# Patient Record
Sex: Female | Born: 1985
Health system: Southern US, Community
[De-identification: ages and names within clinical notes are randomized; demographics above are authoritative.]

## PROBLEM LIST (undated history)

## (undated) DIAGNOSIS — F329 Major depressive disorder, single episode, unspecified: Secondary | ICD-10-CM

## (undated) DIAGNOSIS — R51 Headache: Secondary | ICD-10-CM

## (undated) DIAGNOSIS — R519 Headache, unspecified: Secondary | ICD-10-CM

## (undated) DIAGNOSIS — O24419 Gestational diabetes mellitus in pregnancy, unspecified control: Secondary | ICD-10-CM

## (undated) DIAGNOSIS — M1990 Unspecified osteoarthritis, unspecified site: Secondary | ICD-10-CM

## (undated) DIAGNOSIS — K219 Gastro-esophageal reflux disease without esophagitis: Secondary | ICD-10-CM

## (undated) DIAGNOSIS — R7303 Prediabetes: Secondary | ICD-10-CM

## (undated) DIAGNOSIS — F319 Bipolar disorder, unspecified: Secondary | ICD-10-CM

## (undated) DIAGNOSIS — F419 Anxiety disorder, unspecified: Secondary | ICD-10-CM

## (undated) DIAGNOSIS — F32A Depression, unspecified: Secondary | ICD-10-CM

## (undated) HISTORY — DX: Bipolar disorder, unspecified: F31.9

## (undated) HISTORY — DX: Gestational diabetes mellitus in pregnancy, unspecified control: O24.419

## (undated) HISTORY — PX: WISDOM TOOTH EXTRACTION: SHX21

---
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
## 2008-01-21 ENCOUNTER — Emergency Department (HOSPITAL_COMMUNITY): Admission: EM | Admit: 2008-01-21 | Discharge: 2008-01-21 | Payer: Self-pay | Admitting: Emergency Medicine

## 2008-08-08 ENCOUNTER — Encounter: Admission: RE | Admit: 2008-08-08 | Discharge: 2008-08-08 | Payer: Self-pay | Admitting: Obstetrics and Gynecology

## 2008-09-19 ENCOUNTER — Inpatient Hospital Stay (HOSPITAL_COMMUNITY): Admission: RE | Admit: 2008-09-19 | Discharge: 2008-09-22 | Payer: Self-pay | Admitting: Obstetrics and Gynecology

## 2010-05-10 LAB — GLUCOSE, CAPILLARY
Glucose-Capillary: 127 mg/dL — ABNORMAL HIGH (ref 70–99)
Glucose-Capillary: 158 mg/dL — ABNORMAL HIGH (ref 70–99)
Glucose-Capillary: 70 mg/dL (ref 70–99)
Glucose-Capillary: 73 mg/dL (ref 70–99)
Glucose-Capillary: 79 mg/dL (ref 70–99)
Glucose-Capillary: 81 mg/dL (ref 70–99)
Glucose-Capillary: 85 mg/dL (ref 70–99)
Glucose-Capillary: 88 mg/dL (ref 70–99)
Glucose-Capillary: 94 mg/dL (ref 70–99)

## 2010-05-10 LAB — CBC
HCT: 27.7 % — ABNORMAL LOW (ref 36.0–46.0)
HCT: 34.3 % — ABNORMAL LOW (ref 36.0–46.0)
HCT: 34.7 % — ABNORMAL LOW (ref 36.0–46.0)
Hemoglobin: 11.5 g/dL — ABNORMAL LOW (ref 12.0–15.0)
Hemoglobin: 11.6 g/dL — ABNORMAL LOW (ref 12.0–15.0)
Hemoglobin: 9.3 g/dL — ABNORMAL LOW (ref 12.0–15.0)
MCHC: 33.3 g/dL (ref 30.0–36.0)
MCHC: 33.5 g/dL (ref 30.0–36.0)
MCHC: 33.6 g/dL (ref 30.0–36.0)
MCV: 80.4 fL (ref 78.0–100.0)
MCV: 80.8 fL (ref 78.0–100.0)
MCV: 81.2 fL (ref 78.0–100.0)
Platelets: 137 10*3/uL — ABNORMAL LOW (ref 150–400)
Platelets: 141 10*3/uL — ABNORMAL LOW (ref 150–400)
Platelets: 159 10*3/uL (ref 150–400)
RBC: 3.41 MIL/uL — ABNORMAL LOW (ref 3.87–5.11)
RBC: 4.26 MIL/uL (ref 3.87–5.11)
RBC: 4.29 MIL/uL (ref 3.87–5.11)
RDW: 14.8 % (ref 11.5–15.5)
RDW: 14.8 % (ref 11.5–15.5)
RDW: 15.2 % (ref 11.5–15.5)
WBC: 10.7 10*3/uL — ABNORMAL HIGH (ref 4.0–10.5)
WBC: 12.5 10*3/uL — ABNORMAL HIGH (ref 4.0–10.5)
WBC: 17.9 10*3/uL — ABNORMAL HIGH (ref 4.0–10.5)

## 2010-05-10 LAB — GLUCOSE, RANDOM: Glucose, Bld: 118 mg/dL — ABNORMAL HIGH (ref 70–99)

## 2010-05-10 LAB — RPR: RPR Ser Ql: NONREACTIVE

## 2010-05-15 ENCOUNTER — Ambulatory Visit: Payer: Self-pay | Admitting: Internal Medicine
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
## 2011-02-03 NOTE — L&D Delivery Note (Signed)
Delivery Note   Delivery Note  Arrived to [facility] at 1904 and pt began pushing, vtx was crowning, FHR 130's, Pt pushed well over 1 ctx, after vtx delivered, shoulders noted to be transverse and easily rotated to AP, Mcroberts maneuver and suprapubic pressure applied,   At 7:11 PM a viable female was delivered via Vaginal, Spontaneous Delivery (Presentation: Right  Occiput Anterior). Cord was immediately doubly clamped and cut and infant taken to warmer, NICU team arrived shortly thereafter, APGAR: 5, 9; weight 8 lb 12.6 oz (3985 g).   Placenta status: Intact, Spontaneous. Cord: 3 vessels with the following complications: mild shoulder dystocia, Cord pH: 7.31    Anesthesia: Epidural  Episiotomy: None  Lacerations: Labial, bilateral, hemostatic - not repaired  Suture Repair: n/a  Est. Blood Loss (mL): 250   Mom to postpartum. Baby to nursery-stable. Skin-skin, rooming in  Pt plans to BF  Pt Plans BTL - will be NPO after MN   Dr Normand Sloop notifed   Malissa Hippo  09/16/2011, 8:06 PM

## 2011-03-16 LAB — OB RESULTS CONSOLE ANTIBODY SCREEN: Antibody Screen: NEGATIVE

## 2011-03-16 LAB — OB RESULTS CONSOLE HIV ANTIBODY (ROUTINE TESTING): HIV: NONREACTIVE

## 2011-03-16 LAB — OB RESULTS CONSOLE RUBELLA ANTIBODY, IGM: Rubella: IMMUNE

## 2011-04-20 ENCOUNTER — Encounter (INDEPENDENT_AMBULATORY_CARE_PROVIDER_SITE_OTHER): Payer: 59 | Admitting: Obstetrics and Gynecology

## 2011-04-20 DIAGNOSIS — Z3201 Encounter for pregnancy test, result positive: Secondary | ICD-10-CM

## 2011-04-20 DIAGNOSIS — Z331 Pregnant state, incidental: Secondary | ICD-10-CM

## 2011-04-22 ENCOUNTER — Other Ambulatory Visit (INDEPENDENT_AMBULATORY_CARE_PROVIDER_SITE_OTHER): Payer: 59

## 2011-04-22 DIAGNOSIS — Z331 Pregnant state, incidental: Secondary | ICD-10-CM

## 2011-05-08 ENCOUNTER — Other Ambulatory Visit (INDEPENDENT_AMBULATORY_CARE_PROVIDER_SITE_OTHER): Payer: 59

## 2011-05-08 ENCOUNTER — Encounter: Payer: 59 | Admitting: Obstetrics and Gynecology

## 2011-05-08 ENCOUNTER — Encounter (INDEPENDENT_AMBULATORY_CARE_PROVIDER_SITE_OTHER): Payer: 59 | Admitting: Obstetrics and Gynecology

## 2011-05-08 DIAGNOSIS — Z1389 Encounter for screening for other disorder: Secondary | ICD-10-CM

## 2011-05-08 DIAGNOSIS — Z331 Pregnant state, incidental: Secondary | ICD-10-CM

## 2011-06-10 ENCOUNTER — Encounter: Payer: Self-pay | Admitting: Obstetrics and Gynecology

## 2011-06-10 ENCOUNTER — Ambulatory Visit (INDEPENDENT_AMBULATORY_CARE_PROVIDER_SITE_OTHER): Payer: 59 | Admitting: Obstetrics and Gynecology

## 2011-06-10 VITALS — BP 100/60 | Ht 63.0 in | Wt 150.0 lb

## 2011-06-10 DIAGNOSIS — Z331 Pregnant state, incidental: Secondary | ICD-10-CM

## 2011-06-10 DIAGNOSIS — O09299 Supervision of pregnancy with other poor reproductive or obstetric history, unspecified trimester: Secondary | ICD-10-CM

## 2011-06-10 MED ORDER — GLUCOSE BLOOD VI STRP: ORAL_STRIP | Status: DC

## 2011-06-10 NOTE — Patient Instructions (Signed)
Gestational Diabetes Mellitus Gestational diabetes mellitus (GDM) is diabetes that occurs only during pregnancy. This happens when the body cannot properly handle the glucose (sugar) that increases in the blood after eating. During pregnancy, insulin resistance (reduced sensitivity to insulin) occurs because of the release of hormones from the placenta. Usually, the pancreas of pregnant women produces enough insulin to overcome the resistance that occurs. However, in gestational diabetes, the insulin is there but it does not work effectively. If the resistance is severe enough that the pancreas does not produce enough insulin, extra glucose builds up in the blood.  WHO IS AT RISK FOR DEVELOPING GESTATIONAL DIABETES?  Women with a history of diabetes in the family.   Women over age 25.   Women who are overweight.   Women in certain ethnic groups (Hispanic, African American, Native American, Asian and Pacific Islander).  WHAT CAN HAPPEN TO THE BABY? If the mother's blood glucose is too high while she is pregnant, the extra sugar will travel through the umbilical cord to the baby. Some of the problems the baby may have are:  Large Baby - If the baby receives too much sugar, the baby will gain more weight. This may cause the baby to be too large to be born normally (vaginally) and a Cesarean section (C-section) may be needed.   Low Blood Glucose (hypoglycemia) - The baby makes extra insulin, in response to the extra sugar its gets from its mother. When the baby is born and no longer needs this extra insulin, the baby's blood glucose level may drop.   Jaundice (yellow coloring of the skin and eyes) - This is fairly common in babies. It is caused from a build-up of the chemical called bilirubin. This is rarely serious, but is seen more often in babies whose mothers had gestational diabetes.  RISKS TO THE MOTHER Women who have had gestational diabetes may be at higher risk for some problems,  including:  Preeclampsia or toxemia, which includes problems with high blood pressure. Blood pressure and protein levels in the urine must be checked frequently.   Infections.   Cesarean section (C-section) for delivery.   Developing Type 2 diabetes later in life. About 30-50% will develop diabetes later, especially if obese.  DIAGNOSIS  The hormones that cause insulin resistance are highest at about 24-28 weeks of pregnancy. If symptoms are experienced, they are much like symptoms you would normally expect during pregnancy.  GDM is often diagnosed using a two part method: 1. After 24-28 weeks of pregnancy, the woman drinks a glucose solution and takes a blood test. If the glucose level is high, a second test will be given.  2. Oral Glucose Tolerance Test (OGTT) which is 3 hours long - After not eating overnight, the blood glucose is checked. The woman drinks a glucose solution, and hourly blood glucose tests are taken.  If the woman has risk factors for GDM, the caregiver may test earlier than 24 weeks of pregnancy. TREATMENT  Treatment of GDM is directed at keeping the mother's blood glucose level normal, and may include:  Meal planning.   Taking insulin or other medicine to control your blood glucose level.   Exercise.   Keeping a daily record of the foods you eat.   Blood glucose monitoring and keeping a record of your blood glucose levels.   May monitor ketone levels in the urine, although this is no longer considered necessary in most pregnancies.  HOME CARE INSTRUCTIONS  While you are pregnant:    Follow your caregiver's advice regarding your prenatal appointments, meal planning, exercise, medicines, vitamins, blood and other tests, and physical activities.   Keep a record of your meals, blood glucose tests, and the amount of insulin you are taking (if any). Show this to your caregiver at every prenatal visit.   If you have GDM, you may have problems with hypoglycemia (low  blood glucose). You may suspect this if you become suddenly dizzy, feel shaky, and/or weak. If you think this is happening and you have a glucose meter, try to test your blood glucose level. Follow your caregiver's advice for when and how to treat your low blood glucose. Generally, the 15:15 rule is followed: Treat by consuming 15 grams of carbohydrates, wait 15 minutes, and recheck blood glucose. Examples of 15 grams of carbohydrates are:   1 cup skim or low-fat milk.    cup juice.   3-4 glucose tablets.   5-6 hard candies.   1 small box raisins.    cup regular soda pop.   Practice good hygiene, to avoid infections.   Do not smoke.  SEEK MEDICAL CARE IF:   You develop abnormal vaginal discharge, with or without itching.   You become weak and tired more than expected.   You seem to sweat a lot.   You have a sudden increase in weight, 5 pounds or more in one week.   You are losing weight, 3 pounds or more in a week.   Your blood glucose level is high, and you need instructions on what to do about it.  SEEK IMMEDIATE MEDICAL CARE IF:   You develop a severe headache.   You faint or pass out.   You develop nausea and vomiting.   You become disoriented or confused.   You have a convulsion.   You develop vision problems.   You develop stomach pain.   You develop vaginal bleeding.   You develop uterine contractions.   You have leaking or a gush of fluid from the vagina.  AFTER YOU HAVE THE BABY:  Go to all of your follow-up appointments, and have blood tests as advised by your caregiver.   Maintain a healthy lifestyle, to prevent diabetes in the future. This includes:   Following a healthy meal plan.   Controlling your weight.   Getting enough exercise and proper rest.   Do not smoke.   Breastfeed your baby if you can. This will lower the chance of you and your baby developing diabetes later in life.  For more information about diabetes, go to the American  Diabetes Association at: www.americandiabetesassociation.org. For more information about gestational diabetes, go to the American Congress of Obstetricians and Gynecologists at: www.acog.org. Document Released: 04/27/2000 Document Revised: 01/08/2011 Document Reviewed: 11/19/2008 [employer] Patient Information 2012 [employer]. 

## 2011-06-10 NOTE — Progress Notes (Signed)
Pt c/o left hand going numb radiating threw whole left side.  This happened two weeks ago. No other problem since  Pt states unable to due 3 hr gtt vomited after first drink declines repeat 3 hr gtt.  FBS 94 UEXT 5/5 strength. 2 plus DTRB Pt declined PT or eval will monitor Pt unable to do 3gtt.  Check FBS and 2 hr pp for one week and return with values.  That will determine dx and plan of care

## 2011-06-16 ENCOUNTER — Telehealth: Payer: Self-pay | Admitting: Obstetrics and Gynecology

## 2011-06-16 NOTE — Telephone Encounter (Signed)
Triage/epic 

## 2011-06-17 ENCOUNTER — Encounter: Payer: 59 | Admitting: Obstetrics and Gynecology

## 2011-06-17 NOTE — Telephone Encounter (Signed)
Spoke with pt rgd msg pt states suppose to have appt today to f/u on CBGs pt states haven't received glucometer yet will get it in mail on Tuesday 06/24/11 advised pt ok to sch appt week of 06/29/11 make sure to bring CBGs

## 2011-06-25 ENCOUNTER — Other Ambulatory Visit: Payer: Self-pay | Admitting: Obstetrics and Gynecology

## 2011-07-06 ENCOUNTER — Ambulatory Visit (INDEPENDENT_AMBULATORY_CARE_PROVIDER_SITE_OTHER): Payer: 59 | Admitting: Obstetrics and Gynecology

## 2011-07-06 ENCOUNTER — Encounter: Payer: Self-pay | Admitting: Obstetrics and Gynecology

## 2011-07-06 VITALS — BP 100/58 | Wt 152.0 lb

## 2011-07-06 DIAGNOSIS — O24419 Gestational diabetes mellitus in pregnancy, unspecified control: Secondary | ICD-10-CM

## 2011-07-06 DIAGNOSIS — O99810 Abnormal glucose complicating pregnancy: Secondary | ICD-10-CM

## 2011-07-06 NOTE — Progress Notes (Signed)
Pt without c/o tp may want PPTL R&B reviewed.  Tubal papers signed today GDM pt with FBS 94-144, 2 hr pp 104-126.  Pt to write down BS and test 4x per day.  If still elevated at nv, start on meds [facility] reviewed

## 2011-07-06 NOTE — Patient Instructions (Signed)
Patient Education Materials to be provided at check out (*indicates is located in accordion folder):  Postpartum Sterilization Fetal Movement Counts Patient Name: __________________________________________________ Patient Due Date: ____________________ Melody Haver counts is highly recommended in high risk pregnancies, but it is a good idea for every pregnant woman to do. Start counting fetal movements at 28 weeks of the pregnancy. Fetal movements increase after eating a full meal or eating or drinking something sweet (the blood sugar is higher). It is also important to drink plenty of fluids (well hydrated) before doing the count. Lie on your left side because it helps with the circulation or you can sit in a comfortable chair with your arms over your belly (abdomen) with no distractions around you. DOING THE COUNT  Try to do the count the same time of day each time you do it.   Mark the day and time, then see how long it takes for you to feel 10 movements (kicks, flutters, swishes, rolls). You should have at least 10 movements within 2 hours. You will most likely feel 10 movements in much less than 2 hours. If you do not, wait an hour and count again. After a couple of days you will see a pattern.   What you are looking for is a change in the pattern or not enough counts in 2 hours. Is it taking longer in time to reach 10 movements?  SEEK MEDICAL CARE IF:  You feel less than 10 counts in 2 hours. Tried twice.   No movement in one hour.   The pattern is changing or taking longer each day to reach 10 counts in 2 hours.   You feel the baby is not moving as it usually does.  Date: ____________ Movements: ____________ Start time: ____________ Doreatha Martin time: ____________  Date: ____________ Movements: ____________ Start time: ____________ Doreatha Martin time: ____________ Date: ____________ Movements: ____________ Start time: ____________ Doreatha Martin time: ____________ Date: ____________ Movements: ____________ Start  time: ____________ Doreatha Martin time: ____________ Date: ____________ Movements: ____________ Start time: ____________ Doreatha Martin time: ____________ Date: ____________ Movements: ____________ Start time: ____________ Doreatha Martin time: ____________ Date: ____________ Movements: ____________ Start time: ____________ Doreatha Martin time: ____________ Date: ____________ Movements: ____________ Start time: ____________ Doreatha Martin time: ____________  Date: ____________ Movements: ____________ Start time: ____________ Doreatha Martin time: ____________ Date: ____________ Movements: ____________ Start time: ____________ Doreatha Martin time: ____________ Date: ____________ Movements: ____________ Start time: ____________ Doreatha Martin time: ____________ Date: ____________ Movements: ____________ Start time: ____________ Doreatha Martin time: ____________ Date: ____________ Movements: ____________ Start time: ____________ Doreatha Martin time: ____________ Date: ____________ Movements: ____________ Start time: ____________ Doreatha Martin time: ____________ Date: ____________ Movements: ____________ Start time: ____________ Doreatha Martin time: ____________  Date: ____________ Movements: ____________ Start time: ____________ Doreatha Martin time: ____________ Date: ____________ Movements: ____________ Start time: ____________ Doreatha Martin time: ____________ Date: ____________ Movements: ____________ Start time: ____________ Doreatha Martin time: ____________ Date: ____________ Movements: ____________ Start time: ____________ Doreatha Martin time: ____________ Date: ____________ Movements: ____________ Start time: ____________ Doreatha Martin time: ____________ Date: ____________ Movements: ____________ Start time: ____________ Doreatha Martin time: ____________ Date: ____________ Movements: ____________ Start time: ____________ Doreatha Martin time: ____________  Date: ____________ Movements: ____________ Start time: ____________ Doreatha Martin time: ____________ Date: ____________ Movements: ____________ Start time: ____________ Doreatha Martin time:  ____________ Date: ____________ Movements: ____________ Start time: ____________ Doreatha Martin time: ____________ Date: ____________ Movements: ____________ Start time: ____________ Doreatha Martin time: ____________ Date: ____________ Movements: ____________ Start time: ____________ Doreatha Martin time: ____________ Date: ____________ Movements: ____________ Start time: ____________ Doreatha Martin time: ____________ Date: ____________ Movements: ____________ Start time: ____________ Doreatha Martin time: ____________  Date: ____________ Movements: ____________ Start time: ____________ Doreatha Martin  time: ____________ Date: ____________ Movements: ____________ Start time: ____________ Doreatha Martin time: ____________ Date: ____________ Movements: ____________ Start time: ____________ Doreatha Martin time: ____________ Date: ____________ Movements: ____________ Start time: ____________ Doreatha Martin time: ____________ Date: ____________ Movements: ____________ Start time: ____________ Doreatha Martin time: ____________ Date: ____________ Movements: ____________ Start time: ____________ Doreatha Martin time: ____________ Date: ____________ Movements: ____________ Start time: ____________ Doreatha Martin time: ____________  Date: ____________ Movements: ____________ Start time: ____________ Doreatha Martin time: ____________ Date: ____________ Movements: ____________ Start time: ____________ Doreatha Martin time: ____________ Date: ____________ Movements: ____________ Start time: ____________ Doreatha Martin time: ____________ Date: ____________ Movements: ____________ Start time: ____________ Doreatha Martin time: ____________ Date: ____________ Movements: ____________ Start time: ____________ Doreatha Martin time: ____________ Date: ____________ Movements: ____________ Start time: ____________ Doreatha Martin time: ____________ Date: ____________ Movements: ____________ Start time: ____________ Doreatha Martin time: ____________  Date: ____________ Movements: ____________ Start time: ____________ Doreatha Martin time: ____________ Date: ____________ Movements:  ____________ Start time: ____________ Doreatha Martin time: ____________ Date: ____________ Movements: ____________ Start time: ____________ Doreatha Martin time: ____________ Date: ____________ Movements: ____________ Start time: ____________ Doreatha Martin time: ____________ Date: ____________ Movements: ____________ Start time: ____________ Doreatha Martin time: ____________ Date: ____________ Movements: ____________ Start time: ____________ Doreatha Martin time: ____________ Date: ____________ Movements: ____________ Start time: ____________ Doreatha Martin time: ____________  Date: ____________ Movements: ____________ Start time: ____________ Doreatha Martin time: ____________ Date: ____________ Movements: ____________ Start time: ____________ Doreatha Martin time: ____________ Date: ____________ Movements: ____________ Start time: ____________ Doreatha Martin time: ____________ Date: ____________ Movements: ____________ Start time: ____________ Doreatha Martin time: ____________ Date: ____________ Movements: ____________ Start time: ____________ Doreatha Martin time: ____________ Date: ____________ Movements: ____________ Start time: ____________ Doreatha Martin time: ____________ Document Released: 02/18/2006 Document Revised: 01/08/2011 Document Reviewed: 08/21/2008 [employer] Patient Information 2012 [employer]

## 2011-07-06 NOTE — Progress Notes (Signed)
Pt does not have results for blood sugars but states it usually ranges from 94-136. Pt also needs clarity about when she should check blood sugar.

## 2011-07-16 ENCOUNTER — Ambulatory Visit (INDEPENDENT_AMBULATORY_CARE_PROVIDER_SITE_OTHER): Payer: 59 | Admitting: Obstetrics and Gynecology

## 2011-07-16 VITALS — BP 108/50 | Wt 151.0 lb

## 2011-07-16 DIAGNOSIS — O24419 Gestational diabetes mellitus in pregnancy, unspecified control: Secondary | ICD-10-CM | POA: Insufficient documentation

## 2011-07-16 DIAGNOSIS — Z331 Pregnant state, incidental: Secondary | ICD-10-CM

## 2011-07-16 DIAGNOSIS — O99810 Abnormal glucose complicating pregnancy: Secondary | ICD-10-CM

## 2011-07-16 LAB — GLUCOSE, POCT (MANUAL RESULT ENTRY): POC Glucose: 225 mg/dl — AB (ref 70–99)

## 2011-07-16 MED ORDER — GLYBURIDE 2.5 MG PO TABS: 2.5000 mg | ORAL_TABLET | Freq: Every day | ORAL | Status: DC

## 2011-07-16 NOTE — Progress Notes (Signed)
Poor dietary choices, but is checking CBGs most days FBS and 2 hour PP. FBS 90s-114, 2 hour pp 85-130s. Reviewed necessity of appropriate dietary choices, keeping good records of CBGs. Consulted with SR after patient left office--will start Glyburide 2.5 mg po at dinner. TC to patient to review and Rx.

## 2011-07-16 NOTE — Progress Notes (Signed)
Pt without complaints today Blood glucose- 225 Pt had Honey Nut Cheerios, Cinnamon Toast Crunch, piece of Big Red gum & a Gummy Turtle @ 8:20am Pt forgot blood glucose log @ home

## 2011-07-28 ENCOUNTER — Encounter: Payer: Self-pay | Admitting: Obstetrics and Gynecology

## 2011-07-28 ENCOUNTER — Other Ambulatory Visit: Payer: Self-pay | Admitting: Obstetrics and Gynecology

## 2011-07-28 ENCOUNTER — Ambulatory Visit (INDEPENDENT_AMBULATORY_CARE_PROVIDER_SITE_OTHER): Payer: 59

## 2011-07-28 ENCOUNTER — Other Ambulatory Visit: Payer: 59

## 2011-07-28 ENCOUNTER — Ambulatory Visit (INDEPENDENT_AMBULATORY_CARE_PROVIDER_SITE_OTHER): Payer: 59 | Admitting: Obstetrics and Gynecology

## 2011-07-28 VITALS — BP 102/60 | Wt 150.0 lb

## 2011-07-28 DIAGNOSIS — O24419 Gestational diabetes mellitus in pregnancy, unspecified control: Secondary | ICD-10-CM

## 2011-07-28 DIAGNOSIS — O99810 Abnormal glucose complicating pregnancy: Secondary | ICD-10-CM

## 2011-07-28 NOTE — Progress Notes (Signed)
Blood sugars are still high.  Patient admits that she is not following her diet.  Increase glyburide to 5 mg each day.  Patient told that we will need to begin insulin in 1 week at her sugars are not better control. Ultrasound single gestation, vertex, normal fluid, cervix 3.94 cm, 33 weeks (65th percentile). Return office in one week. Biophysical profile next week. Dr. Stefano Gaul

## 2011-07-28 NOTE — Progress Notes (Signed)
C/O body aches.

## 2011-08-03 ENCOUNTER — Telehealth: Payer: Self-pay | Admitting: Obstetrics and Gynecology

## 2011-08-03 ENCOUNTER — Ambulatory Visit (INDEPENDENT_AMBULATORY_CARE_PROVIDER_SITE_OTHER): Payer: 59

## 2011-08-03 ENCOUNTER — Encounter: Payer: Self-pay | Admitting: Obstetrics and Gynecology

## 2011-08-03 ENCOUNTER — Ambulatory Visit (INDEPENDENT_AMBULATORY_CARE_PROVIDER_SITE_OTHER): Payer: 59 | Admitting: Obstetrics and Gynecology

## 2011-08-03 VITALS — BP 96/62 | Wt 152.0 lb

## 2011-08-03 DIAGNOSIS — O99810 Abnormal glucose complicating pregnancy: Secondary | ICD-10-CM

## 2011-08-03 DIAGNOSIS — O24419 Gestational diabetes mellitus in pregnancy, unspecified control: Secondary | ICD-10-CM

## 2011-08-03 NOTE — Progress Notes (Signed)
Forgot to bring blood sugars.

## 2011-08-03 NOTE — Patient Instructions (Signed)
Diabetes Meal Planning Guide The diabetes meal planning guide is a tool to help you plan your meals and snacks. It is important for people with diabetes to manage their blood glucose (sugar) levels. Choosing the right foods and the right amounts throughout your day will help control your blood glucose. Eating right can even help you improve your blood pressure and reach or maintain a healthy weight. CARBOHYDRATE COUNTING MADE EASY When you eat carbohydrates, they turn to sugar. This raises your blood glucose level. Counting carbohydrates can help you control this level so you feel better. When you plan your meals by counting carbohydrates, you can have more flexibility in what you eat and balance your medicine with your food intake. Carbohydrate counting simply means adding up the total amount of carbohydrate grams in your meals and snacks. Try to eat about the same amount at each meal. Foods with carbohydrates are listed below. Each portion below is 1 carbohydrate serving or 15 grams of carbohydrates. Ask your dietician how many grams of carbohydrates you should eat at each meal or snack. Grains and Starches  1 slice bread.    English muffin or hotdog/hamburger bun.    cup cold cereal (unsweetened).   ? cup cooked pasta or rice.    cup starchy vegetables (corn, potatoes, peas, beans, winter squash).   1 tortilla (6 inches).    bagel.   1 waffle or pancake (size of a CD).    cup cooked cereal.   4 to 6 small crackers.  *Whole grain is recommended. Fruit  1 cup fresh unsweetened berries, melon, papaya, pineapple.   1 small fresh fruit.    banana or mango.    cup fruit juice (4 oz unsweetened).    cup canned fruit in natural juice or water.   2 tbs dried fruit.   12 to 15 grapes or cherries.  Milk and Yogurt  1 cup fat-free or 1% milk.   1 cup soy milk.   6 oz light yogurt with sugar-free sweetener.   6 oz low-fat soy yogurt.   6 oz plain yogurt.   Vegetables  1 cup raw or  cup cooked is counted as 0 carbohydrates or a "free" food.   If you eat 3 or more servings at 1 meal, count them as 1 carbohydrate serving.  Other Carbohydrates   oz chips or pretzels.    cup ice cream or frozen yogurt.    cup sherbet or sorbet.   2 inch square cake, no frosting.   1 tbs honey, sugar, jam, jelly, or syrup.   2 small cookies.   3 squares of graham crackers.   3 cups popcorn.   6 crackers.   1 cup broth-based soup.   Count 1 cup casserole or other mixed foods as 2 carbohydrate servings.   Foods with less than 20 calories in a serving may be counted as 0 carbohydrates or a "free" food.  You may want to purchase a book or computer software that lists the carbohydrate gram counts of different foods. In addition, the nutrition facts panel on the labels of the foods you eat are a good source of this information. The label will tell you how big the serving size is and the total number of carbohydrate grams you will be eating per serving. Divide this number by 15 to obtain the number of carbohydrate servings in a portion. Remember, 1 carbohydrate serving equals 15 grams of carbohydrate. SERVING SIZES Measuring foods and serving sizes helps you   make sure you are getting the right amount of food. The list below tells how big or small some common serving sizes are.  1 oz.........4 stacked dice.   3 oz........Marland KitchenDeck of cards.   1 tsp.......Marland KitchenTip of little finger.   1 tbs......Marland KitchenMarland KitchenThumb.   2 tbs.......Marland KitchenGolf ball.    cup......Marland KitchenHalf of a fist.   1 cup.......Marland KitchenA fist.  SAMPLE DIABETES MEAL PLAN Below is a sample meal plan that includes foods from the grain and starches, dairy, vegetable, fruit, and meat groups. A dietician can individualize a meal plan to fit your calorie needs and tell you the number of servings needed from each food group. However, controlling the total amount of carbohydrates in your meal or snack is more important than  making sure you include all of the food groups at every meal. You may interchange carbohydrate containing foods (dairy, starches, and fruits). The meal plan below is an example of a 2000 calorie diet using carbohydrate counting. This meal plan has 17 carbohydrate servings. Breakfast  1 cup oatmeal (2 carb servings).    cup light yogurt (1 carb serving).   1 cup blueberries (1 carb serving).    cup almonds.  Snack  1 large apple (2 carb servings).   1 low-fat string cheese stick.  Lunch  Chicken breast salad.   1 cup spinach.    cup chopped tomatoes.   2 oz chicken breast, sliced.   2 tbs low-fat [place] dressing.   12 whole-wheat crackers (2 carb servings).   12 to 15 grapes (1 carb serving).   1 cup low-fat milk (1 carb serving).  Snack  1 cup carrots.    cup hummus (1 carb serving).  Dinner  3 oz broiled salmon.   1 cup brown rice (3 carb servings).  Snack  1  cups steamed broccoli (1 carb serving) drizzled with 1 tsp olive oil and lemon juice.   1 cup light pudding (2 carb servings).  DIABETES MEAL PLANNING WORKSHEET Your dietician can use this worksheet to help you decide how many servings of foods and what types of foods are right for you.  BREAKFAST Food Group and Servings / Carb Servings Grain/Starches __________________________________ Dairy __________________________________________ Vegetable ______________________________________ Fruit ___________________________________________ Meat __________________________________________ Fat ____________________________________________ LUNCH Food Group and Servings / Carb Servings Grain/Starches ___________________________________ Dairy ___________________________________________ Fruit ____________________________________________ Meat ___________________________________________ Fat _____________________________________________ Laural Golden Food Group and Servings / Carb Servings Grain/Starches  ___________________________________ Dairy ___________________________________________ Fruit ____________________________________________ Meat ___________________________________________ Fat _____________________________________________ SNACKS Food Group and Servings / Carb Servings Grain/Starches ___________________________________ Dairy ___________________________________________ Vegetable _______________________________________ Fruit ____________________________________________ Meat ___________________________________________ Fat _____________________________________________ DAILY TOTALS Starches _________________________ Vegetable ________________________ Fruit ____________________________ Dairy ____________________________ Meat ____________________________ Fat ______________________________ Document Released: 10/16/2004 Document Revised: 01/08/2011 Document Reviewed: 08/27/2008 [employer] Patient Information 2012 [place], [place].Gestational Diabetes Mellitus Gestational diabetes mellitus (GDM) is diabetes that occurs only during pregnancy. This happens when the body cannot properly handle the glucose (sugar) that increases in the blood after eating. During pregnancy, insulin resistance (reduced sensitivity to insulin) occurs because of the release of hormones from the placenta. Usually, the pancreas of pregnant women produces enough insulin to overcome the resistance that occurs. However, in gestational diabetes, the insulin is there but it does not work effectively. If the resistance is severe enough that the pancreas does not produce enough insulin, extra glucose builds up in the blood.  WHO IS AT RISK FOR DEVELOPING GESTATIONAL DIABETES?  Women with a history of diabetes in the family.   Women over age 76.   Women who are overweight.  Women in certain ethnic groups (Hispanic, African American, Native American, [place] and [place] Islander).  WHAT CAN HAPPEN TO THE BABY? If the  mother's blood glucose is too high while she is pregnant, the extra sugar will travel through the umbilical cord to the baby. Some of the problems the baby may have are:  Large Baby - If the baby receives too much sugar, the baby will gain more weight. This may cause the baby to be too large to be born normally (vaginally) and a Cesarean section (C-section) may be needed.   Low Blood Glucose (hypoglycemia) - The baby makes extra insulin, in response to the extra sugar its gets from its mother. When the baby is born and no longer needs this extra insulin, the baby's blood glucose level may drop.   Jaundice (yellow coloring of the skin and eyes) - This is fairly common in babies. It is caused from a build-up of the chemical called bilirubin. This is rarely serious, but is seen more often in babies whose mothers had gestational diabetes.  RISKS TO THE MOTHER Women who have had gestational diabetes may be at higher risk for some problems, including:  Preeclampsia or toxemia, which includes problems with high blood pressure. Blood pressure and protein levels in the urine must be checked frequently.   Infections.   Cesarean section (C-section) for delivery.   Developing Type 2 diabetes later in life. About 30-50% will develop diabetes later, especially if obese.  DIAGNOSIS  The hormones that cause insulin resistance are highest at about 24-28 weeks of pregnancy. If symptoms are experienced, they are much like symptoms you would normally expect during pregnancy.  GDM is often diagnosed using a two part method: 1. After 24-28 weeks of pregnancy, the woman drinks a glucose solution and takes a blood test. If the glucose level is high, a second test will be given.  2. Oral Glucose Tolerance Test (OGTT) which is 3 hours long - After not eating overnight, the blood glucose is checked. The woman drinks a glucose solution, and hourly blood glucose tests are taken.  If the woman has risk factors for GDM, the  caregiver may test earlier than 24 weeks of pregnancy. TREATMENT  Treatment of GDM is directed at keeping the mother's blood glucose level normal, and may include:  Meal planning.   Taking insulin or other medicine to control your blood glucose level.   Exercise.   Keeping a daily record of the foods you eat.   Blood glucose monitoring and keeping a record of your blood glucose levels.   May monitor ketone levels in the urine, although this is no longer considered necessary in most pregnancies.  HOME CARE INSTRUCTIONS  While you are pregnant:  Follow your caregiver's advice regarding your prenatal appointments, meal planning, exercise, medicines, vitamins, blood and other tests, and physical activities.   Keep a record of your meals, blood glucose tests, and the amount of insulin you are taking (if any). Show this to your caregiver at every prenatal visit.   If you have GDM, you may have problems with hypoglycemia (low blood glucose). You may suspect this if you become suddenly dizzy, feel shaky, and/or weak. If you think this is happening and you have a glucose meter, try to test your blood glucose level. Follow your caregiver's advice for when and how to treat your low blood glucose. Generally, the 15:15 rule is followed: Treat by consuming 15 grams of carbohydrates, wait 15 minutes, and recheck blood glucose. Examples  of 15 grams of carbohydrates are:   1 cup skim or low-fat milk.    cup juice.   3-4 glucose tablets.   5-6 hard candies.   1 small box raisins.    cup regular soda pop.   Practice good hygiene, to avoid infections.   Do not smoke.  SEEK MEDICAL CARE IF:   You develop abnormal vaginal discharge, with or without itching.   You become weak and tired more than expected.   You seem to sweat a lot.   You have a sudden increase in weight, 5 pounds or more in one week.   You are losing weight, 3 pounds or more in a week.   Your blood glucose level is  high, and you need instructions on what to do about it.  SEEK IMMEDIATE MEDICAL CARE IF:   You develop a severe headache.   You faint or pass out.   You develop nausea and vomiting.   You become disoriented or confused.   You have a convulsion.   You develop vision problems.   You develop stomach pain.   You develop vaginal bleeding.   You develop uterine contractions.   You have leaking or a gush of fluid from the vagina.  AFTER YOU HAVE THE BABY:  Go to all of your follow-up appointments, and have blood tests as advised by your caregiver.   Maintain a healthy lifestyle, to prevent diabetes in the future. This includes:   Following a healthy meal plan.   Controlling your weight.   Getting enough exercise and proper rest.   Do not smoke.   Breastfeed your baby if you can. This will lower the chance of you and your baby developing diabetes later in life.  For more information about diabetes, go to the American Diabetes Association at: PMFashions.com.cy. For more information about gestational diabetes, go to the [employer] at: RentRule.com.au. Document Released: 04/27/2000 Document Revised: 01/08/2011 Document Reviewed: 11/19/2008 [facility] Patient Information 2012 [place], [place].Preventing Preterm Labor Preterm labor is when a pregnant woman has contractions that cause the cervix to open, shorten, and thin before 37 weeks of pregnancy. You will have regular contractions (tightening) 2 to 3 minutes apart. This usually causes discomfort or pain. HOME CARE  Eat a healthy diet.   Take your vitamins as told by your doctor.   Drink enough fluids to keep your pee (urine) clear or pale yellow every day.   Get rest and sleep.   Do not have sex if you are at high risk for preterm labor.   Follow your doctor's advice about activity, medicines, and tests.   Avoid stress.   Avoid hard labor or exercise that lasts for  a long time.   Do not smoke.  GET HELP RIGHT AWAY IF:   You are having contractions.   You have belly (abdominal) pain.   You have bleeding from your vagina.   You have pain when you pee (urinate).   You have abnormal discharge from your vagina.   You have a temperature by mouth above 102 F (38.9 C).  MAKE SURE YOU:  Understand these instructions.   Will watch your condition.   Will get help if you are not doing well or get worse.  Document Released: 04/17/2008 Document Revised: 01/08/2011 Document Reviewed: 04/17/2008 [facility] Patient Information 2012 [place], [place].Fetal Movement Counts Patient Name: __________________________________________________ Patient Due Date: ____________________ Melody Haver counts is highly recommended in high risk pregnancies, but it is a good  idea for every pregnant woman to do. Start counting fetal movements at 28 weeks of the pregnancy. Fetal movements increase after eating a full meal or eating or drinking something sweet (the blood sugar is higher). It is also important to drink plenty of fluids (well hydrated) before doing the count. Lie on your left side because it helps with the circulation or you can sit in a comfortable chair with your arms over your belly (abdomen) with no distractions around you. DOING THE COUNT  Try to do the count the same time of day each time you do it.   Mark the day and time, then see how long it takes for you to feel 10 movements (kicks, flutters, swishes, rolls). You should have at least 10 movements within 2 hours. You will most likely feel 10 movements in much less than 2 hours. If you do not, wait an hour and count again. After a couple of days you will see a pattern.   What you are looking for is a change in the pattern or not enough counts in 2 hours. Is it taking longer in time to reach 10 movements?  SEEK MEDICAL CARE IF:  You feel less than 10 counts in 2 hours. Tried twice.   No movement in one hour.   The  pattern is changing or taking longer each day to reach 10 counts in 2 hours.   You feel the baby is not moving as it usually does.  Date: ____________ Movements: ____________ Start time: ____________ Doreatha Martin time: ____________  Date: ____________ Movements: ____________ Start time: ____________ Doreatha Martin time: ____________ Date: ____________ Movements: ____________ Start time: ____________ Doreatha Martin time: ____________ Date: ____________ Movements: ____________ Start time: ____________ Doreatha Martin time: ____________ Date: ____________ Movements: ____________ Start time: ____________ Doreatha Martin time: ____________ Date: ____________ Movements: ____________ Start time: ____________ Doreatha Martin time: ____________ Date: ____________ Movements: ____________ Start time: ____________ Doreatha Martin time: ____________ Date: ____________ Movements: ____________ Start time: ____________ Doreatha Martin time: ____________  Date: ____________ Movements: ____________ Start time: ____________ Doreatha Martin time: ____________ Date: ____________ Movements: ____________ Start time: ____________ Doreatha Martin time: ____________ Date: ____________ Movements: ____________ Start time: ____________ Doreatha Martin time: ____________ Date: ____________ Movements: ____________ Start time: ____________ Doreatha Martin time: ____________ Date: ____________ Movements: ____________ Start time: ____________ Doreatha Martin time: ____________ Date: ____________ Movements: ____________ Start time: ____________ Doreatha Martin time: ____________ Date: ____________ Movements: ____________ Start time: ____________ Doreatha Martin time: ____________  Date: ____________ Movements: ____________ Start time: ____________ Doreatha Martin time: ____________ Date: ____________ Movements: ____________ Start time: ____________ Doreatha Martin time: ____________ Date: ____________ Movements: ____________ Start time: ____________ Doreatha Martin time: ____________ Date: ____________ Movements: ____________ Start time: ____________ Doreatha Martin time: ____________ Date:  ____________ Movements: ____________ Start time: ____________ Doreatha Martin time: ____________ Date: ____________ Movements: ____________ Start time: ____________ Doreatha Martin time: ____________ Date: ____________ Movements: ____________ Start time: ____________ Doreatha Martin time: ____________  Date: ____________ Movements: ____________ Start time: ____________ Doreatha Martin time: ____________ Date: ____________ Movements: ____________ Start time: ____________ Doreatha Martin time: ____________ Date: ____________ Movements: ____________ Start time: ____________ Doreatha Martin time: ____________ Date: ____________ Movements: ____________ Start time: ____________ Doreatha Martin time: ____________ Date: ____________ Movements: ____________ Start time: ____________ Doreatha Martin time: ____________ Date: ____________ Movements: ____________ Start time: ____________ Doreatha Martin time: ____________ Date: ____________ Movements: ____________ Start time: ____________ Doreatha Martin time: ____________  Date: ____________ Movements: ____________ Start time: ____________ Doreatha Martin time: ____________ Date: ____________ Movements: ____________ Start time: ____________ Doreatha Martin time: ____________ Date: ____________ Movements: ____________ Start time: ____________ Doreatha Martin time: ____________ Date: ____________ Movements: ____________ Start time: ____________ Doreatha Martin time: ____________ Date: ____________ Movements: ____________ Start time: ____________ Doreatha Martin time: ____________  Date: ____________ Movements: ____________ Start time: ____________ Doreatha Martin time: ____________ Date: ____________ Movements: ____________ Start time: ____________ Doreatha Martin time: ____________  Date: ____________ Movements: ____________ Start time: ____________ Doreatha Martin time: ____________ Date: ____________ Movements: ____________ Start time: ____________ Doreatha Martin time: ____________ Date: ____________ Movements: ____________ Start time: ____________ Doreatha Martin time: ____________ Date: ____________ Movements: ____________ Start  time: ____________ Doreatha Martin time: ____________ Date: ____________ Movements: ____________ Start time: ____________ Doreatha Martin time: ____________ Date: ____________ Movements: ____________ Start time: ____________ Doreatha Martin time: ____________ Date: ____________ Movements: ____________ Start time: ____________ Doreatha Martin time: ____________  Date: ____________ Movements: ____________ Start time: ____________ Doreatha Martin time: ____________ Date: ____________ Movements: ____________ Start time: ____________ Doreatha Martin time: ____________ Date: ____________ Movements: ____________ Start time: ____________ Doreatha Martin time: ____________ Date: ____________ Movements: ____________ Start time: ____________ Doreatha Martin time: ____________ Date: ____________ Movements: ____________ Start time: ____________ Doreatha Martin time: ____________ Date: ____________ Movements: ____________ Start time: ____________ Doreatha Martin time: ____________ Date: ____________ Movements: ____________ Start time: ____________ Doreatha Martin time: ____________  Date: ____________ Movements: ____________ Start time: ____________ Doreatha Martin time: ____________ Date: ____________ Movements: ____________ Start time: ____________ Doreatha Martin time: ____________ Date: ____________ Movements: ____________ Start time: ____________ Doreatha Martin time: ____________ Date: ____________ Movements: ____________ Start time: ____________ Doreatha Martin time: ____________ Date: ____________ Movements: ____________ Start time: ____________ Doreatha Martin time: ____________ Date: ____________ Movements: ____________ Start time: ____________ Doreatha Martin time: ____________ Document Released: 02/18/2006 Document Revised: 01/08/2011 Document Reviewed: 08/21/2008 [employer] Patient Information 2012 [place].

## 2011-08-03 NOTE — Telephone Encounter (Signed)
Called pt to instruct to take glyburide just before supper and to schedule 2x/week NST. States she has [place] sched for 7-9 w [facility].

## 2011-08-03 NOTE — Progress Notes (Signed)
[redacted]w[redacted]d BPP 8/8 GDM Reports that FBS are 112-118, 2hrPP are <120, but did not bring paper today,   Currently taking glyburide 5mg , states she's taking it about 10pm.  Admitted to not eating a GDM diet, rv'd recommendations, pt could benefit from nutrition class rv'd w Dr Estanislado Pandy Will instruct pt to take glyburide before dinner 5-6pm And bring logbook Will plan 2x/week NST (if not getting BPP) Growth [place] w BPP [place] Will have triage call pt w information regarding nutrition class  rv'd PTL sx's and [facility]

## 2011-08-04 ENCOUNTER — Telehealth: Payer: Self-pay | Admitting: Obstetrics and Gynecology

## 2011-08-04 ENCOUNTER — Other Ambulatory Visit: Payer: Self-pay | Admitting: Obstetrics and Gynecology

## 2011-08-04 DIAGNOSIS — O24419 Gestational diabetes mellitus in pregnancy, unspecified control: Secondary | ICD-10-CM

## 2011-08-04 LAB — US OB FOLLOW UP

## 2011-08-04 NOTE — Telephone Encounter (Signed)
TC to pt re: referral to [facility].  Sched for class 08/05/11 and insulin teaching 79/13.  Unable to attend either appt due to work schedule.  Phone # given to pt to reschedule appts.

## 2011-08-05 ENCOUNTER — Telehealth: Payer: Self-pay | Admitting: Obstetrics and Gynecology

## 2011-08-05 NOTE — Telephone Encounter (Signed)
/  TC from [facility].  Pt was scheduled for class today, but did not keep appt. When called states she was aware of appt and was going to call to R/S but forgot.  Also states cannot keep appt for insulin teaching 08/11/11.  Wants to discuss further at [facility] 08/11/11. Will not be available for appt with [facility] until 08/19/11.

## 2011-08-10 ENCOUNTER — Telehealth: Payer: Self-pay | Admitting: Obstetrics and Gynecology

## 2011-08-10 ENCOUNTER — Ambulatory Visit (INDEPENDENT_AMBULATORY_CARE_PROVIDER_SITE_OTHER): Payer: 59 | Admitting: Obstetrics and Gynecology

## 2011-08-10 ENCOUNTER — Encounter: Payer: Self-pay | Admitting: Obstetrics and Gynecology

## 2011-08-10 ENCOUNTER — Telehealth: Payer: Self-pay

## 2011-08-10 VITALS — BP 98/60 | Wt 154.0 lb

## 2011-08-10 DIAGNOSIS — R109 Unspecified abdominal pain: Secondary | ICD-10-CM

## 2011-08-10 LAB — CBC
HCT: 34.3 % — ABNORMAL LOW (ref 36.0–46.0)
Hemoglobin: 11.4 g/dL — ABNORMAL LOW (ref 12.0–15.0)
MCH: 24.9 pg — ABNORMAL LOW (ref 26.0–34.0)
MCHC: 33.2 g/dL (ref 30.0–36.0)
MCV: 74.9 fL — ABNORMAL LOW (ref 78.0–100.0)
RBC: 4.58 MIL/uL (ref 3.87–5.11)
RDW: 15.3 % (ref 11.5–15.5)

## 2011-08-10 LAB — COMPREHENSIVE METABOLIC PANEL
ALT: 16 U/L (ref 0–35)
Albumin: 3.4 g/dL — ABNORMAL LOW (ref 3.5–5.2)
Alkaline Phosphatase: 119 U/L — ABNORMAL HIGH (ref 39–117)
BUN: 5 mg/dL — ABNORMAL LOW (ref 6–23)
Calcium: 8.3 mg/dL — ABNORMAL LOW (ref 8.4–10.5)
Chloride: 106 mEq/L (ref 96–112)
Creat: 0.61 mg/dL (ref 0.50–1.10)
Glucose, Bld: 86 mg/dL (ref 70–99)
Potassium: 4.1 mEq/L (ref 3.5–5.3)
Sodium: 137 mEq/L (ref 135–145)
Total Bilirubin: 0.3 mg/dL (ref 0.3–1.2)
Total Protein: 6.4 g/dL (ref 6.0–8.3)

## 2011-08-10 LAB — LIPASE: Lipase: 35 U/L (ref 0–75)

## 2011-08-10 NOTE — Telephone Encounter (Signed)
Jasmine Huerta/OB °

## 2011-08-10 NOTE — Progress Notes (Signed)
R side abdominal stabbing pain causing light headedness & dizziness

## 2011-08-10 NOTE — Telephone Encounter (Signed)
TC from pt. States since 08/07/11 has had  Sharp pain under rt side of ribs that radiates across abd.  Sometimes is jsut across mid-abd.  Is constant and only goes away when sleeping.  Has been drinking 60+ oz water.  States  Decreased FM but when questioned further states no FM since awoke at 8am.  Has had banana and almonds for breakfast.  Per VL and DD pt to office for eval.

## 2011-08-10 NOTE — Progress Notes (Signed)
States that she has been having Right Upper Quadrant  Pain starting last Friday night. It was slightly relieved by lying down. But was 2 -3 hours after eating food. The Right UQ pain returned on Saturday evening again late evening.  Sunday night Rt UQ pain returned late evening 2 - 3 hrs after eating.  Examination: Fetal Lie: Longitudinal, LOT and feet under ribs rt side Rt Upper Quadrant examination. No rebound tenderness - just states that felt pressure.  Urine: neg FM++ during examination of abdomen All other quadrants neg  Impression: ? Cholestasis or Gallstones due to association with ingestion of food                      ? Fetal kicking  Plan: CBC, CMP, Amylase and Lypase           08/11/11 - had Growth USS and add on rt Upper Quadrant USS to r/o Gallstones/ Cholestasis

## 2011-08-10 NOTE — Telephone Encounter (Signed)
Spoke with pt informing her u/s for her R upper quad pain has to be done at the hospital. Per Olegario Messier [facility] can't do gastro u/s on pregnant patients by law. Pt states she is not able to go to the hospital for ultrasound due to her work schedule. Will inform Dee & pt states she will discuss this matter with Geraldine Contras tomorrow at her appointment.

## 2011-08-11 ENCOUNTER — Ambulatory Visit (INDEPENDENT_AMBULATORY_CARE_PROVIDER_SITE_OTHER): Payer: 59 | Admitting: Obstetrics and Gynecology

## 2011-08-11 ENCOUNTER — Ambulatory Visit: Payer: Self-pay | Admitting: *Deleted

## 2011-08-11 ENCOUNTER — Ambulatory Visit (INDEPENDENT_AMBULATORY_CARE_PROVIDER_SITE_OTHER): Payer: 59

## 2011-08-11 ENCOUNTER — Encounter: Payer: Self-pay | Admitting: Obstetrics and Gynecology

## 2011-08-11 VITALS — BP 100/60 | Wt 152.0 lb

## 2011-08-11 DIAGNOSIS — O3660X0 Maternal care for excessive fetal growth, unspecified trimester, not applicable or unspecified: Secondary | ICD-10-CM

## 2011-08-11 DIAGNOSIS — O99810 Abnormal glucose complicating pregnancy: Secondary | ICD-10-CM

## 2011-08-11 DIAGNOSIS — Z331 Pregnant state, incidental: Secondary | ICD-10-CM

## 2011-08-11 DIAGNOSIS — Z3490 Encounter for supervision of normal pregnancy, unspecified, unspecified trimester: Secondary | ICD-10-CM

## 2011-08-11 DIAGNOSIS — Z1389 Encounter for screening for other disorder: Secondary | ICD-10-CM

## 2011-08-11 DIAGNOSIS — O24419 Gestational diabetes mellitus in pregnancy, unspecified control: Secondary | ICD-10-CM

## 2011-08-11 NOTE — Progress Notes (Signed)
No complaints. Pt forgot to bring copy of blood sugars. Pt states she will fax them over.

## 2011-08-25 ENCOUNTER — Other Ambulatory Visit: Payer: Self-pay | Admitting: Obstetrics and Gynecology

## 2011-08-25 ENCOUNTER — Ambulatory Visit (INDEPENDENT_AMBULATORY_CARE_PROVIDER_SITE_OTHER): Payer: 59

## 2011-08-25 ENCOUNTER — Ambulatory Visit (INDEPENDENT_AMBULATORY_CARE_PROVIDER_SITE_OTHER): Payer: 59 | Admitting: Obstetrics and Gynecology

## 2011-08-25 ENCOUNTER — Encounter: Payer: Self-pay | Admitting: Obstetrics and Gynecology

## 2011-08-25 VITALS — BP 104/58 | Wt 155.0 lb

## 2011-08-25 DIAGNOSIS — Z91148 Patient's other noncompliance with medication regimen for other reason: Secondary | ICD-10-CM | POA: Insufficient documentation

## 2011-08-25 DIAGNOSIS — O24419 Gestational diabetes mellitus in pregnancy, unspecified control: Secondary | ICD-10-CM

## 2011-08-25 DIAGNOSIS — Z9114 Patient's other noncompliance with medication regimen: Secondary | ICD-10-CM | POA: Insufficient documentation

## 2011-08-25 DIAGNOSIS — O09299 Supervision of pregnancy with other poor reproductive or obstetric history, unspecified trimester: Secondary | ICD-10-CM

## 2011-08-25 DIAGNOSIS — Z9111 Patient's noncompliance with dietary regimen: Secondary | ICD-10-CM | POA: Insufficient documentation

## 2011-08-25 DIAGNOSIS — Z91199 Patient's noncompliance with other medical treatment and regimen due to unspecified reason: Secondary | ICD-10-CM

## 2011-08-25 DIAGNOSIS — O99810 Abnormal glucose complicating pregnancy: Secondary | ICD-10-CM

## 2011-08-25 DIAGNOSIS — Z9119 Patient's noncompliance with other medical treatment and regimen: Secondary | ICD-10-CM

## 2011-08-25 DIAGNOSIS — Z91119 Patient's noncompliance with dietary regimen due to unspecified reason: Secondary | ICD-10-CM | POA: Insufficient documentation

## 2011-08-25 MED ORDER — GLYBURIDE 2.5 MG PO TABS: 5.0000 mg | ORAL_TABLET | Freq: Every day | ORAL | Status: DC

## 2011-08-25 NOTE — Progress Notes (Signed)
Pt. Stated no issues today.  CBGs:  FBS: 70-120   2hr:78-200 Pt admits noncompliance with diet, medication and CBG testing.  Says she's too busy or has cravings that are not on diet.  Ultrasound: EFW:  7LB 76%ILE FLUID:   nl @ 70th %ile Vertex:    GC, CHL, GBS done.   Poorly controlled GDM.  Schedule induction at 39 wks. BPP weekly, NST this week Pt was to have started Glyburide 5 mg 2 weeks ago.  Order entered.

## 2011-08-26 ENCOUNTER — Telehealth: Payer: Self-pay | Admitting: Obstetrics and Gynecology

## 2011-08-26 ENCOUNTER — Telehealth: Payer: Self-pay

## 2011-08-26 DIAGNOSIS — O24419 Gestational diabetes mellitus in pregnancy, unspecified control: Secondary | ICD-10-CM

## 2011-08-26 NOTE — Telephone Encounter (Signed)
Induction scheduled for 09/16/11 @ 7:00am with MK/ND.  Jasmine Huerta

## 2011-08-26 NOTE — Telephone Encounter (Signed)
Tc to pt per vph recs. Pt needs an NST this week and BPP next week due to dx of GDM. Appt sched 08/28/11@1 :00p for NST only. BPP sched 09/04/11@12 :00p and fu with ND @ 1:15p. Pt voices understanding and agrees.

## 2011-08-26 NOTE — Telephone Encounter (Signed)
Induction scheduled for 09/16/11 @ 7:00 with MK/ND.  Jasmine Huerta

## 2011-08-27 LAB — US OB FOLLOW UP

## 2011-08-27 LAB — STREP B DNA PROBE: GBSP: NEGATIVE

## 2011-08-28 ENCOUNTER — Ambulatory Visit (INDEPENDENT_AMBULATORY_CARE_PROVIDER_SITE_OTHER): Payer: 59 | Admitting: Obstetrics and Gynecology

## 2011-08-28 DIAGNOSIS — O24419 Gestational diabetes mellitus in pregnancy, unspecified control: Secondary | ICD-10-CM

## 2011-08-28 DIAGNOSIS — O99810 Abnormal glucose complicating pregnancy: Secondary | ICD-10-CM

## 2011-08-30 NOTE — Progress Notes (Signed)
NST only    NST reactive

## 2011-08-31 ENCOUNTER — Telehealth (HOSPITAL_COMMUNITY): Payer: Self-pay | Admitting: *Deleted

## 2011-08-31 NOTE — Telephone Encounter (Signed)
Preadmission screen  

## 2011-09-04 ENCOUNTER — Ambulatory Visit (INDEPENDENT_AMBULATORY_CARE_PROVIDER_SITE_OTHER): Payer: 59 | Admitting: Obstetrics and Gynecology

## 2011-09-04 ENCOUNTER — Ambulatory Visit (INDEPENDENT_AMBULATORY_CARE_PROVIDER_SITE_OTHER): Payer: 59

## 2011-09-04 ENCOUNTER — Encounter: Payer: Self-pay | Admitting: Obstetrics and Gynecology

## 2011-09-04 VITALS — BP 112/64 | Wt 156.0 lb

## 2011-09-04 DIAGNOSIS — O24419 Gestational diabetes mellitus in pregnancy, unspecified control: Secondary | ICD-10-CM

## 2011-09-04 DIAGNOSIS — O99810 Abnormal glucose complicating pregnancy: Secondary | ICD-10-CM

## 2011-09-04 NOTE — Progress Notes (Signed)
Pt c/o cramping, states that she did not bring blood sugar documentation. Pt states FBS is usually 60-89.

## 2011-09-04 NOTE — Progress Notes (Signed)
A/P GBS negative Fetal kick counts reviewed Labor reviewed with pt All patients  questions answered BPP 8/8 with normal fluid and vertex RT one week for BPP and EFW

## 2011-09-04 NOTE — Patient Instructions (Signed)
Fetal Movement Counts Patient Name: __________________________________________________ Patient Due Date: ____________________ Kick counts is highly recommended in high risk pregnancies, but it is a good idea for every pregnant woman to do. Start counting fetal movements at 28 weeks of the pregnancy. Fetal movements increase after eating a full meal or eating or drinking something sweet (the blood sugar is higher). It is also important to drink plenty of fluids (well hydrated) before doing the count. Lie on your left side because it helps with the circulation or you can sit in a comfortable chair with your arms over your belly (abdomen) with no distractions around you. DOING THE COUNT  Try to do the count the same time of day each time you do it.   Mark the day and time, then see how long it takes for you to feel 10 movements (kicks, flutters, swishes, rolls). You should have at least 10 movements within 2 hours. You will most likely feel 10 movements in much less than 2 hours. If you do not, wait an hour and count again. After a couple of days you will see a pattern.   What you are looking for is a change in the pattern or not enough counts in 2 hours. Is it taking longer in time to reach 10 movements?  SEEK MEDICAL CARE IF:  You feel less than 10 counts in 2 hours. Tried twice.   No movement in one hour.   The pattern is changing or taking longer each day to reach 10 counts in 2 hours.   You feel the baby is not moving as it usually does.  Date: ____________ Movements: ____________ Start time: ____________ Finish time: ____________  Date: ____________ Movements: ____________ Start time: ____________ Finish time: ____________ Date: ____________ Movements: ____________ Start time: ____________ Finish time: ____________ Date: ____________ Movements: ____________ Start time: ____________ Finish time: ____________ Date: ____________ Movements: ____________ Start time: ____________ Finish time:  ____________ Date: ____________ Movements: ____________ Start time: ____________ Finish time: ____________ Date: ____________ Movements: ____________ Start time: ____________ Finish time: ____________ Date: ____________ Movements: ____________ Start time: ____________ Finish time: ____________  Date: ____________ Movements: ____________ Start time: ____________ Finish time: ____________ Date: ____________ Movements: ____________ Start time: ____________ Finish time: ____________ Date: ____________ Movements: ____________ Start time: ____________ Finish time: ____________ Date: ____________ Movements: ____________ Start time: ____________ Finish time: ____________ Date: ____________ Movements: ____________ Start time: ____________ Finish time: ____________ Date: ____________ Movements: ____________ Start time: ____________ Finish time: ____________ Date: ____________ Movements: ____________ Start time: ____________ Finish time: ____________  Date: ____________ Movements: ____________ Start time: ____________ Finish time: ____________ Date: ____________ Movements: ____________ Start time: ____________ Finish time: ____________ Date: ____________ Movements: ____________ Start time: ____________ Finish time: ____________ Date: ____________ Movements: ____________ Start time: ____________ Finish time: ____________ Date: ____________ Movements: ____________ Start time: ____________ Finish time: ____________ Date: ____________ Movements: ____________ Start time: ____________ Finish time: ____________ Date: ____________ Movements: ____________ Start time: ____________ Finish time: ____________  Date: ____________ Movements: ____________ Start time: ____________ Finish time: ____________ Date: ____________ Movements: ____________ Start time: ____________ Finish time: ____________ Date: ____________ Movements: ____________ Start time: ____________ Finish time: ____________ Date: ____________ Movements:  ____________ Start time: ____________ Finish time: ____________ Date: ____________ Movements: ____________ Start time: ____________ Finish time: ____________ Date: ____________ Movements: ____________ Start time: ____________ Finish time: ____________ Date: ____________ Movements: ____________ Start time: ____________ Finish time: ____________  Date: ____________ Movements: ____________ Start time: ____________ Finish time: ____________ Date: ____________ Movements: ____________ Start time: ____________ Finish time: ____________ Date: ____________ Movements: ____________ Start time:   ____________ Finish time: ____________ Date: ____________ Movements: ____________ Start time: ____________ Finish time: ____________ Date: ____________ Movements: ____________ Start time: ____________ Finish time: ____________ Date: ____________ Movements: ____________ Start time: ____________ Finish time: ____________ Date: ____________ Movements: ____________ Start time: ____________ Finish time: ____________  Date: ____________ Movements: ____________ Start time: ____________ Finish time: ____________ Date: ____________ Movements: ____________ Start time: ____________ Finish time: ____________ Date: ____________ Movements: ____________ Start time: ____________ Finish time: ____________ Date: ____________ Movements: ____________ Start time: ____________ Finish time: ____________ Date: ____________ Movements: ____________ Start time: ____________ Finish time: ____________ Date: ____________ Movements: ____________ Start time: ____________ Finish time: ____________ Date: ____________ Movements: ____________ Start time: ____________ Finish time: ____________  Date: ____________ Movements: ____________ Start time: ____________ Finish time: ____________ Date: ____________ Movements: ____________ Start time: ____________ Finish time: ____________ Date: ____________ Movements: ____________ Start time: ____________ Finish  time: ____________ Date: ____________ Movements: ____________ Start time: ____________ Finish time: ____________ Date: ____________ Movements: ____________ Start time: ____________ Finish time: ____________ Date: ____________ Movements: ____________ Start time: ____________ Finish time: ____________ Date: ____________ Movements: ____________ Start time: ____________ Finish time: ____________  Date: ____________ Movements: ____________ Start time: ____________ Finish time: ____________ Date: ____________ Movements: ____________ Start time: ____________ Finish time: ____________ Date: ____________ Movements: ____________ Start time: ____________ Finish time: ____________ Date: ____________ Movements: ____________ Start time: ____________ Finish time: ____________ Date: ____________ Movements: ____________ Start time: ____________ Finish time: ____________ Date: ____________ Movements: ____________ Start time: ____________ Finish time: ____________ Document Released: 02/18/2006 Document Revised: 01/08/2011 Document Reviewed: 08/21/2008 [employer] Patient Information 2012 [employer]. 

## 2011-09-08 ENCOUNTER — Telehealth: Payer: Self-pay | Admitting: Obstetrics and Gynecology

## 2011-09-08 ENCOUNTER — Other Ambulatory Visit: Payer: Self-pay | Admitting: Obstetrics and Gynecology

## 2011-09-08 DIAGNOSIS — O24419 Gestational diabetes mellitus in pregnancy, unspecified control: Secondary | ICD-10-CM

## 2011-09-08 NOTE — Telephone Encounter (Signed)
Tc to pt regarding msg, lm on vm to call back. 

## 2011-09-08 NOTE — Telephone Encounter (Signed)
TRIAGE/GENERAL QUEST. °

## 2011-09-09 ENCOUNTER — Encounter: Payer: Self-pay | Admitting: Obstetrics and Gynecology

## 2011-09-09 ENCOUNTER — Ambulatory Visit (INDEPENDENT_AMBULATORY_CARE_PROVIDER_SITE_OTHER): Payer: 59 | Admitting: Obstetrics and Gynecology

## 2011-09-09 VITALS — BP 104/70 | Wt 158.0 lb

## 2011-09-09 DIAGNOSIS — Z331 Pregnant state, incidental: Secondary | ICD-10-CM

## 2011-09-09 DIAGNOSIS — Z3490 Encounter for supervision of normal pregnancy, unspecified, unspecified trimester: Secondary | ICD-10-CM

## 2011-09-09 NOTE — Progress Notes (Signed)
No complaints. cx check. Pt forgot blood sugars.

## 2011-09-09 NOTE — Progress Notes (Signed)
Patient ID: Jasmine Huerta, female   DOB: [birth year], 26 y.o.   MRN: [id number] Will fax blood sugars Reviewed s/s uc, srom, vag bleeding, daily fetal kick counts to report, comfort measures. Encouragged 8 water daily and frequent voids. Lavera Guise, CNM

## 2011-09-16 ENCOUNTER — Encounter (HOSPITAL_COMMUNITY): Payer: Self-pay

## 2011-09-16 ENCOUNTER — Encounter (HOSPITAL_COMMUNITY): Payer: Self-pay | Admitting: Anesthesiology

## 2011-09-16 ENCOUNTER — Inpatient Hospital Stay (HOSPITAL_COMMUNITY)
Admission: RE | Admit: 2011-09-16 | Discharge: 2011-09-18 | DRG: 767 | Disposition: A | Discharge status: Home / Self Care | Payer: 59 | Source: Ambulatory Visit | Attending: Obstetrics and Gynecology | Admitting: Obstetrics and Gynecology

## 2011-09-16 ENCOUNTER — Inpatient Hospital Stay (HOSPITAL_COMMUNITY): Payer: 59 | Admitting: Anesthesiology

## 2011-09-16 VITALS — BP 116/74 | HR 53 | Temp 98.0°F | Resp 18 | Ht 63.0 in | Wt 156.0 lb

## 2011-09-16 DIAGNOSIS — O2432 Unspecified pre-existing diabetes mellitus in childbirth: Secondary | ICD-10-CM

## 2011-09-16 DIAGNOSIS — O24419 Gestational diabetes mellitus in pregnancy, unspecified control: Secondary | ICD-10-CM

## 2011-09-16 DIAGNOSIS — Z302 Encounter for sterilization: Secondary | ICD-10-CM

## 2011-09-16 DIAGNOSIS — O99814 Abnormal glucose complicating childbirth: Principal | ICD-10-CM | POA: Diagnosis present

## 2011-09-16 LAB — ABO/RH: ABO/RH(D): O POS

## 2011-09-16 LAB — TYPE AND SCREEN

## 2011-09-16 LAB — CBC
HCT: 34.6 % — ABNORMAL LOW (ref 36.0–46.0)
Hemoglobin: 11.1 g/dL — ABNORMAL LOW (ref 12.0–15.0)
MCH: 25.2 pg — ABNORMAL LOW (ref 26.0–34.0)
MCV: 78.5 fL (ref 78.0–100.0)
RBC: 4.41 MIL/uL (ref 3.87–5.11)

## 2011-09-16 LAB — GLUCOSE, CAPILLARY: Glucose-Capillary: 116 mg/dL — ABNORMAL HIGH (ref 70–99)

## 2011-09-16 LAB — OB RESULTS CONSOLE GBS: GBS: NEGATIVE

## 2011-09-16 MED ORDER — PHENYLEPHRINE 40 MCG/ML (10ML) SYRINGE FOR IV PUSH (FOR BLOOD PRESSURE SUPPORT): 80.0000 ug | PREFILLED_SYRINGE | INTRAVENOUS | Status: DC | PRN

## 2011-09-16 MED ORDER — IBUPROFEN 600 MG PO TABS
600.0000 mg | ORAL_TABLET | Freq: Four times a day (QID) | ORAL | Status: DC
  Administered 2011-09-17 – 2011-09-18 (×6): 600 mg via ORAL
  Filled 2011-09-16 (×6): qty 1

## 2011-09-16 MED ORDER — EPHEDRINE 5 MG/ML INJ: 10.0000 mg | INTRAVENOUS | Status: DC | PRN

## 2011-09-16 MED ORDER — LACTATED RINGERS IV SOLN: 500.0000 mL | Freq: Once | INTRAVENOUS | Status: DC

## 2011-09-16 MED ORDER — TETANUS-DIPHTH-ACELL PERTUSSIS 5-2.5-18.5 LF-MCG/0.5 IM SUSP
0.5000 mL | Freq: Once | INTRAMUSCULAR | Status: AC
  Administered 2011-09-18: 0.5 mL via INTRAMUSCULAR
  Filled 2011-09-16: qty 0.5

## 2011-09-16 MED ORDER — EPHEDRINE 5 MG/ML INJ
10.0000 mg | INTRAVENOUS | Status: DC | PRN
  Filled 2011-09-16: qty 4

## 2011-09-16 MED ORDER — ONDANSETRON HCL 4 MG/2ML IJ SOLN: 4.0000 mg | INTRAMUSCULAR | Status: DC | PRN

## 2011-09-16 MED ORDER — IBUPROFEN 600 MG PO TABS: 600.0000 mg | ORAL_TABLET | Freq: Four times a day (QID) | ORAL | Status: DC | PRN

## 2011-09-16 MED ORDER — LACTATED RINGERS IV SOLN
INTRAVENOUS | Status: DC
  Administered 2011-09-16 (×2): via INTRAVENOUS

## 2011-09-16 MED ORDER — DIPHENHYDRAMINE HCL 50 MG/ML IJ SOLN: 12.5000 mg | INTRAMUSCULAR | Status: DC | PRN

## 2011-09-16 MED ORDER — OXYTOCIN 40 UNITS IN LACTATED RINGERS INFUSION - SIMPLE MED: 62.5000 mL/h | Freq: Once | INTRAVENOUS | Status: DC

## 2011-09-16 MED ORDER — LANOLIN HYDROUS EX OINT: TOPICAL_OINTMENT | CUTANEOUS | Status: DC | PRN

## 2011-09-16 MED ORDER — MEASLES, MUMPS & RUBELLA VAC ~~LOC~~ INJ: 0.5000 mL | INJECTION | Freq: Once | SUBCUTANEOUS | Status: DC

## 2011-09-16 MED ORDER — SIMETHICONE 80 MG PO CHEW: 80.0000 mg | CHEWABLE_TABLET | ORAL | Status: DC | PRN

## 2011-09-16 MED ORDER — CITRIC ACID-SODIUM CITRATE 334-500 MG/5ML PO SOLN: 30.0000 mL | ORAL | Status: DC | PRN

## 2011-09-16 MED ORDER — OXYTOCIN BOLUS FROM INFUSION
250.0000 mL | Freq: Once | INTRAVENOUS | Status: DC
  Filled 2011-09-16: qty 500

## 2011-09-16 MED ORDER — OXYCODONE-ACETAMINOPHEN 5-325 MG PO TABS: 1.0000 | ORAL_TABLET | ORAL | Status: DC | PRN

## 2011-09-16 MED ORDER — FENTANYL 2.5 MCG/ML BUPIVACAINE 1/10 % EPIDURAL INFUSION (WH - ANES): 14.0000 mL/h | INTRAMUSCULAR | Status: DC

## 2011-09-16 MED ORDER — LACTATED RINGERS IV SOLN
500.0000 mL | INTRAVENOUS | Status: DC | PRN
  Administered 2011-09-16: 500 mL via INTRAVENOUS

## 2011-09-16 MED ORDER — LIDOCAINE HCL (PF) 1 % IJ SOLN
INTRAMUSCULAR | Status: DC | PRN
  Administered 2011-09-16 (×2): 4 mL

## 2011-09-16 MED ORDER — ONDANSETRON HCL 4 MG PO TABS: 4.0000 mg | ORAL_TABLET | ORAL | Status: DC | PRN

## 2011-09-16 MED ORDER — OXYCODONE-ACETAMINOPHEN 5-325 MG PO TABS
1.0000 | ORAL_TABLET | ORAL | Status: DC | PRN
  Administered 2011-09-17 – 2011-09-18 (×3): 2 via ORAL
  Filled 2011-09-16 (×3): qty 2

## 2011-09-16 MED ORDER — SENNOSIDES-DOCUSATE SODIUM 8.6-50 MG PO TABS
2.0000 | ORAL_TABLET | Freq: Every day | ORAL | Status: DC
  Administered 2011-09-16 – 2011-09-17 (×2): 2 via ORAL

## 2011-09-16 MED ORDER — ONDANSETRON HCL 4 MG/2ML IJ SOLN
4.0000 mg | Freq: Four times a day (QID) | INTRAMUSCULAR | Status: DC | PRN
  Administered 2011-09-16: 4 mg via INTRAVENOUS
  Filled 2011-09-16: qty 2

## 2011-09-16 MED ORDER — TERBUTALINE SULFATE 1 MG/ML IJ SOLN: 0.2500 mg | Freq: Once | INTRAMUSCULAR | Status: DC | PRN

## 2011-09-16 MED ORDER — OXYTOCIN 40 UNITS IN LACTATED RINGERS INFUSION - SIMPLE MED
1.0000 m[IU]/min | INTRAVENOUS | Status: DC
  Administered 2011-09-16: 1 m[IU]/min via INTRAVENOUS
  Filled 2011-09-16: qty 1000

## 2011-09-16 MED ORDER — PHENYLEPHRINE 40 MCG/ML (10ML) SYRINGE FOR IV PUSH (FOR BLOOD PRESSURE SUPPORT)
80.0000 ug | PREFILLED_SYRINGE | INTRAVENOUS | Status: DC | PRN
  Filled 2011-09-16: qty 5

## 2011-09-16 MED ORDER — WITCH HAZEL-GLYCERIN EX PADS
1.0000 | MEDICATED_PAD | CUTANEOUS | Status: DC | PRN
Start: 2011-09-16 — End: 2011-09-18

## 2011-09-16 MED ORDER — PRENATAL MULTIVITAMIN CH
1.0000 | ORAL_TABLET | Freq: Every day | ORAL | Status: DC
  Administered 2011-09-17 – 2011-09-18 (×2): 1 via ORAL
  Filled 2011-09-16 (×2): qty 1

## 2011-09-16 MED ORDER — ACETAMINOPHEN 325 MG PO TABS: 650.0000 mg | ORAL_TABLET | ORAL | Status: DC | PRN

## 2011-09-16 MED ORDER — BENZOCAINE-MENTHOL 20-0.5 % EX AERO
1.0000 | INHALATION_SPRAY | CUTANEOUS | Status: DC | PRN
Start: 2011-09-16 — End: 2011-09-18
  Administered 2011-09-17: 1 via TOPICAL
  Filled 2011-09-16: qty 56

## 2011-09-16 MED ORDER — DIBUCAINE 1 % RE OINT: 1.0000 "application " | TOPICAL_OINTMENT | RECTAL | Status: DC | PRN

## 2011-09-16 MED ORDER — ZOLPIDEM TARTRATE 5 MG PO TABS: 5.0000 mg | ORAL_TABLET | Freq: Every evening | ORAL | Status: DC | PRN

## 2011-09-16 MED ORDER — FLEET ENEMA 7-19 GM/118ML RE ENEM: 1.0000 | ENEMA | RECTAL | Status: DC | PRN

## 2011-09-16 MED ORDER — DIPHENHYDRAMINE HCL 25 MG PO CAPS: 25.0000 mg | ORAL_CAPSULE | Freq: Four times a day (QID) | ORAL | Status: DC | PRN

## 2011-09-16 MED ORDER — LIDOCAINE HCL (PF) 1 % IJ SOLN: 30.0000 mL | INTRAMUSCULAR | Status: DC | PRN

## 2011-09-16 MED ORDER — LACTATED RINGERS IV SOLN
500.0000 mL | Freq: Once | INTRAVENOUS | Status: AC
  Administered 2011-09-16: 500 mL via INTRAVENOUS

## 2011-09-16 MED ORDER — BUTORPHANOL TARTRATE 1 MG/ML IJ SOLN
2.0000 mg | INTRAMUSCULAR | Status: DC | PRN
  Administered 2011-09-16: 2 mg via INTRAVENOUS
  Filled 2011-09-16: qty 2

## 2011-09-16 MED ORDER — FENTANYL 2.5 MCG/ML BUPIVACAINE 1/10 % EPIDURAL INFUSION (WH - ANES)
INTRAMUSCULAR | Status: DC | PRN
  Administered 2011-09-16: 14 mL/h via EPIDURAL

## 2011-09-16 MED ORDER — FENTANYL 2.5 MCG/ML BUPIVACAINE 1/10 % EPIDURAL INFUSION (WH - ANES)
14.0000 mL/h | INTRAMUSCULAR | Status: DC
  Administered 2011-09-16 (×2): 14 mL/h via EPIDURAL
  Filled 2011-09-16 (×3): qty 60

## 2011-09-16 NOTE — Anesthesia Preprocedure Evaluation (Signed)
Anesthesia Evaluation  Patient identified by MRN, date of birth, ID band Patient awake    Reviewed: Allergy & Precautions, H&P , Patient's Chart, lab work & pertinent test results  Airway Mallampati: III TM Distance: >3 FB Neck ROM: full    Dental No notable dental hx. (+) Teeth Intact   Pulmonary neg pulmonary ROS,  breath sounds clear to auscultation  Pulmonary exam normal       Cardiovascular negative cardio ROS  Rhythm:regular Rate:Normal     Neuro/Psych PSYCHIATRIC DISORDERS Bipolar Disorder negative neurological ROS     GI/Hepatic negative GI ROS, Neg liver ROS,   Endo/Other  negative endocrine ROSWell Controlled, Gestational, Oral Hypoglycemic Agents  Renal/GU negative Renal ROS  negative genitourinary   Musculoskeletal negative musculoskeletal ROS (+)   Abdominal Normal abdominal exam  (+)   Peds  Hematology negative hematology ROS (+)   Anesthesia Other Findings   Reproductive/Obstetrics (+) Pregnancy                           Anesthesia Physical Anesthesia Plan  ASA: II  Anesthesia Plan: Epidural   Post-op Pain Management:    Induction:   Airway Management Planned:   Additional Equipment:   Intra-op Plan:   Post-operative Plan:   Informed Consent: I have reviewed the patients History and Physical, chart, labs and discussed the procedure including the risks, benefits and alternatives for the proposed anesthesia with the patient or authorized representative who has indicated his/her understanding and acceptance.     Plan Discussed with: Anesthesiologist  Anesthesia Plan Comments:         Anesthesia Quick Evaluation
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
## 2011-09-16 NOTE — Progress Notes (Signed)
Just got epidural, drowsy after IV meds O VSS      fhts category 1      abd soft between uc q 2-4      Contractions  Mild to mod      Vag 4 100 -1 VTX R A not in active labor P encouraged to sleep, continue care Lavera Guise, CNM

## 2011-09-16 NOTE — Progress Notes (Signed)
Comfortable, some pressure O VSS      fhts category 1 110s      abd soft between uc q 2-3      Contractions mod      Vag 7 100 0 VTX R bloody show WNL A active labor P continue care Lavera Guise, CNM

## 2011-09-16 NOTE — Consult Note (Addendum)
Neonatology Note:  Attendance at Code Apgar:   Our team responded to a Code Apgar call to room # 171 following NSVD, due to infant with shoulder dystocia. The mother is a G5P1A3 O pos, GBS neg with bipolar disorder and GDM, on glyburide. ROM occurred 9 hours prior to hours PTD and the fluid was clear.  At delivery, there was a shoulder dystocia and the baby was slow to breathe. The OB nursing staff in attendance gave vigorous stimulation and a Code Apgar was called. Our team arrived at 1 minute of life, at which time the baby was crying, but still dusky. His O2 saturation was about 82% in room air, normal for that age, and he got pinker with saturations in the 90s by 3 minutes of age.We observed him and bulb suctioned for some mucous secretions. Ap 5/9. No focal deficits seen, no palpable clavicular fracture. I spoke with the parents in the DR, then transferred the baby to the Pediatrician's care.   Doretha Sou, MD

## 2011-09-16 NOTE — Anesthesia Procedure Notes (Signed)
Epidural Patient location during procedure: OB Start time: 09/16/2011 12:42 PM  Staffing Anesthesiologist: Cayci Mcnabb A. Performed by: anesthesiologist   Preanesthetic Checklist Completed: patient identified, site marked, surgical consent, pre-op evaluation, timeout performed, IV checked, risks and benefits discussed and monitors and equipment checked  Epidural Patient position: sitting Prep: site prepped and draped and DuraPrep Patient monitoring: continuous pulse ox and blood pressure Approach: midline Injection technique: LOR air  Needle:  Needle type: Tuohy  Needle gauge: 17 G Needle length: 9 cm Needle insertion depth: 4 cm Catheter type: closed end flexible Catheter size: 19 Gauge Catheter at skin depth: 9 cm Test dose: negative and Other  Assessment Events: blood not aspirated, injection not painful, no injection resistance, negative IV test and no paresthesia  Additional Notes Patient identified. Risks and benefits discussed including failed block, incomplete  Pain control, post dural puncture headache, nerve damage, paralysis, blood pressure Changes, nausea, vomiting, reactions to medications-both toxic and allergic and post Partum back pain. All questions were answered. Patient expressed understanding and wished to proceed. Sterile technique was used throughout procedure. Epidural site was Dressed with sterile barrier dressing. No paresthesias, signs of intravascular injection Or signs of intrathecal spread were encountered.  Patient was more comfortable after the epidural was dosed. Please see RN's note for documentation of vital signs and FHR which are stable.

## 2011-09-17 ENCOUNTER — Inpatient Hospital Stay (HOSPITAL_COMMUNITY): Payer: 59 | Admitting: Anesthesiology

## 2011-09-17 ENCOUNTER — Encounter (HOSPITAL_COMMUNITY)
Admission: RE | Disposition: A | Discharge status: Home / Self Care | Payer: Self-pay | Source: Ambulatory Visit | Attending: Obstetrics and Gynecology

## 2011-09-17 ENCOUNTER — Encounter (HOSPITAL_COMMUNITY): Payer: Self-pay | Admitting: Anesthesiology

## 2011-09-17 DIAGNOSIS — Z302 Encounter for sterilization: Secondary | ICD-10-CM

## 2011-09-17 HISTORY — PX: TUBAL LIGATION: SHX77

## 2011-09-17 LAB — GLUCOSE, CAPILLARY: Glucose-Capillary: 104 mg/dL — ABNORMAL HIGH (ref 70–99)

## 2011-09-17 LAB — CBC
MCV: 78.7 fL (ref 78.0–100.0)
Platelets: 105 10*3/uL — ABNORMAL LOW (ref 150–400)
RBC: 3.52 MIL/uL — ABNORMAL LOW (ref 3.87–5.11)
WBC: 17.9 10*3/uL — ABNORMAL HIGH (ref 4.0–10.5)

## 2011-09-17 SURGERY — LIGATION, FALLOPIAN TUBE, POSTPARTUM
Anesthesia: Epidural | Site: Abdomen | Laterality: Bilateral | Wound class: Clean Contaminated

## 2011-09-17 MED ORDER — METOCLOPRAMIDE HCL 10 MG PO TABS
10.0000 mg | ORAL_TABLET | Freq: Once | ORAL | Status: AC
  Administered 2011-09-17: 10 mg via ORAL
  Filled 2011-09-17: qty 1

## 2011-09-17 MED ORDER — BUPIVACAINE HCL (PF) 0.25 % IJ SOLN
INTRAMUSCULAR | Status: DC | PRN
  Administered 2011-09-17: 10 mL

## 2011-09-17 MED ORDER — FAMOTIDINE 20 MG PO TABS: 40.0000 mg | ORAL_TABLET | Freq: Once | ORAL | Status: DC

## 2011-09-17 MED ORDER — FENTANYL CITRATE 0.05 MG/ML IJ SOLN
INTRAMUSCULAR | Status: AC
  Filled 2011-09-17: qty 2

## 2011-09-17 MED ORDER — FAMOTIDINE 20 MG PO TABS
40.0000 mg | ORAL_TABLET | Freq: Once | ORAL | Status: AC
  Administered 2011-09-17: 40 mg via ORAL
  Filled 2011-09-17 (×2): qty 1

## 2011-09-17 MED ORDER — METOCLOPRAMIDE HCL 10 MG PO TABS: 10.0000 mg | ORAL_TABLET | Freq: Once | ORAL | Status: DC

## 2011-09-17 MED ORDER — MIDAZOLAM HCL 5 MG/5ML IJ SOLN
INTRAMUSCULAR | Status: DC | PRN
  Administered 2011-09-17: 2 mg via INTRAVENOUS

## 2011-09-17 MED ORDER — FENTANYL CITRATE 0.05 MG/ML IJ SOLN
25.0000 ug | INTRAMUSCULAR | Status: DC | PRN
  Administered 2011-09-17: 25 ug via INTRAVENOUS

## 2011-09-17 MED ORDER — MIDAZOLAM HCL 2 MG/2ML IJ SOLN
INTRAMUSCULAR | Status: AC
  Filled 2011-09-17: qty 2

## 2011-09-17 MED ORDER — LIDOCAINE-EPINEPHRINE (PF) 2 %-1:200000 IJ SOLN
INTRAMUSCULAR | Status: AC
  Filled 2011-09-17: qty 20

## 2011-09-17 MED ORDER — PHENYLEPHRINE 40 MCG/ML (10ML) SYRINGE FOR IV PUSH (FOR BLOOD PRESSURE SUPPORT)
PREFILLED_SYRINGE | INTRAVENOUS | Status: AC
  Filled 2011-09-17: qty 5

## 2011-09-17 MED ORDER — BUPIVACAINE HCL (PF) 0.25 % IJ SOLN
INTRAMUSCULAR | Status: AC
  Filled 2011-09-17: qty 30

## 2011-09-17 MED ORDER — LACTATED RINGERS IV SOLN
INTRAVENOUS | Status: DC
  Administered 2011-09-17: 09:00:00 via INTRAVENOUS

## 2011-09-17 MED ORDER — SODIUM BICARBONATE 8.4 % IV SOLN
INTRAVENOUS | Status: AC
  Filled 2011-09-17: qty 50

## 2011-09-17 MED ORDER — PHENYLEPHRINE HCL 10 MG/ML IJ SOLN
INTRAMUSCULAR | Status: DC | PRN
  Administered 2011-09-17: 40 ug via INTRAVENOUS

## 2011-09-17 MED ORDER — SODIUM BICARBONATE 8.4 % IV SOLN
INTRAVENOUS | Status: DC | PRN
  Administered 2011-09-17: 5 mL via EPIDURAL

## 2011-09-17 MED ORDER — MEPERIDINE HCL 25 MG/ML IJ SOLN
INTRAMUSCULAR | Status: AC
  Filled 2011-09-17: qty 1

## 2011-09-17 MED ORDER — MEPERIDINE HCL 25 MG/ML IJ SOLN
INTRAMUSCULAR | Status: DC | PRN
  Administered 2011-09-17: 12.5 mg via INTRAVENOUS

## 2011-09-17 SURGICAL SUPPLY — 38 items
ADH SKN CLS APL DERMABOND .7 (GAUZE/BANDAGES/DRESSINGS) ×2
APL SKNCLS STERI-STRIP NONHPOA (GAUZE/BANDAGES/DRESSINGS)
BENZOIN TINCTURE PRP APPL 2/3 (GAUZE/BANDAGES/DRESSINGS) IMPLANT
BLADE SURG 15 STRL LF C SS BP (BLADE) IMPLANT
BLADE SURG 15 STRL SS (BLADE) ×2
CHLORAPREP W/TINT 26ML (MISCELLANEOUS) ×2 IMPLANT
CLOTH BEACON ORANGE TIMEOUT ST (SAFETY) ×2 IMPLANT
CONTAINER PREFILL 10% NBF 15ML (MISCELLANEOUS) ×4 IMPLANT
DECANTER SPIKE VIAL GLASS SM (MISCELLANEOUS) ×1 IMPLANT
DERMABOND ADVANCED (GAUZE/BANDAGES/DRESSINGS) ×2
DERMABOND ADVANCED .7 DNX12 (GAUZE/BANDAGES/DRESSINGS) IMPLANT
DRAPE LAPAROTOMY TRNSV 102X78 (DRAPE) ×1 IMPLANT
ELECT REM PT RETURN 9FT ADLT (ELECTROSURGICAL) ×2
ELECTRODE REM PT RTRN 9FT ADLT (ELECTROSURGICAL) ×1 IMPLANT
GAUZE SPONGE 4X4 16PLY XRAY LF (GAUZE/BANDAGES/DRESSINGS) ×1 IMPLANT
GLOVE BIO SURGEON STRL SZ7.5 (GLOVE) ×4 IMPLANT
GLOVE BIOGEL PI IND STRL 7.5 (GLOVE) ×1 IMPLANT
GLOVE BIOGEL PI INDICATOR 7.5 (GLOVE) ×1
GLOVE INDICATOR 7.0 STRL GRN (GLOVE) ×1 IMPLANT
GOWN PREVENTION PLUS LG XLONG (DISPOSABLE) ×2 IMPLANT
GOWN SURGICAL LARGE (GOWNS) ×1 IMPLANT
NDL HYPO 25X1 1.5 SAFETY (NEEDLE) IMPLANT
NEEDLE HYPO 25X1 1.5 SAFETY (NEEDLE) ×2 IMPLANT
NS IRRIG 1000ML POUR BTL (IV SOLUTION) ×2 IMPLANT
PACK ABDOMINAL MINOR (CUSTOM PROCEDURE TRAY) ×2 IMPLANT
PAD OB MATERNITY 4.3X12.25 (PERSONAL CARE ITEMS) ×1 IMPLANT
PENCIL BUTTON HOLSTER BLD 10FT (ELECTRODE) ×2 IMPLANT
PROTECTOR NERVE ULNAR (MISCELLANEOUS) ×1 IMPLANT
SLEEVE SCD COMPRESS KNEE MED (MISCELLANEOUS) ×1 IMPLANT
SPONGE LAP 4X18 X RAY DECT (DISPOSABLE) ×1 IMPLANT
STRIP CLOSURE SKIN 1/4X3 (GAUZE/BANDAGES/DRESSINGS) IMPLANT
SUT MNCRL AB 3-0 PS2 27 (SUTURE) ×2 IMPLANT
SUT PLAIN 0 NONE (SUTURE) ×2 IMPLANT
SUT VIC AB 0 CT1 36 (SUTURE) ×2 IMPLANT
SYR CONTROL 10ML LL (SYRINGE) ×1 IMPLANT
TOWEL OR 17X24 6PK STRL BLUE (TOWEL DISPOSABLE) ×4 IMPLANT
TRAY FOLEY CATH 14FR (SET/KITS/TRAYS/PACK) ×2 IMPLANT
WATER STERILE IRR 1000ML POUR (IV SOLUTION) ×2 IMPLANT

## 2011-09-17 NOTE — Progress Notes (Signed)
Patient received to bay 2 of Short Stay.  IV patent, epidural catheter taped to back.  Has not signed consent because has questions for surgeon.

## 2011-09-17 NOTE — Transfer of Care (Signed)
Immediate Anesthesia Transfer of Care Note  Patient: Jasmine Huerta  Procedure(s) Performed: Procedure(s) (LRB): POST PARTUM TUBAL LIGATION (Bilateral)  Patient Location: PACU  Anesthesia Type: Epidural  Level of Consciousness: awake, alert  and oriented  Airway & Oxygen Therapy: Patient Spontanous Breathing  Post-op Assessment: Report given to PACU RN  Post vital signs: Reviewed and stable  Complications: No apparent anesthesia complications

## 2011-09-17 NOTE — Progress Notes (Signed)
Preop Diagnosis: Desires Sterilization   Postop Diagnosis: Desires Sterilization   Procedure: POST PARTUM TUBAL LIGATION   Anesthesia: Epidural   Anesthesiologist: No responsible anesthesiologist has been recorded for the case.   Attending: Michael Litter, MD   Assistant:  Findings: normal appearing bilateral ovaries and fallopian tubes.  Pathology: portions of bilateral fallopian tubes  Fluids:  See flowsheet  UOP: See flowsheet  EBL: Minimal  Complications: None  Procedure: The patient was taken to the operating room after the risks, benefits, alternative, complications, treatment options, and expected outcomes were discussed with the patient. The patient verbalized understanding, the patient concurred with the proposed plan and consent signed and witnessed. The patient was taken to the Operating Room, identified as Jasmine Huerta and the procedure verified as bilateral tubal ligation. A Time Out was held and the above information confirmed.  The patient was given a surgical level via the epidural and prepped, draped, and catheterized in the normal, sterile fashion in the supine position.  A 10mm incision was made at the umbilicus and carried down to the underlying layer of fascia and peritoneum entered without difficulty.  The right fallopian tube was indentified and carried out to its fimbriated end and ligated with 2-0 plain suture twice.  The tube was then excised and sent to pathology.  The same was done on the contralateral side.    The fascia was then repaired with 0 vicryl via a running stitch and the incision was closed with 3-0 vicryl via subcuticular sutures.  Instrument, sponge, and needle counts were correct, patient tolerated the procedure well and was returned to the recovery room in good condition.
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
## 2011-09-17 NOTE — Progress Notes (Signed)
S: comfortable, little bleeding, slept little     breastfeeding O VSS     abd soft, nt, ff      sm  flowperineum clean intact     -Homans sign bilaterally,        No edema A normal involution     Lactating     PP day 1 P for BTL today, continue care Lavera Guise, CNM

## 2011-09-17 NOTE — Anesthesia Postprocedure Evaluation (Signed)
  Anesthesia Post-op Note  Patient: Jasmine Huerta  Procedure(s) Performed: * No procedures listed *  Patient Location:mother/baby   Anesthesia Type: Epidural  Level of Consciousness: awake, alert  and oriented  Airway and Oxygen Therapy: Patient Spontanous Breathing  Post-op Pain: none  Post-op Assessment: Post-op Vital signs reviewed  Post-op Vital Signs: Reviewed and stable  Complications: No apparent anesthesia complications

## 2011-09-17 NOTE — Anesthesia Postprocedure Evaluation (Signed)
  Anesthesia Post-op Note  Patient: Jasmine Huerta  Procedure(s) Performed: Procedure(s) (LRB): POST PARTUM TUBAL LIGATION (Bilateral)  Patient is awake, responsive, moving her legs, and has signs of resolution of her numbness. Pain and nausea are reasonably well controlled. Vital signs are stable and clinically acceptable. Oxygen saturation is clinically acceptable. There are no apparent anesthetic complications at this time. Patient is ready for discharge.

## 2011-09-17 NOTE — Anesthesia Preprocedure Evaluation (Signed)
Anesthesia Evaluation  Patient identified by MRN, date of birth, ID band Patient awake    Reviewed: Allergy & Precautions, H&P , Patient's Chart, lab work & pertinent test results  Airway Mallampati: III TM Distance: >3 FB Neck ROM: full    Dental No notable dental hx. (+) Teeth Intact   Pulmonary neg pulmonary ROS,  breath sounds clear to auscultation  Pulmonary exam normal       Cardiovascular negative cardio ROS  Rhythm:regular Rate:Normal     Neuro/Psych PSYCHIATRIC DISORDERS Bipolar Disorder negative neurological ROS     GI/Hepatic negative GI ROS, Neg liver ROS,   Endo/Other  Well Controlled, Gestational, Oral Hypoglycemic Agents  Renal/GU negative Renal ROS  negative genitourinary   Musculoskeletal negative musculoskeletal ROS (+)   Abdominal Normal abdominal exam  (+)   Peds  Hematology negative hematology ROS (+)   Anesthesia Other Findings   Reproductive/Obstetrics negative OB ROS                           Anesthesia Physical  Anesthesia Plan  ASA: II  Anesthesia Plan: Epidural   Post-op Pain Management:    Induction:   Airway Management Planned:   Additional Equipment:   Intra-op Plan:   Post-operative Plan:   Informed Consent: I have reviewed the patients History and Physical, chart, labs and discussed the procedure including the risks, benefits and alternatives for the proposed anesthesia with the patient or authorized representative who has indicated his/her understanding and acceptance.     Plan Discussed with: Anesthesiologist  Anesthesia Plan Comments:         Anesthesia Quick Evaluation

## 2011-09-18 ENCOUNTER — Encounter (HOSPITAL_COMMUNITY): Payer: Self-pay | Admitting: Obstetrics and Gynecology

## 2011-09-18 ENCOUNTER — Encounter (HOSPITAL_COMMUNITY)
Admission: RE | Admit: 2011-09-18 | Discharge: 2011-09-18 | Disposition: A | Discharge status: Home / Self Care | Payer: 59 | Source: Ambulatory Visit | Attending: Obstetrics and Gynecology | Admitting: Obstetrics and Gynecology

## 2011-09-18 DIAGNOSIS — O923 Agalactia: Secondary | ICD-10-CM | POA: Insufficient documentation

## 2011-09-18 LAB — CBC WITH DIFFERENTIAL/PLATELET
Basophils Absolute: 0 10*3/uL (ref 0.0–0.1)
Eosinophils Absolute: 0.1 10*3/uL (ref 0.0–0.7)
Lymphocytes Relative: 28 % (ref 12–46)
Lymphs Abs: 3.2 10*3/uL (ref 0.7–4.0)
MCH: 25.4 pg — ABNORMAL LOW (ref 26.0–34.0)
Neutrophils Relative %: 66 % (ref 43–77)
Platelets: 102 10*3/uL — ABNORMAL LOW (ref 150–400)
RBC: 3.43 MIL/uL — ABNORMAL LOW (ref 3.87–5.11)
RDW: 16.7 % — ABNORMAL HIGH (ref 11.5–15.5)
WBC: 11.4 10*3/uL — ABNORMAL HIGH (ref 4.0–10.5)

## 2011-09-18 LAB — GLUCOSE, CAPILLARY: Glucose-Capillary: 123 mg/dL — ABNORMAL HIGH (ref 70–99)

## 2011-09-18 MED ORDER — IBUPROFEN 600 MG PO TABS: 600.0000 mg | ORAL_TABLET | Freq: Four times a day (QID) | ORAL | Status: AC | PRN

## 2011-09-18 MED ORDER — OXYCODONE-ACETAMINOPHEN 5-325 MG PO TABS: 1.0000 | ORAL_TABLET | ORAL | Status: AC | PRN

## 2011-09-18 NOTE — Addendum Note (Signed)
Addendum  created 09/18/11 1115 by Wilder Glade, CRNA   Modules edited:Notes Section

## 2011-09-18 NOTE — Discharge Summary (Signed)
Physician Discharge Summary  Patient ID: Jasmine Huerta MRN: [id number] DOB/AGE: 1985-06-17 26 y.o.  Admit date: 09/16/2011 Discharge date: 09/18/2011  Admission Diagnoses: [redacted]w[redacted]d GDM on glyburide  induction   Discharge Diagnoses:  Active Problems:  Gestational diabetes  NSVD (normal spontaneous vaginal delivery) anemia BTL  Discharged Condition: stable  Hospital Course: [redacted]w[redacted]d GDM on glyburide  Induction, ARM, Pitocin, epidural, SVD, labia lac, normal involution, BTLFBS WNL  Consults: None  Significant Diagnostic Studies: labs:  Hemoglobin & Hematocrit     Component Value Date/Time   HGB 8.7* 09/18/2011 0500   HCT 27.1* 09/18/2011 0500     Treatments: IV hydration  Discharge Exam: Blood pressure 116/74, pulse 53, temperature 98 F (36.7 C), temperature source Oral, resp. rate 18, height 5\' 3"  (1.6 m), weight 156 lb (70.761 kg), SpO2 98.00%, unknown if currently breastfeeding. General appearance: alert, cooperative and no distress Subjective: Postpartum Day 2 Patient reports tolerating PO, + flatus and no problems voiding.    Objective:   Vital signs in last 24 hours: Temp:  [97.6 F (36.4 C)-98.7 F (37.1 C)] 98 F (36.7 C) (08/16 0500) Pulse Rate:  [51-99] 53  (08/16 0500) Resp:  [16-20] 18  (08/16 0500) BP: (106-123)/(63-80) 116/74 mmHg (08/16 0500) SpO2:  [95 %-99 %] 98 % (08/15 1600)  Physical Exam:  General: alert, cooperative and no distress Lochia: appropriate Uterine Fundus: firm Incision: a U without redness, edema, or drainage DVT Evaluation: No evidence of DVT seen on physical exam. No significant calf/ankle edema. Lungs clear bilaterally AP RRR Bowel sounds active abd nontympanic  Disposition: home with newborn  Discharge Orders    Future Orders Please Complete By Expires   OB RESULT CONSOLE Group B Strep      Comments:   This external order was created through the Results Console.     Medication List  As of 09/18/2011  7:36 AM   STOP taking  these medications         glucose blood test strip      glyBURIDE 2.5 MG tablet         TAKE these medications         ibuprofen 600 MG tablet   Commonly known as: ADVIL,MOTRIN   Take 1 tablet (600 mg total) by mouth every 6 (six) hours as needed for pain.      oxyCODONE-acetaminophen 5-325 MG per tablet   Commonly known as: PERCOCET/ROXICET   Take 1-2 tablets by mouth every 4 (four) hours as needed (moderate - severe pain).      prenatal multivitamin Tabs   Take 1 tablet by mouth daily.           Follow-up Information    Follow up with [facility] in 6 weeks.       plan OTC iron BID, stool softner OTC BID Signed: Tameika Heckmann 09/18/2011, 7:36 AM

## 2011-09-18 NOTE — Anesthesia Postprocedure Evaluation (Signed)
  Anesthesia Post-op Note  Patient: Jasmine Huerta  Procedure(s) Performed: Procedure(s) (LRB): POST PARTUM TUBAL LIGATION (Bilateral)  Patient Location: PACU and Mother/Baby  Anesthesia Type: Epidural  Level of Consciousness: awake, alert  and oriented  Airway and Oxygen Therapy: Patient Spontanous Breathing  Post-op Pain: mild  Post-op Assessment: Post-op Vital signs reviewed, Patient's Cardiovascular Status Stable, Respiratory Function Stable, Patent Airway, No signs of Nausea or vomiting, No headache, No backache, No residual numbness and No residual motor weakness  Post-op Vital Signs: Reviewed and stable  Complications: No apparent anesthesia complications

## 2011-09-18 NOTE — Progress Notes (Signed)
SW Lulu Riding verbalized her clearance of patient for discharge today.

## 2011-09-18 NOTE — Addendum Note (Signed)
Addendum  created 09/18/11 1356 by Sarely Stracener S Galit Urich, CRNA   Modules edited:Charges VN    

## 2011-09-18 NOTE — Addendum Note (Signed)
Addendum  created 09/18/11 1356 by Elbert Ewings, CRNA   Modules edited:Charges VN
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
## 2011-09-24 NOTE — Op Note (Signed)
Preop Diagnosis: Desires Sterilization   Postop Diagnosis: Desires Sterilization   Procedure: POST PARTUM TUBAL LIGATION   Anesthesia: Epidural   Anesthesiologist: No responsible anesthesiologist has been recorded for the case.   Attending: Naima A Dillard, MD   Assistant:  Findings: normal appearing bilateral ovaries and fallopian tubes.  Pathology: portions of bilateral fallopian tubes  Fluids:  See flowsheet  UOP: See flowsheet  EBL: Minimal  Complications: None  Procedure: The patient was taken to the operating room after the risks, benefits, alternative, complications, treatment options, and expected outcomes were discussed with the patient. The patient verbalized understanding, the patient concurred with the proposed plan and consent signed and witnessed. The patient was taken to the Operating Room, identified as Jasmine Huerta and the procedure verified as bilateral tubal ligation. A Time Out was held and the above information confirmed.  The patient was given a surgical level via the epidural and prepped, draped, and catheterized in the normal, sterile fashion in the supine position.  A 10mm incision was made at the umbilicus and carried down to the underlying layer of fascia and peritoneum entered without difficulty.  The right fallopian tube was indentified and carried out to its fimbriated end and ligated with 2-0 plain suture twice.  The tube was then excised and sent to pathology.  The same was done on the contralateral side.    The fascia was then repaired with 0 vicryl via a running stitch and the incision was closed with 3-0 vicryl via subcuticular sutures.  Instrument, sponge, and needle counts were correct, patient tolerated the procedure well and was returned to the recovery room in good condition.   

## 2011-10-27 ENCOUNTER — Encounter: Payer: Self-pay | Admitting: Obstetrics and Gynecology

## 2011-10-27 ENCOUNTER — Ambulatory Visit (INDEPENDENT_AMBULATORY_CARE_PROVIDER_SITE_OTHER): Payer: 59 | Admitting: Obstetrics and Gynecology

## 2011-10-27 NOTE — Progress Notes (Signed)
Date of delivery: 09/16/11 Female Name: Jasmine Huerta  Vaginal delivery:yes Cesarean section:no Tubal ligation:yes GDM:no Breast Feeding:no Bottle Feeding:yes Post-Partum Blues:no Abnormal pap:no 05/2010 WNL Normal GU function: yes Normal GI function:yes Returning to work:yes unsure when per pt  EPDS: 4

## 2011-11-04 NOTE — Progress Notes (Signed)
Subjective:     Jasmine Huerta is a 26 y.o. female who presents for a postpartum visit.  I have fully reviewed the prenatal and intrapartum course.    Patient is sexually active.   The following portions of the patient's history were reviewed and updated as appropriate: allergies, current medications, past family history, past medical history, past social history, past surgical history and problem list.  Review of Systems Pertinent items are noted in HPI.   Objective:    BP 100/68  Resp 18  Wt 132 lb (59.875 kg)  LMP 10/23/2011  Breastfeeding? No  General:  alert, cooperative and no distress     Lungs: clear to auscultation bilaterally  Heart:  regular rate and rhythm, S1, S2 normal, no murmur  Abdomen: soft, non-tender; bowel sounds normal; no masses,  no organomegaly   Vulva:  normal  Vagina: normal vagina  Cervix:  normal  Uterus : normal size, contour, position, consistency, mobility, non-tender  Adnexa:  normal adnexa             Assessment:     Normal postpartum exam.  Pap smear not done at today's visit.   Plan:  Pap due in April 2014  rv'd healthy diet and exercise Pt had BTL   Ellean Firman M CNM  11/04/2011 8:12 AM

## 2013-12-04 ENCOUNTER — Encounter: Payer: Self-pay | Admitting: Obstetrics and Gynecology

## 2014-10-15 ENCOUNTER — Ambulatory Visit: Payer: Self-pay | Admitting: Neurology

## 2014-11-23 ENCOUNTER — Ambulatory Visit: Payer: Self-pay | Admitting: Neurology

## 2015-12-13 ENCOUNTER — Ambulatory Visit: Payer: 59 | Admitting: Neurology

## 2016-01-06 ENCOUNTER — Ambulatory Visit: Payer: 59 | Admitting: Neurology

## 2016-04-01 ENCOUNTER — Encounter (HOSPITAL_COMMUNITY): Payer: Self-pay

## 2016-04-01 ENCOUNTER — Emergency Department (HOSPITAL_COMMUNITY)
Admission: EM | Admit: 2016-04-01 | Discharge: 2016-04-01 | Disposition: A | Discharge status: Home / Self Care | Payer: 59 | Attending: Emergency Medicine | Admitting: Emergency Medicine

## 2016-04-01 DIAGNOSIS — Z79899 Other long term (current) drug therapy: Secondary | ICD-10-CM | POA: Insufficient documentation

## 2016-04-01 DIAGNOSIS — M545 Low back pain: Secondary | ICD-10-CM | POA: Diagnosis not present

## 2016-04-01 DIAGNOSIS — Y999 Unspecified external cause status: Secondary | ICD-10-CM | POA: Insufficient documentation

## 2016-04-01 DIAGNOSIS — Y92410 Unspecified street and highway as the place of occurrence of the external cause: Secondary | ICD-10-CM | POA: Diagnosis not present

## 2016-04-01 DIAGNOSIS — R51 Headache: Secondary | ICD-10-CM | POA: Diagnosis not present

## 2016-04-01 DIAGNOSIS — M542 Cervicalgia: Secondary | ICD-10-CM | POA: Diagnosis not present

## 2016-04-01 DIAGNOSIS — Y939 Activity, unspecified: Secondary | ICD-10-CM | POA: Diagnosis not present

## 2016-04-01 MED ORDER — ONDANSETRON 8 MG PO TBDP
8.0000 mg | ORAL_TABLET | Freq: Once | ORAL | Status: AC
  Administered 2016-04-01: 8 mg via ORAL
  Filled 2016-04-01: qty 1

## 2016-04-01 MED ORDER — METHOCARBAMOL 500 MG PO TABS: 500.0000 mg | ORAL_TABLET | Freq: Every evening | ORAL | 0 refills | Status: DC | PRN

## 2016-04-01 MED ORDER — IBUPROFEN 600 MG PO TABS: 600.0000 mg | ORAL_TABLET | Freq: Four times a day (QID) | ORAL | 0 refills | Status: DC | PRN

## 2016-04-01 MED ORDER — METHOCARBAMOL 500 MG PO TABS
1000.0000 mg | ORAL_TABLET | Freq: Once | ORAL | Status: AC
  Administered 2016-04-01: 1000 mg via ORAL
  Filled 2016-04-01: qty 2

## 2016-04-01 MED ORDER — IBUPROFEN 200 MG PO TABS
600.0000 mg | ORAL_TABLET | Freq: Once | ORAL | Status: AC
  Administered 2016-04-01: 600 mg via ORAL
  Filled 2016-04-01: qty 3
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
## 2016-04-01 NOTE — Discharge Instructions (Signed)
Take Ibuprofen three times daily for the next week. Take this medicine with food. °Take muscle relaxer at bedtime to help you sleep. This medicine makes you drowsy so do not take before driving or work °Use a heating pad for sore muscles - use for 20 minutes several times a day °Return for worsening symptoms ° °

## 2016-04-03 DIAGNOSIS — S43401D Unspecified sprain of right shoulder joint, subsequent encounter: Secondary | ICD-10-CM | POA: Diagnosis not present

## 2016-04-03 DIAGNOSIS — S139XXD Sprain of joints and ligaments of unspecified parts of neck, subsequent encounter: Secondary | ICD-10-CM | POA: Diagnosis not present

## 2016-04-06 DIAGNOSIS — M62838 Other muscle spasm: Secondary | ICD-10-CM | POA: Diagnosis not present

## 2016-04-28 DIAGNOSIS — Z0000 Encounter for general adult medical examination without abnormal findings: Secondary | ICD-10-CM | POA: Diagnosis not present

## 2016-04-28 DIAGNOSIS — E782 Mixed hyperlipidemia: Secondary | ICD-10-CM | POA: Diagnosis not present

## 2016-04-28 DIAGNOSIS — R7303 Prediabetes: Secondary | ICD-10-CM | POA: Diagnosis not present

## 2016-05-14 DIAGNOSIS — M25511 Pain in right shoulder: Secondary | ICD-10-CM | POA: Diagnosis not present

## 2016-05-14 DIAGNOSIS — G43909 Migraine, unspecified, not intractable, without status migrainosus: Secondary | ICD-10-CM | POA: Diagnosis not present

## 2016-05-14 DIAGNOSIS — M542 Cervicalgia: Secondary | ICD-10-CM | POA: Diagnosis not present

## 2016-05-21 DIAGNOSIS — G43909 Migraine, unspecified, not intractable, without status migrainosus: Secondary | ICD-10-CM | POA: Diagnosis not present

## 2016-05-21 DIAGNOSIS — M25511 Pain in right shoulder: Secondary | ICD-10-CM | POA: Diagnosis not present

## 2016-05-21 DIAGNOSIS — M542 Cervicalgia: Secondary | ICD-10-CM | POA: Diagnosis not present

## 2016-05-26 DIAGNOSIS — M25511 Pain in right shoulder: Secondary | ICD-10-CM | POA: Diagnosis not present

## 2016-05-26 DIAGNOSIS — G43909 Migraine, unspecified, not intractable, without status migrainosus: Secondary | ICD-10-CM | POA: Diagnosis not present

## 2016-05-26 DIAGNOSIS — M542 Cervicalgia: Secondary | ICD-10-CM | POA: Diagnosis not present

## 2016-06-02 DIAGNOSIS — G43909 Migraine, unspecified, not intractable, without status migrainosus: Secondary | ICD-10-CM | POA: Diagnosis not present

## 2016-06-02 DIAGNOSIS — M542 Cervicalgia: Secondary | ICD-10-CM | POA: Diagnosis not present

## 2016-06-02 DIAGNOSIS — M25511 Pain in right shoulder: Secondary | ICD-10-CM | POA: Diagnosis not present

## 2016-06-09 DIAGNOSIS — G43909 Migraine, unspecified, not intractable, without status migrainosus: Secondary | ICD-10-CM | POA: Diagnosis not present

## 2016-06-09 DIAGNOSIS — M542 Cervicalgia: Secondary | ICD-10-CM | POA: Diagnosis not present

## 2016-06-09 DIAGNOSIS — M25511 Pain in right shoulder: Secondary | ICD-10-CM | POA: Diagnosis not present

## 2016-06-15 DIAGNOSIS — G43909 Migraine, unspecified, not intractable, without status migrainosus: Secondary | ICD-10-CM | POA: Diagnosis not present

## 2016-06-15 DIAGNOSIS — M542 Cervicalgia: Secondary | ICD-10-CM | POA: Diagnosis not present

## 2016-06-15 DIAGNOSIS — M25511 Pain in right shoulder: Secondary | ICD-10-CM | POA: Diagnosis not present

## 2016-07-02 DIAGNOSIS — Z01419 Encounter for gynecological examination (general) (routine) without abnormal findings: Secondary | ICD-10-CM | POA: Diagnosis not present

## 2016-07-02 DIAGNOSIS — Z124 Encounter for screening for malignant neoplasm of cervix: Secondary | ICD-10-CM | POA: Diagnosis not present

## 2016-07-08 DIAGNOSIS — M542 Cervicalgia: Secondary | ICD-10-CM | POA: Diagnosis not present

## 2016-07-08 DIAGNOSIS — M25511 Pain in right shoulder: Secondary | ICD-10-CM | POA: Diagnosis not present

## 2016-07-08 DIAGNOSIS — G43909 Migraine, unspecified, not intractable, without status migrainosus: Secondary | ICD-10-CM | POA: Diagnosis not present

## 2016-07-21 DIAGNOSIS — M542 Cervicalgia: Secondary | ICD-10-CM | POA: Diagnosis not present

## 2016-07-21 DIAGNOSIS — G43909 Migraine, unspecified, not intractable, without status migrainosus: Secondary | ICD-10-CM | POA: Diagnosis not present

## 2016-07-21 DIAGNOSIS — M25511 Pain in right shoulder: Secondary | ICD-10-CM | POA: Diagnosis not present

## 2016-08-03 DIAGNOSIS — G43909 Migraine, unspecified, not intractable, without status migrainosus: Secondary | ICD-10-CM | POA: Diagnosis not present

## 2016-08-03 DIAGNOSIS — M25511 Pain in right shoulder: Secondary | ICD-10-CM | POA: Diagnosis not present

## 2016-08-03 DIAGNOSIS — M542 Cervicalgia: Secondary | ICD-10-CM | POA: Diagnosis not present

## 2016-08-21 DIAGNOSIS — M75101 Unspecified rotator cuff tear or rupture of right shoulder, not specified as traumatic: Secondary | ICD-10-CM | POA: Diagnosis not present

## 2016-08-21 DIAGNOSIS — M25511 Pain in right shoulder: Secondary | ICD-10-CM | POA: Diagnosis not present

## 2016-08-21 DIAGNOSIS — M25531 Pain in right wrist: Secondary | ICD-10-CM | POA: Diagnosis not present

## 2016-08-21 DIAGNOSIS — G5601 Carpal tunnel syndrome, right upper limb: Secondary | ICD-10-CM | POA: Diagnosis not present

## 2016-08-21 DIAGNOSIS — M5412 Radiculopathy, cervical region: Secondary | ICD-10-CM | POA: Diagnosis not present

## 2016-08-21 DIAGNOSIS — M545 Low back pain: Secondary | ICD-10-CM | POA: Diagnosis not present

## 2017-04-21 ENCOUNTER — Other Ambulatory Visit: Payer: Self-pay | Admitting: Family Medicine

## 2017-04-21 DIAGNOSIS — R1011 Right upper quadrant pain: Secondary | ICD-10-CM

## 2017-05-05 ENCOUNTER — Other Ambulatory Visit: Payer: 59

## 2017-05-21 ENCOUNTER — Encounter (HOSPITAL_COMMUNITY): Payer: Self-pay

## 2017-05-21 ENCOUNTER — Other Ambulatory Visit: Payer: Self-pay

## 2017-05-21 ENCOUNTER — Emergency Department (HOSPITAL_COMMUNITY)
Admission: EM | Admit: 2017-05-21 | Discharge: 2017-05-22 | Disposition: A | Discharge status: Home / Self Care | Payer: 59 | Attending: Emergency Medicine | Admitting: Emergency Medicine

## 2017-05-21 DIAGNOSIS — K8020 Calculus of gallbladder without cholecystitis without obstruction: Secondary | ICD-10-CM | POA: Diagnosis not present

## 2017-05-21 DIAGNOSIS — N83202 Unspecified ovarian cyst, left side: Secondary | ICD-10-CM | POA: Insufficient documentation

## 2017-05-21 DIAGNOSIS — R102 Pelvic and perineal pain: Secondary | ICD-10-CM

## 2017-05-21 DIAGNOSIS — N390 Urinary tract infection, site not specified: Secondary | ICD-10-CM | POA: Insufficient documentation

## 2017-05-21 DIAGNOSIS — R1013 Epigastric pain: Secondary | ICD-10-CM | POA: Diagnosis present

## 2017-05-21 DIAGNOSIS — R8281 Pyuria: Secondary | ICD-10-CM

## 2017-05-21 DIAGNOSIS — Z79899 Other long term (current) drug therapy: Secondary | ICD-10-CM | POA: Diagnosis not present

## 2017-05-21 NOTE — ED Triage Notes (Signed)
Pt reports sharp LUQ pain. She states that it has been intermittent for over a year, but this last week it has become more intense with the pain intensity usually peaking around 4am and waking her up. She endorses a history of IBS and takes bentyl and laxatives at home. Denies urinary symptoms. Last bowel movement about 2 days ago and she states that she is not regular.  A&Ox4.

## 2017-05-21 NOTE — ED Notes (Signed)
Bed: WA04 Expected date:  Expected time:  Means of arrival:  Comments: 

## 2017-05-22 ENCOUNTER — Emergency Department (HOSPITAL_COMMUNITY): Payer: 59

## 2017-05-22 LAB — URINALYSIS, ROUTINE W REFLEX MICROSCOPIC
BILIRUBIN URINE: NEGATIVE
Glucose, UA: NEGATIVE mg/dL
KETONES UR: NEGATIVE mg/dL
Nitrite: NEGATIVE
PROTEIN: NEGATIVE mg/dL
Specific Gravity, Urine: 1.017 (ref 1.005–1.030)
pH: 7 (ref 5.0–8.0)

## 2017-05-22 LAB — COMPREHENSIVE METABOLIC PANEL
ALBUMIN: 4.4 g/dL (ref 3.5–5.0)
ALK PHOS: 80 U/L (ref 38–126)
ALT: 31 U/L (ref 14–54)
ANION GAP: 10 (ref 5–15)
AST: 20 U/L (ref 15–41)
BUN: 14 mg/dL (ref 6–20)
CALCIUM: 9.1 mg/dL (ref 8.9–10.3)
CHLORIDE: 105 mmol/L (ref 101–111)
CO2: 23 mmol/L (ref 22–32)
CREATININE: 0.97 mg/dL (ref 0.44–1.00)
GFR calc non Af Amer: >60 mL/min (ref >60)
GLUCOSE: 198 mg/dL — AB (ref 65–99)
Potassium: 3.8 mmol/L (ref 3.5–5.1)
Sodium: 138 mmol/L (ref 135–145)
Total Bilirubin: 0.4 mg/dL (ref 0.3–1.2)
Total Protein: 8.3 g/dL — ABNORMAL HIGH (ref 6.5–8.1)

## 2017-05-22 LAB — CBC
HCT: 40.7 % (ref 36.0–46.0)
HEMOGLOBIN: 13.3 g/dL (ref 12.0–15.0)
MCH: 26.7 pg (ref 26.0–34.0)
MCHC: 32.7 g/dL (ref 30.0–36.0)
MCV: 81.7 fL (ref 78.0–100.0)
Platelets: 230 10*3/uL (ref 150–400)
RBC: 4.98 MIL/uL (ref 3.87–5.11)
RDW: 14 % (ref 11.5–15.5)
WBC: 11.1 10*3/uL — ABNORMAL HIGH (ref 4.0–10.5)

## 2017-05-22 LAB — LIPASE, BLOOD: LIPASE: 46 U/L (ref 11–51)

## 2017-05-22 LAB — I-STAT BETA HCG BLOOD, ED (MC, WL, AP ONLY)

## 2017-05-22 MED ORDER — HYDROCODONE-ACETAMINOPHEN 5-325 MG PO TABS
1.0000 | ORAL_TABLET | Freq: Once | ORAL | Status: AC
  Administered 2017-05-22: 1 via ORAL
  Filled 2017-05-22: qty 1

## 2017-05-22 MED ORDER — SUCRALFATE 1 G PO TABS
1.0000 g | ORAL_TABLET | Freq: Once | ORAL | Status: AC
  Administered 2017-05-22: 1 g via ORAL
  Filled 2017-05-22: qty 1

## 2017-05-22 MED ORDER — NITROFURANTOIN MONOHYD MACRO 100 MG PO CAPS: 100.0000 mg | ORAL_CAPSULE | Freq: Two times a day (BID) | ORAL | 0 refills | Status: DC

## 2017-05-22 MED ORDER — MORPHINE SULFATE (PF) 4 MG/ML IV SOLN
4.0000 mg | Freq: Once | INTRAVENOUS | Status: AC
  Administered 2017-05-22: 4 mg via INTRAVENOUS
  Filled 2017-05-22: qty 1

## 2017-05-22 MED ORDER — ACETAMINOPHEN ER 650 MG PO TBCR: 650.0000 mg | EXTENDED_RELEASE_TABLET | Freq: Three times a day (TID) | ORAL | 0 refills | Status: DC | PRN

## 2017-05-22 NOTE — ED Provider Notes (Signed)
[facility] Provider Note   CSN: [id number] Arrival date & time: 05/21/17  2317     History   Chief Complaint Chief Complaint  Patient presents with  . Abdominal Pain    HPI Jasmine Huerta is a 32 y.o. female.  HPI 32 year old female with history of borderline diabetes comes in with chief complaint of abdominal pain.  Patient has been having abdominal pain for the last 1 month.  Pain is located in the epigastric and periumbilical region.  Frequently patient will have pain over the left side as well.  Over the last 2 days patient has had vaginal bleeding without any dysuria, vaginal discharge associated with it.  Patient's chronic abdominal pain typically starts early in the morning, however today she started having pain late in the evening and has been constant and severe.  Pain is described as sharp and throbbing and is located in the left upper quadrant primarily, but she also admits to generalized epigastric pain.  Patient has no pain with urination, blood in the urine. Pt also denies any vaginal discharge, but she has had bleeding the last 2 days.  Patient is not pregnant.  She denies any history of pelvic disorders.  Patient does not have risk for STDs.   Past Medical History:  Diagnosis Date  . Bipolar disorder (HCC) age 19  . GDM (gestational diabetes mellitus)    history of GDM    Patient Active Problem List   Diagnosis Date Noted  . NSVD (normal spontaneous vaginal delivery) 09/16/2011  . Noncompliance with medications 08/25/2011  . Noncompliance of patient with dietary regimen 08/25/2011  . GDM (gestational diabetes mellitus) 08/25/2011  . Gestational diabetes 07/16/2011  . H/O gestational diabetes in prior pregnancy, currently pregnant 06/10/2011    Past Surgical History:  Procedure Laterality Date  . TUBAL LIGATION  09/17/2011   Procedure: POST PARTUM TUBAL LIGATION;  Surgeon: Purcell Nails, MD;  Location: [facility];  Service:  Gynecology;  Laterality: Bilateral;  . WISDOM TOOTH EXTRACTION       OB History    Gravida  5   Para  2   Term  2   Preterm  0   AB  3   Living  2     SAB      TAB  0   Ectopic  0   Multiple  0   Live Births  2            Home Medications    Prior to Admission medications   Medication Sig Start Date End Date Taking? Authorizing Provider  acetaminophen (TYLENOL 8 HOUR) 650 MG CR tablet Take 1 tablet (650 mg total) by mouth every 8 (eight) hours as needed. 05/22/17   Derwood Kaplan, MD  ibuprofen (ADVIL,MOTRIN) 600 MG tablet Take 1 tablet (600 mg total) by mouth every 6 (six) hours as needed. 04/01/16   Bethel Born, PA-C  methocarbamol (ROBAXIN) 500 MG tablet Take 1 tablet (500 mg total) by mouth at bedtime and may repeat dose one time if needed. 04/01/16   Bethel Born, PA-C  nitrofurantoin, macrocrystal-monohydrate, (MACROBID) 100 MG capsule Take 1 capsule (100 mg total) by mouth 2 (two) times daily. 05/22/17   Derwood Kaplan, MD  Prenatal Vit-Fe Fumarate-FA (PRENATAL MULTIVITAMIN) TABS Take 1 tablet by mouth daily.    [provider]    Family History Family History  Problem Relation Age of Onset  . Heart disease Sister  whole in heart at birth  . Anemia Mother   . Hypertension Mother   . Diabetes Mother   . Thyroid disease Mother   . Hypertension Maternal Grandfather   . Anemia Daughter   . Anemia Sister   . Asthma Sister   . Diabetes Maternal Uncle   . Diabetes Maternal Grandmother   . Thyroid disease Cousin   . Seizures Sister     Social History Social History   Tobacco Use  . Smoking status: Never Smoker  . Smokeless tobacco: Never Used  Substance Use Topics  . Alcohol use: No  . Drug use: No     Allergies   Patient has no known allergies.   Review of Systems Review of Systems  All other systems reviewed and are negative.    Physical Exam Updated Vital Signs BP 123/78 (BP Location: Left Arm)    Pulse 97   Temp 97.8 F (36.6 C) (Oral)   Resp 16   SpO2 99%   Physical Exam  Constitutional: She is oriented to person, place, and time. She appears well-developed.  HENT:  Head: Normocephalic and atraumatic.  Eyes: EOM are normal.  Neck: Normal range of motion. Neck supple.  Cardiovascular: Normal rate.  Pulmonary/Chest: Effort normal.  Abdominal: Bowel sounds are normal. There is generalized tenderness and tenderness in the epigastric area, periumbilical area and left upper quadrant. There is no rebound and no guarding.  Neurological: She is alert and oriented to person, place, and time.  Skin: Skin is warm and dry.  Nursing note and vitals reviewed.    ED Treatments / Results  Labs (all labs ordered are listed, but only abnormal results are displayed) Labs Reviewed  COMPREHENSIVE METABOLIC PANEL - Abnormal; Notable for the following components:      Result Value   Glucose, Bld 198 (*)    Total Protein 8.3 (*)    All other components within normal limits  CBC - Abnormal; Notable for the following components:   WBC 11.1 (*)    All other components within normal limits  URINALYSIS, ROUTINE W REFLEX MICROSCOPIC - Abnormal; Notable for the following components:   APPearance CLOUDY (*)    Hgb urine dipstick LARGE (*)    Leukocytes, UA LARGE (*)    Bacteria, UA RARE (*)    Squamous Epithelial / LPF 6-30 (*)    All other components within normal limits  LIPASE, BLOOD  I-STAT BETA HCG BLOOD, ED ([facility], [facility], [facility] ONLY)    EKG None  Radiology US Abdomen Complete  Result Date: 05/22/2017 CLINICAL DATA:  Initial evaluation for acute left upper quadrant abdominal pain. EXAM: ABDOMEN ULTRASOUND COMPLETE COMPARISON:  None. FINDINGS: Gallbladder: Echogenic stones present within the gallbladder lumen, largest of which measures 1.6 cm at the gallbladder neck. Gallbladder wall measure within normal limits at 2.7 mm. No free pericholecystic fluid. No sonographic Murphy sign elicited on  exam. Common bile duct: Diameter: 4.5 mm Liver: No focal lesion identified. Within normal limits in parenchymal echogenicity. Portal vein is patent on color Doppler imaging with normal direction of blood flow towards the liver. IVC: No abnormality visualized. Pancreas: Visualized portion unremarkable. Spleen: Size and appearance within normal limits. Right Kidney: Length: 9.6 cm. Echogenicity within normal limits. No mass or hydronephrosis visualized. Left Kidney: Length: 11.2 cm. Echogenicity within normal limits. No mass or hydronephrosis visualized. Abdominal aorta: No aneurysm visualized. Other findings: None. IMPRESSION: 1. Cholelithiasis. No sonographic features to suggest acute cholecystitis. No biliary dilatation. 2. Otherwise unremarkable abdominal  ultrasound. Electronically Signed   By: Rise Mu M.D.   On: 05/22/2017 01:46   US Pelvic Complete W Transvaginal And Torsion R/o  Result Date: 05/22/2017 CLINICAL DATA:  Initial evaluation for acute pelvic pain. EXAM: TRANSABDOMINAL AND TRANSVAGINAL ULTRASOUND OF PELVIS DOPPLER ULTRASOUND OF OVARIES TECHNIQUE: Both transabdominal and transvaginal ultrasound examinations of the pelvis were performed. Transabdominal technique was performed for global imaging of the pelvis including uterus, ovaries, adnexal regions, and pelvic cul-de-sac. It was necessary to proceed with endovaginal exam following the transabdominal exam to visualize the endometrium, uterus, and ovaries. Color and duplex Doppler ultrasound was utilized to evaluate blood flow to the ovaries. COMPARISON:  None. FINDINGS: Uterus Measurements: 8.3 x 4.1 x 4.4 cm. No fibroids or other mass visualized. Endometrium Thickness: 8.7 mm.  No focal abnormality visualized. Right ovary Measurements: 3.0 x 1.5 x 1.7 cm. Normal appearance/no adnexal mass. Left ovary Measurements: 3.7 x 2.3 x 3.0 cm. 1.0 x 1.1 x 1.0 cm corpus luteal cyst noted. An adjacent 1 cm dominant follicle/physiologic cyst  noted as well. Pulsed Doppler evaluation of both ovaries demonstrates normal low-resistance arterial and venous waveforms. Other findings Trace free physiologic fluid present within the pelvis. IMPRESSION: 1. Approximate 1 cm left ovarian corpus luteal cyst with small volume free physiologic fluid within the pelvis. 2. No other acute abnormality identified. No evidence for ovarian torsion. Electronically Signed   By: Rise Mu M.D.   On: 05/22/2017 02:00    Procedures Procedures (including critical care time)  Medications Ordered in ED Medications  HYDROcodone-acetaminophen (NORCO/VICODIN) 5-325 MG per tablet 1 tablet (has no administration in time range)  morphine 4 MG/ML injection 4 mg (4 mg Intravenous Given 05/22/17 0024)  sucralfate (CARAFATE) tablet 1 g (1 g Oral Given 05/22/17 0024)     Initial Impression / Assessment and Plan / ED Course  I have reviewed the triage vital signs and the nursing notes.  Pertinent labs & imaging results that were available during my care of the patient were reviewed by me and considered in my medical decision making (see chart for details).  Clinical Course as of May 22 229  Sat May 22, 2017  0229 US PELVIC COMPLETE W TRANSVAGINAL AND TORSION R/O [AN]  0229 Results from the ER workup discussed with the patient face to face and all questions answered to the best of my ability.  I do not think patient is having symptomatic cholelithiasis.  Ruptured ovarian cyst could have been the cause for her left lower quadrant pain.  Patient advised to see her gynecologist.  She has been informed to notify her primary care doctor and the GI doctor about the gallstones, and I have deferred the surgical consultation based on the evaluation by those teams -as the presenting symptoms to the ER did not seem consistent with cholelithiasis.  US Abdomen Complete [AN]  0230 Leukocytes, UA(!): LARGE [AN]  0230 Sample is potentially contaminated.  Patient is  mentioning frequent urination, however the symptoms are not new. Macrobid given, patient will start the prescription only if she develops UTI-like symptoms.  WBC, UA: TOO NUMEROUS TO COUNT [AN]    Clinical Course User Index [AN] Derwood Kaplan, MD    32 year old female comes in with chief complaint of abdominal pain.  Patient has had chronic abdominal pain now for the last several days.  Typically the pain starts early in the morning and gradually improves.  Patient also is having nonspecific symptoms such as early satiety, gas and burping.  Patient's  PCP has already treated patient with Bentyl, PPI -and there has been no improvement.  Based on my exam differential diagnosis includes cholelithiasis, diverticulitis, IBS, IBD, ovarian cyst/torsion, renal stone, pyelonephritis.  Patient has outpatient ultrasound ordered -but because her symptoms have gotten worse and she has a GI appointment coming up, we will get those images emergently, since the results can have bearing on the GI appointment. Abd exam is not peritoneal and pt is relatively healthy.  Final Clinical Impressions(s) / ED Diagnoses   Final diagnoses:  Pelvic pain  Pyuria  Calculus of gallbladder without cholecystitis without obstruction  Cyst of left ovary    ED Discharge Orders        Ordered    nitrofurantoin, macrocrystal-monohydrate, (MACROBID) 100 MG capsule  2 times daily     05/22/17 0229    acetaminophen (TYLENOL 8 HOUR) 650 MG CR tablet  Every 8 hours PRN     05/22/17 0229       Derwood KaplanNanavati, Maanasa Aderhold, MD 05/22/17 0235

## 2017-05-22 NOTE — Discharge Instructions (Signed)
We saw you in the emergency room for abdominal pain. The ultrasound in the ER shows that you had left-sided ovarian cyst and that you also have gallstones.  The urine also showed some inflammatory cells.  For now we recommend that you take the antibiotics only if you start having pain with urination or frequent urination. We would like you to see your GI doctor and your gynecologist. See the general surgeons only if the GI doctors recommend doing so.  The symptoms that you are having in the ER were not concerning enough for us to feel that you need to see a general surgeon right away.

## 2017-05-27 ENCOUNTER — Other Ambulatory Visit: Payer: Self-pay

## 2017-07-01 ENCOUNTER — Ambulatory Visit: Payer: Self-pay | Admitting: Surgery

## 2017-07-01 NOTE — H&P (View-Only) (Signed)
Jasmine Huerta Documented: 07/01/2017 9:24 AM Location: [facility] Patient #: [id number] DOB: 1985/08/29 Married / Language: [name] / Race: Undefined Female  History of Present Illness (Jasmine Huerta A. Jasmine Bonine MD; 07/01/2017 9:50 AM) Patient words: 32 year old woman referred by Dr. Marca Huerta for abdominal pain in the setting of gallstones. For the last year she complained of indigestion and has been taking Prilosec with no relief. Pain was sporadic and occasionally does occur in her upper quadrant at which time it is sharp and very brief and quality. Also notes heartburn and epigastric pain with constant belching.. Associated with nausea, vomiting. She does avoid fatty foods but any food makes her nauseated. Her main concern is actually left lower sided abdominal pain which she describes as feeling like her in size or twisting, this wakes her up 1 or 2 times a night and has become more irregular than it had in the past. She does have a history of irritable bowel syndrome with constipation and can go one to 2 weeks without a bowel movement.  She presented to the ER on April 19 with several days of pain. Workup included CMP CBC normal, ultrasound showed gallstones, common bile duct 4.5 mm. Also had a pelvic ultrasound that showed a 1 cm left ovarian cyst with small volume free physiologic fluid in the pelvis no other acute abnormalities. No torsion. She was to undergo an upper endoscopy however has not yet scheduled that.  Medical history includes arthritis, migraines, hyperlipidemia, prediabetes, irritable bowel syndrome with constipation, ovarian cyst  Surgical history tubal ligation Cetirizine, fentanyl, iron, Macrobid, tramadol, OCP, Flexeril.  The patient is a 32 year old female.   Diagnostic Studies History Jasmine Huerta; 07/01/2017 9:25 AM) Colonoscopy never Mammogram never Pap Smear 1-5 years ago  Medication History Jasmine Huerta; 07/01/2017 9:28 AM) TraMADol HCl (   Tablet, Oral) Active. Cetirizine HCl (  Tablet, Oral) Active. Cyclobenzaprine HCl (  Tablet, Oral) Active. Meloxicam (  Tablet, Oral) Active. Nitrofurantoin Monohyd Macro (  Capsule, Oral) Active. Promethazine HCl (12.5MG  Tablet, Oral) Active. Dicyclomine HCl (  Tablet, Oral) Active. Fluticasone Propionate (50MCG/ACT Suspension, Nasal) Active. Polymyxin B-Trimethoprim (10000-0.1UNIT/ML-% Solution, Ophthalmic) Active. Medications Reconciled  Social History Jasmine Huerta; 07/01/2017 9:25 AM) Alcohol use Occasional alcohol use. Caffeine use Carbonated beverages. No drug use Tobacco use Never smoker.  Family History Jasmine Huerta; 07/01/2017 9:25 AM) Alcohol Abuse Father. Arthritis Mother. Diabetes Mellitus Mother, Sister.  Pregnancy / Birth History Jasmine Huerta; 07/01/2017 9:25 AM) Age at menarche 11 years. Gravida 5 Length (months) of breastfeeding 3-6 Maternal age 84-20 Para 2 Regular periods  Other Problems Jasmine Huerta; 07/01/2017 9:25 AM) Anxiety Disorder Back Pain Cholelithiasis Depression Other disease, cancer, significant illness  Note:Seasonal Allergies     Review of Systems (Jasmine Huerta; 07/01/2017 9:25 AM) General Present- Appetite Loss, Chills, Fatigue, Night Sweats and Weight Gain. Not Present- Fever and Weight Loss. HEENT Present- Seasonal Allergies. Not Present- Earache, Hearing Loss, Hoarseness, Nose Bleed, Oral Ulcers, Ringing in the Ears, Sinus Pain, Sore Throat, Visual Disturbances, Wears glasses/contact lenses and Yellow Eyes. Breast Not Present- Breast Mass, Breast Pain, Nipple Discharge and Skin Changes. Cardiovascular Not Present- Chest Pain, Difficulty Breathing Lying Down, Leg Cramps, Palpitations, Rapid Heart Rate, Shortness of Breath and Swelling of Extremities. Gastrointestinal Present- Abdominal Pain, Bloating, Change in Bowel Habits, Constipation, Indigestion, Nausea and Vomiting. Not  Present- Bloody Stool, Chronic diarrhea, Difficulty Swallowing, Excessive gas, Gets full quickly at meals, Hemorrhoids and Rectal Pain. Female Genitourinary Present- Frequency and Urgency. Not Present- Nocturia,  Painful Urination and Pelvic Pain. Musculoskeletal Present- Back Pain, Joint Pain, Joint Stiffness and Muscle Pain. Not Present- Muscle Weakness and Swelling of Extremities. Neurological Present- Headaches. Not Present- Decreased Memory, Fainting, Numbness, Seizures, Tingling, Tremor, Trouble walking and Weakness. Psychiatric Present- Anxiety, Change in Sleep Pattern and Depression. Not Present- Bipolar, Fearful and Frequent crying. Endocrine Not Present- Cold Intolerance, Excessive Hunger, Hair Changes, Heat Intolerance, Hot flashes and New Diabetes. Hematology Not Present- Blood Thinners, Easy Bruising, Excessive bleeding, Gland problems, HIV and Persistent Infections.  Vitals (Jasmine Huerta; 07/01/2017 9:25 AM) 07/01/2017 9:25 AM Weight: 145.5 lb Height: 61in Body Surface Area: 1.65 m Body Mass Index: 27.49 kg/m  Temp.: 97.81F  Pulse: 105 (Regular)  P.OX: 98% (Room air) BP: 104/78 (Sitting, Left Arm, Standard)      Physical Exam (Jasmine Huerta A. Jasmine Bonine MD; 07/01/2017 9:51 AM)  The physical exam findings are as follows: Note:Gen: alert and well appearing. She is hiccuping and belching and has a CPAP on her left abdominal wall. Eye: extraocular motion intact, no scleral icterus ENT: moist mucus membranes, dentition intact Neck: no mass or thyromegaly Chest: unlabored respirations, symmetrical air entry, clear bilaterally CV: regular rate and rhythm, no pedal edema Abdomen: soft, nondistended. No mass or organomegaly. Mildly diffusely tender. MSK: strength symmetrical throughout, no deformity Neuro: grossly intact, normal gait Psych: normal mood and affect, appropriate insight Skin: warm and dry, no rash or lesion on limited exam    Assessment & Plan (Jasmine Huerta A.  Jasmine Bonine MD; 07/01/2017 9:53 AM)  BILIARY COLIC (K80.50) Story: We had a long discussion today. She does have a variety of symptoms, and I explained that while the nausea and epigastric pain may be attributable to biliary colic, gallbladder surgery will not improve her left lower quadrant pain or constipation. I also explained that she may continue to have nausea and indigestion after gallbladder surgery if the gallstones are not the etiology of the symptoms. We discussed the options of proceeding with the gastrointestinal workup including upper endoscopy and improving her bowel regimen and bowel habits first versus proceeding with laparoscopic cholecystectomy. I discussed with her the risks of surgery including bleeding, infection, pain, scarring, injury to intra-abdominal structures specifically the common bile duct and sequelae, conversion to open surgery, blood clot, heart attack, pneumonia, stroke, and again failure to resolve symptoms. She expressed understanding. Questions were answered to her satisfaction. After a long discussion she would like to proceed with laparoscopic cholecystectomy.

## 2017-07-01 NOTE — H&P (Signed)
Jasmine Huerta Documented: 07/01/2017 9:24 AM Location: [facility] Patient #: [id number] DOB: 1985/08/29 Married / Language: Jasmine Huerta / Race: Undefined Female  History of Present Illness (Jasmine Huerta A. Jasmine Bonine MD; 07/01/2017 9:50 AM) Patient words: 32 year old woman referred by Dr. Marca Ancona for abdominal pain in the setting of gallstones. For the last year she complained of indigestion and has been taking Prilosec with no relief. Pain was sporadic and occasionally does occur in her upper quadrant at which time it is sharp and very brief and quality. Also notes heartburn and epigastric pain with constant belching.. Associated with nausea, vomiting. She does avoid fatty foods but any food makes her nauseated. Her main concern is actually left lower sided abdominal pain which she describes as feeling like her in size or twisting, this wakes her up 1 or 2 times a night and has become more irregular than it had in the past. She does have a history of irritable bowel syndrome with constipation and can go one to 2 weeks without a bowel movement.  She presented to the ER on April 19 with several days of pain. Workup included CMP CBC normal, ultrasound showed gallstones, common bile duct 4.5 mm. Also had a pelvic ultrasound that showed a 1 cm left ovarian cyst with small volume free physiologic fluid in the pelvis no other acute abnormalities. No torsion. She was to undergo an upper endoscopy however has not yet scheduled that.  Medical history includes arthritis, migraines, hyperlipidemia, prediabetes, irritable bowel syndrome with constipation, ovarian cyst  Surgical history tubal ligation Cetirizine, fentanyl, iron, Macrobid, tramadol, OCP, Flexeril.  The patient is a 32 year old female.   Diagnostic Studies History Jasmine Huerta, CMA; 07/01/2017 9:25 AM) Colonoscopy never Mammogram never Pap Smear 1-5 years ago  Medication History Jasmine Huerta, CMA; 07/01/2017 9:28 AM) TraMADol HCl (   Tablet, Oral) Active. Cetirizine HCl (  Tablet, Oral) Active. Cyclobenzaprine HCl (  Tablet, Oral) Active. Meloxicam (  Tablet, Oral) Active. Nitrofurantoin Monohyd Macro (  Capsule, Oral) Active. Promethazine HCl (12.5MG  Tablet, Oral) Active. Dicyclomine HCl (  Tablet, Oral) Active. Fluticasone Propionate (50MCG/ACT Suspension, Nasal) Active. Polymyxin B-Trimethoprim (10000-0.1UNIT/ML-% Solution, Ophthalmic) Active. Medications Reconciled  Social History Jasmine Huerta, CMA; 07/01/2017 9:25 AM) Alcohol use Occasional alcohol use. Caffeine use Carbonated beverages. No drug use Tobacco use Never smoker.  Family History Jasmine Huerta, CMA; 07/01/2017 9:25 AM) Alcohol Abuse Father. Arthritis Mother. Diabetes Mellitus Mother, Sister.  Pregnancy / Birth History Jasmine Huerta, CMA; 07/01/2017 9:25 AM) Age at menarche 11 years. Gravida 5 Length (months) of breastfeeding 3-6 Maternal age 84-20 Para 2 Regular periods  Other Problems Jasmine Huerta, CMA; 07/01/2017 9:25 AM) Anxiety Disorder Back Pain Cholelithiasis Depression Other disease, cancer, significant illness  Note:Seasonal Allergies     Review of Systems (Jasmine Huerta CMA; 07/01/2017 9:25 AM) General Present- Appetite Loss, Chills, Fatigue, Night Sweats and Weight Gain. Not Present- Fever and Weight Loss. HEENT Present- Seasonal Allergies. Not Present- Earache, Hearing Loss, Hoarseness, Nose Bleed, Oral Ulcers, Ringing in the Ears, Sinus Pain, Sore Throat, Visual Disturbances, Wears glasses/contact lenses and Yellow Eyes. Breast Not Present- Breast Mass, Breast Pain, Nipple Discharge and Skin Changes. Cardiovascular Not Present- Chest Pain, Difficulty Breathing Lying Down, Leg Cramps, Palpitations, Rapid Heart Rate, Shortness of Breath and Swelling of Extremities. Gastrointestinal Present- Abdominal Pain, Bloating, Change in Bowel Habits, Constipation, Indigestion, Nausea and Vomiting. Not  Present- Bloody Stool, Chronic diarrhea, Difficulty Swallowing, Excessive gas, Gets full quickly at meals, Hemorrhoids and Rectal Pain. Female Genitourinary Present- Frequency and Urgency. Not Present- Nocturia,  Painful Urination and Pelvic Pain. Musculoskeletal Present- Back Pain, Joint Pain, Joint Stiffness and Muscle Pain. Not Present- Muscle Weakness and Swelling of Extremities. Neurological Present- Headaches. Not Present- Decreased Memory, Fainting, Numbness, Seizures, Tingling, Tremor, Trouble walking and Weakness. Psychiatric Present- Anxiety, Change in Sleep Pattern and Depression. Not Present- Bipolar, Fearful and Frequent crying. Endocrine Not Present- Cold Intolerance, Excessive Hunger, Hair Changes, Heat Intolerance, Hot flashes and New Diabetes. Hematology Not Present- Blood Thinners, Easy Bruising, Excessive bleeding, Gland problems, HIV and Persistent Infections.  Vitals (Jasmine Huerta CMA; 07/01/2017 9:25 AM) 07/01/2017 9:25 AM Weight: 145.5 lb Height: 61in Body Surface Area: 1.65 m Body Mass Index: 27.49 kg/m  Temp.: 97.9F  Pulse: 105 (Regular)  P.OX: 98% (Room air) BP: 104/78 (Sitting, Left Arm, Standard)      Physical Exam (Jasmine Huerta A. Jasmine Shrestha MD; 07/01/2017 9:51 AM)  The physical exam findings are as follows: Note:Gen: alert and well appearing. She is hiccuping and belching and has a CPAP on her left abdominal wall. Eye: extraocular motion intact, no scleral icterus ENT: moist mucus membranes, dentition intact Neck: no mass or thyromegaly Chest: unlabored respirations, symmetrical air entry, clear bilaterally CV: regular rate and rhythm, no pedal edema Abdomen: soft, nondistended. No mass or organomegaly. Mildly diffusely tender. MSK: strength symmetrical throughout, no deformity Neuro: grossly intact, normal gait Psych: normal mood and affect, appropriate insight Skin: warm and dry, no rash or lesion on limited exam    Assessment & Plan (Jasmine Huerta A.  Jasmine Blankenbeckler MD; 07/01/2017 9:53 AM)  BILIARY COLIC (K80.50) Story: We had a long discussion today. She does have a variety of symptoms, and I explained that while the nausea and epigastric pain may be attributable to biliary colic, gallbladder surgery will not improve her left lower quadrant pain or constipation. I also explained that she may continue to have nausea and indigestion after gallbladder surgery if the gallstones are not the etiology of the symptoms. We discussed the options of proceeding with the gastrointestinal workup including upper endoscopy and improving her bowel regimen and bowel habits first versus proceeding with laparoscopic cholecystectomy. I discussed with her the risks of surgery including bleeding, infection, pain, scarring, injury to intra-abdominal structures specifically the common bile duct and sequelae, conversion to open surgery, blood clot, heart attack, pneumonia, stroke, and again failure to resolve symptoms. She expressed understanding. Questions were answered to her satisfaction. After a long discussion she would like to proceed with laparoscopic cholecystectomy.  

## 2017-07-02 ENCOUNTER — Other Ambulatory Visit: Payer: Self-pay

## 2017-07-02 ENCOUNTER — Encounter (HOSPITAL_COMMUNITY): Payer: Self-pay | Admitting: *Deleted

## 2017-07-04 NOTE — Anesthesia Preprocedure Evaluation (Addendum)
Anesthesia Evaluation  Patient identified by MRN, date of birth, ID band Patient awake    Reviewed: Allergy & Precautions, NPO status , Patient's Chart, lab work & pertinent test results  Airway Mallampati: II  TM Distance: >3 FB Neck ROM: Full    Dental no notable dental hx. (+) Teeth Intact, Dental Advidsory Given   Pulmonary neg pulmonary ROS,    Pulmonary exam normal breath sounds clear to auscultation       Cardiovascular negative cardio ROS Normal cardiovascular exam Rhythm:Regular Rate:Normal     Neuro/Psych  Headaches, PSYCHIATRIC DISORDERS Anxiety Depression Bipolar Disorder    GI/Hepatic Neg liver ROS, GERD  Medicated and Controlled,Cholelithiasis- symptomatic   Endo/Other  diabetes, Poorly Controlled, Type 2  Renal/GU negative Renal ROS  negative genitourinary   Musculoskeletal  (+) Arthritis ,   Abdominal   Peds  Hematology negative hematology ROS (+)   Anesthesia Other Findings   Reproductive/Obstetrics                           Anesthesia Physical Anesthesia Plan  ASA: II  Anesthesia Plan: General   Post-op Pain Management:    Induction: Intravenous and Cricoid pressure planned  PONV Risk Score and Plan: 4 or greater and Scopolamine patch - Pre-op, Midazolam, Ondansetron and Treatment may vary due to age or medical condition  Airway Management Planned: Oral ETT  Additional Equipment:   Intra-op Plan:   Post-operative Plan: Extubation in OR  Informed Consent: I have reviewed the patients History and Physical, chart, labs and discussed the procedure including the risks, benefits and alternatives for the proposed anesthesia with the patient or authorized representative who has indicated his/her understanding and acceptance.   Dental advisory given and Dental Advisory Given  Plan Discussed with: Surgeon and CRNA  Anesthesia Plan Comments:       Anesthesia  Quick Evaluation

## 2017-07-05 ENCOUNTER — Ambulatory Visit (HOSPITAL_COMMUNITY)
Admission: RE | Admit: 2017-07-05 | Discharge: 2017-07-05 | Disposition: A | Discharge status: Home / Self Care | Payer: 59 | Source: Ambulatory Visit | Attending: Surgery | Admitting: Surgery

## 2017-07-05 ENCOUNTER — Encounter (HOSPITAL_COMMUNITY): Payer: Self-pay | Admitting: Anesthesiology

## 2017-07-05 ENCOUNTER — Ambulatory Visit (HOSPITAL_COMMUNITY): Payer: 59 | Admitting: Anesthesiology

## 2017-07-05 ENCOUNTER — Encounter (HOSPITAL_COMMUNITY)
Admission: RE | Disposition: A | Discharge status: Home / Self Care | Payer: Self-pay | Source: Ambulatory Visit | Attending: Surgery

## 2017-07-05 DIAGNOSIS — M549 Dorsalgia, unspecified: Secondary | ICD-10-CM | POA: Diagnosis not present

## 2017-07-05 DIAGNOSIS — Z79899 Other long term (current) drug therapy: Secondary | ICD-10-CM | POA: Insufficient documentation

## 2017-07-05 DIAGNOSIS — K5900 Constipation, unspecified: Secondary | ICD-10-CM | POA: Insufficient documentation

## 2017-07-05 DIAGNOSIS — R7303 Prediabetes: Secondary | ICD-10-CM | POA: Diagnosis not present

## 2017-07-05 DIAGNOSIS — Z833 Family history of diabetes mellitus: Secondary | ICD-10-CM | POA: Insufficient documentation

## 2017-07-05 DIAGNOSIS — Z8261 Family history of arthritis: Secondary | ICD-10-CM | POA: Insufficient documentation

## 2017-07-05 DIAGNOSIS — K8064 Calculus of gallbladder and bile duct with chronic cholecystitis without obstruction: Secondary | ICD-10-CM | POA: Diagnosis present

## 2017-07-05 DIAGNOSIS — Z811 Family history of alcohol abuse and dependence: Secondary | ICD-10-CM | POA: Insufficient documentation

## 2017-07-05 DIAGNOSIS — M1990 Unspecified osteoarthritis, unspecified site: Secondary | ICD-10-CM | POA: Diagnosis not present

## 2017-07-05 DIAGNOSIS — F419 Anxiety disorder, unspecified: Secondary | ICD-10-CM | POA: Insufficient documentation

## 2017-07-05 DIAGNOSIS — E785 Hyperlipidemia, unspecified: Secondary | ICD-10-CM | POA: Insufficient documentation

## 2017-07-05 DIAGNOSIS — K8010 Calculus of gallbladder with chronic cholecystitis without obstruction: Secondary | ICD-10-CM | POA: Diagnosis not present

## 2017-07-05 DIAGNOSIS — K589 Irritable bowel syndrome without diarrhea: Secondary | ICD-10-CM | POA: Diagnosis not present

## 2017-07-05 DIAGNOSIS — F319 Bipolar disorder, unspecified: Secondary | ICD-10-CM | POA: Insufficient documentation

## 2017-07-05 DIAGNOSIS — Z9851 Tubal ligation status: Secondary | ICD-10-CM | POA: Insufficient documentation

## 2017-07-05 DIAGNOSIS — K828 Other specified diseases of gallbladder: Secondary | ICD-10-CM | POA: Diagnosis not present

## 2017-07-05 DIAGNOSIS — G43909 Migraine, unspecified, not intractable, without status migrainosus: Secondary | ICD-10-CM | POA: Insufficient documentation

## 2017-07-05 HISTORY — DX: Depression, unspecified: F32.A

## 2017-07-05 HISTORY — DX: Headache: R51

## 2017-07-05 HISTORY — DX: Headache, unspecified: R51.9

## 2017-07-05 HISTORY — DX: Gastro-esophageal reflux disease without esophagitis: K21.9

## 2017-07-05 HISTORY — DX: Anxiety disorder, unspecified: F41.9

## 2017-07-05 HISTORY — PX: CHOLECYSTECTOMY: SHX55

## 2017-07-05 HISTORY — DX: Unspecified osteoarthritis, unspecified site: M19.90

## 2017-07-05 HISTORY — DX: Prediabetes: R73.03

## 2017-07-05 HISTORY — DX: Major depressive disorder, single episode, unspecified: F32.9

## 2017-07-05 LAB — BASIC METABOLIC PANEL
Anion gap: 6 (ref 5–15)
BUN: 11 mg/dL (ref 6–20)
CO2: 24 mmol/L (ref 22–32)
CREATININE: 0.76 mg/dL (ref 0.44–1.00)
Calcium: 8.5 mg/dL — ABNORMAL LOW (ref 8.9–10.3)
Chloride: 107 mmol/L (ref 101–111)
GFR calc non Af Amer: >60 mL/min (ref >60)
Glucose, Bld: 129 mg/dL — ABNORMAL HIGH (ref 65–99)
POTASSIUM: 3.6 mmol/L (ref 3.5–5.1)
Sodium: 137 mmol/L (ref 135–145)

## 2017-07-05 LAB — CBC
HCT: 38 % (ref 36.0–46.0)
Hemoglobin: 12.2 g/dL (ref 12.0–15.0)
MCH: 26.2 pg (ref 26.0–34.0)
MCHC: 32.1 g/dL (ref 30.0–36.0)
MCV: 81.7 fL (ref 78.0–100.0)
Platelets: 224 10*3/uL (ref 150–400)
RBC: 4.65 MIL/uL (ref 3.87–5.11)
RDW: 13.6 % (ref 11.5–15.5)
WBC: 8.1 10*3/uL (ref 4.0–10.5)

## 2017-07-05 LAB — GLUCOSE, CAPILLARY
GLUCOSE-CAPILLARY: 131 mg/dL — AB (ref 65–99)
GLUCOSE-CAPILLARY: 202 mg/dL — AB (ref 65–99)

## 2017-07-05 LAB — HCG, SERUM, QUALITATIVE: Preg, Serum: NEGATIVE

## 2017-07-05 SURGERY — LAPAROSCOPIC CHOLECYSTECTOMY
Anesthesia: General | Site: Abdomen

## 2017-07-05 MED ORDER — FENTANYL CITRATE (PF) 250 MCG/5ML IJ SOLN
INTRAMUSCULAR | Status: AC
  Filled 2017-07-05: qty 5

## 2017-07-05 MED ORDER — HYDROCODONE-ACETAMINOPHEN 5-325 MG PO TABS: 1.0000 | ORAL_TABLET | Freq: Four times a day (QID) | ORAL | 0 refills | Status: DC | PRN

## 2017-07-05 MED ORDER — DOCUSATE SODIUM 100 MG PO CAPS: 100.0000 mg | ORAL_CAPSULE | Freq: Two times a day (BID) | ORAL | 0 refills | Status: AC

## 2017-07-05 MED ORDER — SODIUM CHLORIDE 0.9 % IR SOLN
Status: DC | PRN
  Administered 2017-07-05: 1000 mL

## 2017-07-05 MED ORDER — STERILE WATER FOR IRRIGATION IR SOLN
Status: DC | PRN
  Administered 2017-07-05: 500 mL

## 2017-07-05 MED ORDER — ROCURONIUM BROMIDE 100 MG/10ML IV SOLN
INTRAVENOUS | Status: DC | PRN
  Administered 2017-07-05: 50 mg via INTRAVENOUS

## 2017-07-05 MED ORDER — ACETAMINOPHEN 10 MG/ML IV SOLN
INTRAVENOUS | Status: DC | PRN
  Administered 2017-07-05: 1000 mg via INTRAVENOUS

## 2017-07-05 MED ORDER — ACETAMINOPHEN 10 MG/ML IV SOLN
INTRAVENOUS | Status: AC
  Filled 2017-07-05: qty 100

## 2017-07-05 MED ORDER — CELECOXIB 200 MG PO CAPS
ORAL_CAPSULE | ORAL | Status: AC
  Filled 2017-07-05: qty 1

## 2017-07-05 MED ORDER — CHLORHEXIDINE GLUCONATE 4 % EX LIQD: 60.0000 mL | Freq: Once | CUTANEOUS | Status: DC

## 2017-07-05 MED ORDER — LIDOCAINE HCL (CARDIAC) PF 100 MG/5ML IV SOSY
PREFILLED_SYRINGE | INTRAVENOUS | Status: DC | PRN
  Administered 2017-07-05: 75 mg via INTRAVENOUS

## 2017-07-05 MED ORDER — HYDROMORPHONE HCL 2 MG/ML IJ SOLN
0.2500 mg | INTRAMUSCULAR | Status: DC | PRN
  Administered 2017-07-05 (×4): 0.5 mg via INTRAVENOUS

## 2017-07-05 MED ORDER — SODIUM CHLORIDE 0.9% FLUSH: 3.0000 mL | INTRAVENOUS | Status: DC | PRN

## 2017-07-05 MED ORDER — BUPIVACAINE-EPINEPHRINE 0.25% -1:200000 IJ SOLN
INTRAMUSCULAR | Status: DC | PRN
  Administered 2017-07-05: 30 mL

## 2017-07-05 MED ORDER — CEFAZOLIN SODIUM-DEXTROSE 2-4 GM/100ML-% IV SOLN
2.0000 g | INTRAVENOUS | Status: AC
  Administered 2017-07-05: 2 g via INTRAVENOUS

## 2017-07-05 MED ORDER — METOCLOPRAMIDE HCL 5 MG/ML IJ SOLN
INTRAMUSCULAR | Status: AC
  Filled 2017-07-05: qty 2

## 2017-07-05 MED ORDER — HYDROMORPHONE HCL 2 MG/ML IJ SOLN
INTRAMUSCULAR | Status: AC
  Administered 2017-07-05: 0.5 mg via INTRAVENOUS
  Filled 2017-07-05: qty 1

## 2017-07-05 MED ORDER — MEPERIDINE HCL 50 MG/ML IJ SOLN: 6.2500 mg | INTRAMUSCULAR | Status: DC | PRN

## 2017-07-05 MED ORDER — PROPOFOL 10 MG/ML IV BOLUS
INTRAVENOUS | Status: AC
  Filled 2017-07-05: qty 20

## 2017-07-05 MED ORDER — SCOPOLAMINE 1 MG/3DAYS TD PT72
MEDICATED_PATCH | TRANSDERMAL | Status: AC
  Filled 2017-07-05: qty 1

## 2017-07-05 MED ORDER — ACETAMINOPHEN 650 MG RE SUPP: 650.0000 mg | RECTAL | Status: DC | PRN

## 2017-07-05 MED ORDER — FENTANYL CITRATE (PF) 100 MCG/2ML IJ SOLN: 25.0000 ug | INTRAMUSCULAR | Status: DC | PRN

## 2017-07-05 MED ORDER — SCOPOLAMINE 1 MG/3DAYS TD PT72
MEDICATED_PATCH | TRANSDERMAL | Status: DC | PRN
  Administered 2017-07-05: 1 via TRANSDERMAL

## 2017-07-05 MED ORDER — ACETAMINOPHEN 500 MG PO TABS: 1000.0000 mg | ORAL_TABLET | ORAL | Status: DC

## 2017-07-05 MED ORDER — PROPOFOL 10 MG/ML IV BOLUS
INTRAVENOUS | Status: DC | PRN
  Administered 2017-07-05: 175 mg via INTRAVENOUS

## 2017-07-05 MED ORDER — DEXAMETHASONE SODIUM PHOSPHATE 10 MG/ML IJ SOLN
INTRAMUSCULAR | Status: DC | PRN
  Administered 2017-07-05: 10 mg via INTRAVENOUS

## 2017-07-05 MED ORDER — BUPIVACAINE-EPINEPHRINE (PF) 0.25% -1:200000 IJ SOLN
INTRAMUSCULAR | Status: AC
  Filled 2017-07-05: qty 30

## 2017-07-05 MED ORDER — SUGAMMADEX SODIUM 200 MG/2ML IV SOLN
INTRAVENOUS | Status: DC | PRN
  Administered 2017-07-05: 300 mg via INTRAVENOUS

## 2017-07-05 MED ORDER — FENTANYL CITRATE (PF) 100 MCG/2ML IJ SOLN
INTRAMUSCULAR | Status: DC | PRN
  Administered 2017-07-05: 50 ug via INTRAVENOUS
  Administered 2017-07-05: 150 ug via INTRAVENOUS
  Administered 2017-07-05: 50 ug via INTRAVENOUS

## 2017-07-05 MED ORDER — ACETAMINOPHEN 500 MG PO TABS
ORAL_TABLET | ORAL | Status: AC
  Filled 2017-07-05: qty 2

## 2017-07-05 MED ORDER — 0.9 % SODIUM CHLORIDE (POUR BTL) OPTIME
TOPICAL | Status: DC | PRN
  Administered 2017-07-05: 1000 mL

## 2017-07-05 MED ORDER — GABAPENTIN 300 MG PO CAPS
300.0000 mg | ORAL_CAPSULE | ORAL | Status: AC
  Administered 2017-07-05: 300 mg via ORAL

## 2017-07-05 MED ORDER — MIDAZOLAM HCL 2 MG/2ML IJ SOLN
INTRAMUSCULAR | Status: AC
  Filled 2017-07-05: qty 2

## 2017-07-05 MED ORDER — MIDAZOLAM HCL 5 MG/5ML IJ SOLN
INTRAMUSCULAR | Status: DC | PRN
  Administered 2017-07-05: 2 mg via INTRAVENOUS

## 2017-07-05 MED ORDER — LACTATED RINGERS IV SOLN
INTRAVENOUS | Status: DC | PRN
  Administered 2017-07-05 (×2): via INTRAVENOUS

## 2017-07-05 MED ORDER — BUPIVACAINE LIPOSOME 1.3 % IJ SUSP
20.0000 mL | Freq: Once | INTRAMUSCULAR | Status: DC
  Filled 2017-07-05: qty 20

## 2017-07-05 MED ORDER — CELECOXIB 200 MG PO CAPS
200.0000 mg | ORAL_CAPSULE | ORAL | Status: AC
  Administered 2017-07-05: 200 mg via ORAL

## 2017-07-05 MED ORDER — SODIUM CHLORIDE 0.9% FLUSH: 3.0000 mL | Freq: Two times a day (BID) | INTRAVENOUS | Status: DC

## 2017-07-05 MED ORDER — OXYCODONE HCL 5 MG/5ML PO SOLN
ORAL | Status: AC
  Administered 2017-07-05: 5 mg
  Filled 2017-07-05: qty 5

## 2017-07-05 MED ORDER — ONDANSETRON HCL 4 MG/2ML IJ SOLN
INTRAMUSCULAR | Status: DC | PRN
  Administered 2017-07-05: 4 mg via INTRAVENOUS

## 2017-07-05 MED ORDER — ACETAMINOPHEN 325 MG PO TABS: 650.0000 mg | ORAL_TABLET | ORAL | Status: DC | PRN

## 2017-07-05 MED ORDER — METOCLOPRAMIDE HCL 5 MG/ML IJ SOLN
10.0000 mg | Freq: Once | INTRAMUSCULAR | Status: AC | PRN
  Administered 2017-07-05: 10 mg via INTRAVENOUS

## 2017-07-05 MED ORDER — OXYCODONE HCL 5 MG PO TABS: 5.0000 mg | ORAL_TABLET | ORAL | Status: DC | PRN

## 2017-07-05 MED ORDER — SODIUM CHLORIDE 0.9 % IV SOLN: 250.0000 mL | INTRAVENOUS | Status: DC | PRN

## 2017-07-05 MED ORDER — SUCCINYLCHOLINE CHLORIDE 20 MG/ML IJ SOLN
INTRAMUSCULAR | Status: DC | PRN
  Administered 2017-07-05: 100 mg via INTRAVENOUS

## 2017-07-05 MED ORDER — GABAPENTIN 300 MG PO CAPS
ORAL_CAPSULE | ORAL | Status: AC
  Filled 2017-07-05: qty 1

## 2017-07-05 SURGICAL SUPPLY — 43 items
ADH SKN CLS APL DERMABOND .7 (GAUZE/BANDAGES/DRESSINGS) ×1
APPLIER CLIP 5 13 M/L LIGAMAX5 (MISCELLANEOUS) ×2
APR CLP MED LRG 5 ANG JAW (MISCELLANEOUS) ×1
BAG SPEC RTRVL LRG 6X4 10 (ENDOMECHANICALS) ×1
BLADE CLIPPER SURG (BLADE) IMPLANT
CANISTER SUCT 3000ML PPV (MISCELLANEOUS) ×2 IMPLANT
CHLORAPREP W/TINT 26ML (MISCELLANEOUS) ×2 IMPLANT
CLIP APPLIE 5 13 M/L LIGAMAX5 (MISCELLANEOUS) ×1 IMPLANT
COVER SURGICAL LIGHT HANDLE (MISCELLANEOUS) ×2 IMPLANT
DERMABOND ADVANCED (GAUZE/BANDAGES/DRESSINGS) ×1
DERMABOND ADVANCED .7 DNX12 (GAUZE/BANDAGES/DRESSINGS) ×1 IMPLANT
ELECT REM PT RETURN 9FT ADLT (ELECTROSURGICAL) ×2
ELECTRODE REM PT RTRN 9FT ADLT (ELECTROSURGICAL) ×1 IMPLANT
GLOVE BIO SURGEON STRL SZ 6 (GLOVE) ×2 IMPLANT
GLOVE BIOGEL PI IND STRL 6.5 (GLOVE) ×1 IMPLANT
GLOVE BIOGEL PI IND STRL 7.0 (GLOVE) IMPLANT
GLOVE BIOGEL PI IND STRL 8 (GLOVE) IMPLANT
GLOVE BIOGEL PI INDICATOR 6.5 (GLOVE) ×1
GLOVE BIOGEL PI INDICATOR 7.0 (GLOVE) ×2
GLOVE BIOGEL PI INDICATOR 8 (GLOVE) ×1
GLOVE SURG SS PI 6.5 STRL IVOR (GLOVE) ×3 IMPLANT
GOWN STRL REUS W/ TWL LRG LVL3 (GOWN DISPOSABLE) ×3 IMPLANT
GOWN STRL REUS W/TWL LRG LVL3 (GOWN DISPOSABLE) ×8
GRASPER SUT TROCAR 14GX15 (MISCELLANEOUS) ×2 IMPLANT
KIT BASIN OR (CUSTOM PROCEDURE TRAY) ×2 IMPLANT
KIT TURNOVER KIT B (KITS) ×2 IMPLANT
NDL INSUFFLATION 14GA 120MM (NEEDLE) ×1 IMPLANT
NEEDLE INSUFFLATION 14GA 120MM (NEEDLE) ×2 IMPLANT
NS IRRIG 1000ML POUR BTL (IV SOLUTION) ×2 IMPLANT
PAD ARMBOARD 7.5X6 YLW CONV (MISCELLANEOUS) ×2 IMPLANT
POUCH SPECIMEN RETRIEVAL 10MM (ENDOMECHANICALS) ×2 IMPLANT
SCISSORS LAP 5X35 DISP (ENDOMECHANICALS) ×2 IMPLANT
SET IRRIG TUBING LAPAROSCOPIC (IRRIGATION / IRRIGATOR) ×2 IMPLANT
SLEEVE ENDOPATH XCEL 5M (ENDOMECHANICALS) ×4 IMPLANT
SPECIMEN JAR SMALL (MISCELLANEOUS) ×2 IMPLANT
SUT MNCRL AB 4-0 PS2 18 (SUTURE) ×2 IMPLANT
TOWEL OR 17X24 6PK STRL BLUE (TOWEL DISPOSABLE) ×2 IMPLANT
TOWEL OR 17X26 10 PK STRL BLUE (TOWEL DISPOSABLE) IMPLANT
TRAY LAPAROSCOPIC MC (CUSTOM PROCEDURE TRAY) ×2 IMPLANT
TROCAR XCEL NON-BLD 11X100MML (ENDOMECHANICALS) ×2 IMPLANT
TROCAR XCEL NON-BLD 5MMX100MML (ENDOMECHANICALS) ×2 IMPLANT
TUBING INSUFFLATION (TUBING) ×2 IMPLANT
WATER STERILE IRR 1000ML POUR (IV SOLUTION) ×2 IMPLANT

## 2017-07-05 NOTE — Op Note (Signed)
Operative Note  Kathalene Framesona Lada 32 y.o. female [id number]  07/05/2017  Surgeon: Berna Buehelsea A Cherl Gorney MD  Assistant: none  Procedure performed: Laparoscopic Cholecystectomy  Preop diagnosis: biliary colic Post-op diagnosis/intraop findings: chronic cholecystitis with hydrops; single band-like adhesion of omentum to left adnexa   Specimens: gallbladder  EBL: minimal  Complications: none  Description of procedure: After obtaining informed consent the patient was brought to the operating room. Prophylactic antibiotics were administered. SCD's were applied. General endotracheal anesthesia was initiated and a formal time-out was performed. The abdomen was prepped and draped in the usual sterile fashion and the abdomen was entered using an infraumbilical veress needle after instilling the site with local. Insufflation to 15mmHg was obtained and gross inspection revealed no evidence of injury from our entry or other intraabdominal abnormalities. Two 5mm trocars were introduced in the right midclavicular and right anterior axillary lines under direct visualization and following infiltration with local. An 11mm trocar was placed in the epigastrium. The gallbladder was retracted cephalad and the infundibulum was retracted laterally. The gallbladder was chronically inflamed appearing and a large stone was lodged in the neck. A combination of hook electrocautery and blunt dissection was utilized to clear the peritoneum from the neck and cystic duct, circumferentially isolating the cystic artery and cystic duct and lifting the gallbladder from the cystic plate. The critical view of safety was achieved with the cystic artery, cystic duct, and liver bed visualized between them with no other structures. The artery was clipped with a single clip proximally and distally and divided as was the cystic duct with three clips on the proximal end. The gallbladder was dissected from the liver plate using electrocautery. Once freed  the gallbladder was placed in an endocatch bag and removed through the epigastric trocar site. Clear, mucoid material had been spilled from the gallbladder during its dissection from the liver bed. This was aspirated and the right upper quadrant was irrigated copiously until the effluent was clear. Hemostasis was once again confirmed, and reinspection of the abdomen revealed no injuries. The clips were well opposed without any bile leak from the duct or the liver bed. The 11mm trocar site in the epigastrium was closed with a 0 vicryl in the fascia under direct visualization using a PMI device. The patient was then placed in Trendelenburg and the left hemiabdomen and pelvis inspected as she had complained of chronic episodic cramping left sided abdominal pain. The colon appeared normal and the rectum was distended with stool; there was however a band-like adhesion of omentum across the distal sigmoid colon to the left adnexa. This was lysed sharply. No other abnormalities were visualized.  The abdomen was desufflated and all trocars removed. The skin incisions were closed with running subcuticular monocryl and Dermabond. The patient was awakened, extubated and transported to the recovery room in stable condition.   All counts were correct at the completion of the case.

## 2017-07-05 NOTE — Anesthesia Procedure Notes (Signed)
Procedure Name: Intubation Date/Time: 07/05/2017 7:30 AM Performed by: Neldon Newport, CRNA Pre-anesthesia Checklist: Timeout performed, Patient being monitored, Suction available, Emergency Drugs available and Patient identified Patient Re-evaluated:Patient Re-evaluated prior to induction Oxygen Delivery Method: Circle system utilized Preoxygenation: Pre-oxygenation with 100% oxygen Induction Type: IV induction and Rapid sequence Laryngoscope Size: Mac and 3 Grade View: Grade I Tube type: Oral Tube size: 7.0 mm Number of attempts: 1 Placement Confirmation: breath sounds checked- equal and bilateral,  positive ETCO2 and ETT inserted through vocal cords under direct vision Secured at: 21 cm Tube secured with: Tape Dental Injury: Teeth and Oropharynx as per pre-operative assessment

## 2017-07-05 NOTE — Interval H&P Note (Signed)
History and Physical Interval Note:  07/05/2017 7:07 AM  Jasmine FramesIona Oestreicher  has presented today for surgery, with the diagnosis of biliary colic  The various methods of treatment have been discussed with the patient and family. After consideration of risks, benefits and other options for treatment, the patient has consented to  Procedure(s): LAPAROSCOPIC CHOLECYSTECTOMY (N/A) as a surgical intervention .  The patient's history has been reviewed, patient examined, no change in status, stable for surgery.  I have reviewed the patient's chart and labs.  Questions were answered to the patient's satisfaction.     Kamden Stanislaw Lollie SailsA Don Tiu

## 2017-07-05 NOTE — Discharge Instructions (Signed)
LAPAROSCOPIC SURGERY: POST OP INSTRUCTIONS  ######################################################################  EAT Gradually transition to a high fiber diet with a fiber supplement over the next few weeks after discharge.  Start with a pureed / full liquid diet (see below)  WALK Walk an hour a day.  Control your pain to do that.    CONTROL PAIN Control pain so that you can walk, sleep, tolerate sneezing/coughing, go up/down stairs.  HAVE A BOWEL MOVEMENT DAILY Keep your bowels regular to avoid problems.  OK to try a laxative to override constipation.  OK to use an antidairrheal to slow down diarrhea.  Call if not better after 2 tries  CALL IF YOU HAVE PROBLEMS/CONCERNS Call if you are still struggling despite following these instructions. Call if you have concerns not answered by these instructions  ######################################################################    1. DIET: Follow a light bland diet the first 24 hours after arrival home, such as soup, liquids, crackers, etc.  Be sure to include lots of fluids daily.  Avoid fast food or heavy meals as your are more likely to get nauseated.  Eat a low fat the next few days after surgery.   2. Take your usually prescribed home medications unless otherwise directed. 3. PAIN CONTROL: a. Pain is best controlled by a usual combination of three different methods TOGETHER: i. Ice/Heat ii. Over the counter pain medication iii. Prescription pain medication b. Most patients will experience some swelling and bruising around the incisions.  Ice packs or heating pads (30-60 minutes up to 6 times a day) will help. Use ice for the first few days to help decrease swelling and bruising, then switch to heat to help relax tight/sore spots and speed recovery.  Some people prefer to use ice alone, heat alone, alternating between ice & heat.  Experiment to what works for you.  Swelling and bruising can take several weeks to resolve.   c. It is  helpful to take an over-the-counter pain medication regularly for the first few weeks.  Choose one of the following that works best for you: i. Naproxen (Aleve, etc)  Two 251m tabs twice a day ii. Ibuprofen (Advil, etc) Three 2053mtabs four times a day (every meal & bedtime) iii. Acetaminophen (Tylenol, etc) 500-65044mour times a day (every meal & bedtime) d. A  prescription for pain medication (such as oxycodone, hydrocodone, etc) should be given to you upon discharge.  Take your pain medication as prescribed.  i. If you are having problems/concerns with the prescription medicine (does not control pain, nausea, vomiting, rash, itching, etc), please call us [phone] see if we need to switch you to a different pain medicine that will work better for you and/or control your side effect better. ii. If you need a refill on your pain medication, please contact your pharmacy.  They will contact our office to request authorization. Prescriptions will not be filled after 5 pm or on week-ends. 4. Avoid getting constipated.  Between the surgery and the pain medications, it is common to experience some constipation.  Increasing fluid intake and taking a fiber supplement (such as Metamucil, Citrucel, FiberCon, MiraLax, etc) 1-2 times a day regularly will usually help prevent this problem from occurring.  A mild laxative (prune juice, Milk of Magnesia, MiraLax, etc) should be taken according to package directions if there are no bowel movements after 48 hours.   5. Watch out for diarrhea.  If you have many loose bowel movements, simplify your diet to bland foods & liquids for  a few days.  Stop any stool softeners and decrease your fiber supplement.  Switching to mild anti-diarrheal medications (Kayopectate, Pepto Bismol) can help.  If this worsens or does not improve, please call us. 6. Wash / shower every day.  You may shower over the skin glue which is waterproof.  Continue to shower over incision(s) after  the dressing is off. 7. Skin glue will flake off after 2 weeks.  You may leave the incision open to air.  You may replace a dressing/Band-Aid to cover the incision for comfort if you wish.  8. ACTIVITIES as tolerated:   a. You may resume regular (light) daily activities beginning the next day--such as daily self-care, walking, climbing stairs--gradually increasing activities as tolerated.  If you can walk 30 minutes without difficulty, it is safe to try more intense activity such as jogging, treadmill, bicycling, low-impact aerobics, swimming, etc. b. Save the most intensive and strenuous activity for last such as sit-ups, heavy lifting, contact sports, etc  Refrain from any heavy lifting or straining until you are off narcotics for pain control.   c. DO NOT PUSH THROUGH PAIN.  Let pain be your guide: If it hurts to do something, don't do it.  Pain is your body warning you to avoid that activity for another week until the pain goes down. d. You may drive when you are no longer taking prescription pain medication, you can comfortably wear a seatbelt, and you can safely maneuver your car and apply brakes. e. Bonita QuinYou may have sexual intercourse when it is comfortable.  9. FOLLOW UP in our office a. Please call [facility] at [phone] to set up an appointment to see your surgeon in the office for a follow-up appointment approximately 2-3 weeks after your surgery. b. Make sure that you call for this appointment the day you arrive home to insure a convenient appointment time. 10. IF YOU HAVE DISABILITY OR FAMILY LEAVE FORMS, BRING THEM TO THE OFFICE FOR PROCESSING.  DO NOT GIVE THEM TO YOUR DOCTOR.   WHEN TO CALL US [phone]: 1. Poor pain control 2. Reactions / problems with new medications (rash/itching, nausea, etc)  3. Fever over 101.5 F (38.5 C) 4. Inability to urinate 5. Nausea and/or vomiting 6. Worsening swelling or bruising 7. Continued bleeding from incision. 8. Increased pain, redness, or  drainage from the incision   The clinic staff is available to answer your questions during regular business hours (8:30am-5pm).  Please dont hesitate to call and ask to speak to one of our nurses for clinical concerns.   If you have a medical emergency, go to the nearest emergency room or call 911.  A surgeon from [facility] is always on call at the [facility]   [facility], [place] [address], [address], [place], [place]  [address] ? MAIN: [phone] ? TOLL FREE: [phone] ?  FAX [phone] [email or web address]   Post Anesthesia Home Care Instructions  Activity: Get plenty of rest for the remainder of the day. A responsible individual must stay with you for 24 hours following the procedure.  For the next 24 hours, DO NOT: -Drive a car -Advertising copywriterperate machinery -Drink alcoholic beverages -Take any medication unless instructed by your physician -Make any legal decisions or sign important papers.  Meals: Start with liquid foods such as gelatin or soup. Progress to regular foods as tolerated. Avoid greasy, spicy, heavy foods. If nausea and/or vomiting occur, drink only clear liquids until the nausea and/or vomiting  subsides. Call your physician if vomiting continues.  Special Instructions/Symptoms: Your throat may feel dry or sore from the anesthesia or the breathing tube placed in your throat during surgery. If this causes discomfort, gargle with warm salt water. The discomfort should disappear within 24 hours.  If you had a scopolamine patch placed behind your ear for the management of post- operative nausea and/or vomiting:  1. The medication in the patch is effective for 72 hours, after which it should be removed.  Wrap patch in a tissue and discard in the trash. Wash hands thoroughly with soap and water. 2. You may remove the patch earlier than 72 hours if you experience unpleasant side effects which may include dry mouth, dizziness or  visual disturbances. 3. Avoid touching the patch. Wash your hands with soap and water after contact with the patch.

## 2017-07-05 NOTE — Anesthesia Postprocedure Evaluation (Signed)
Anesthesia Post Note  Patient: Kathalene FramesIona Iturralde  Procedure(s) Performed: LAPAROSCOPIC CHOLECYSTECTOMY (N/A Abdomen)     Patient location during evaluation: PACU Anesthesia Type: General Level of consciousness: awake and alert and oriented Pain management: pain level controlled Vital Signs Assessment: post-procedure vital signs reviewed and stable Respiratory status: spontaneous breathing, nonlabored ventilation and respiratory function stable Cardiovascular status: blood pressure returned to baseline and stable Postop Assessment: no apparent nausea or vomiting Anesthetic complications: no    Last Vitals:  Vitals:   07/05/17 0913 07/05/17 0928  BP: 108/71 (!) 105/53  Pulse: 77 82  Resp: 19 19  Temp:    SpO2: 100% 97%    Last Pain:  Vitals:   07/05/17 0915  TempSrc:   PainSc: 6                  Arlie Posch A.

## 2017-07-05 NOTE — Transfer of Care (Signed)
Immediate Anesthesia Transfer of Care Note  Patient: Jasmine FramesIona Huerta  Procedure(s) Performed: LAPAROSCOPIC CHOLECYSTECTOMY (N/A Abdomen)  Patient Location: PACU  Anesthesia Type:General  Level of Consciousness: awake, alert  and oriented  Airway & Oxygen Therapy: Patient Spontanous Breathing and Patient connected to nasal cannula oxygen  Post-op Assessment: Report given to RN, Post -op Vital signs reviewed and stable and Patient moving all extremities X 4  Post vital signs: Reviewed and stable  Last Vitals:  Vitals Value Taken Time  BP    Temp    Pulse 82 07/05/2017  8:57 AM  Resp 5 07/05/2017  8:57 AM  SpO2 100 % 07/05/2017  8:57 AM  Vitals shown include unvalidated device data.  Last Pain:  Vitals:   07/05/17 0554  TempSrc: Oral      Patients Stated Pain Goal: 3 (07/05/17 [id number])  Complications: No apparent anesthesia complications

## 2017-07-06 ENCOUNTER — Encounter (HOSPITAL_COMMUNITY): Payer: Self-pay | Admitting: Surgery

## 2018-05-20 ENCOUNTER — Encounter: Payer: Self-pay | Admitting: Family Medicine

## 2018-05-27 IMAGING — US US PELV - US TRANSVAGINAL
2 series · 13 of 25 positions shown · non-contrast
Comparison: None.

CLINICAL DATA: Initial evaluation for acute pelvic pain.

EXAM:
TRANSABDOMINAL AND TRANSVAGINAL ULTRASOUND OF PELVIS
DOPPLER ULTRASOUND OF OVARIES
TECHNIQUE: Both transabdominal and transvaginal ultrasound examinations of the
pelvis were performed. Transabdominal technique was performed for
global imaging of the pelvis including uterus, ovaries, adnexal
regions, and pelvic cul-de-sac.
It was necessary to proceed with endovaginal exam following the
transabdominal exam to visualize the endometrium, uterus, and
ovaries. Color and duplex Doppler ultrasound was utilized to
evaluate blood flow to the ovaries.

[Series 1: us pelv - us transvaginal · 12 of 67 slices shown (1 of 2)]
[im 1/67]
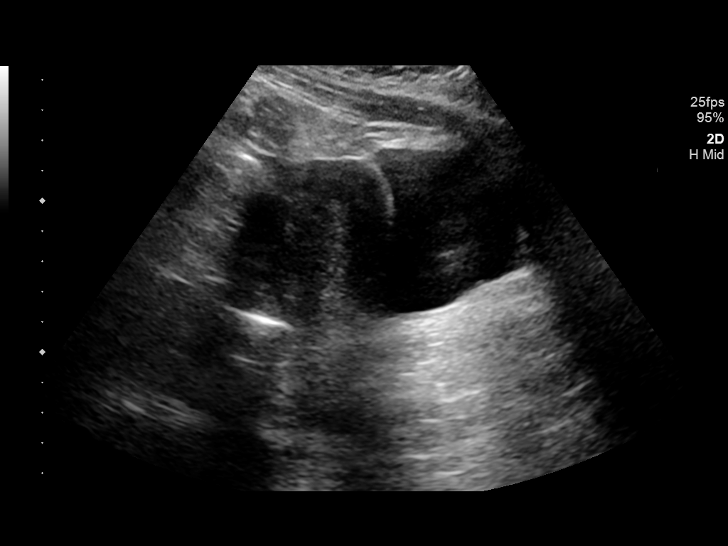
[im 6/67]
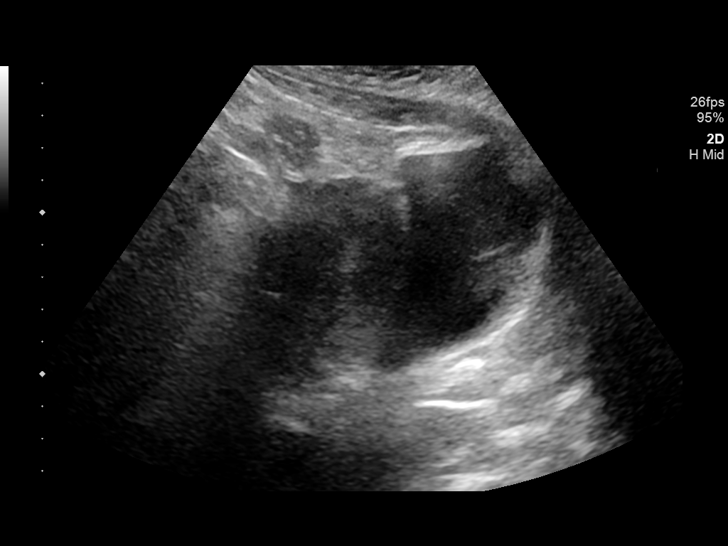
[im 12/67]
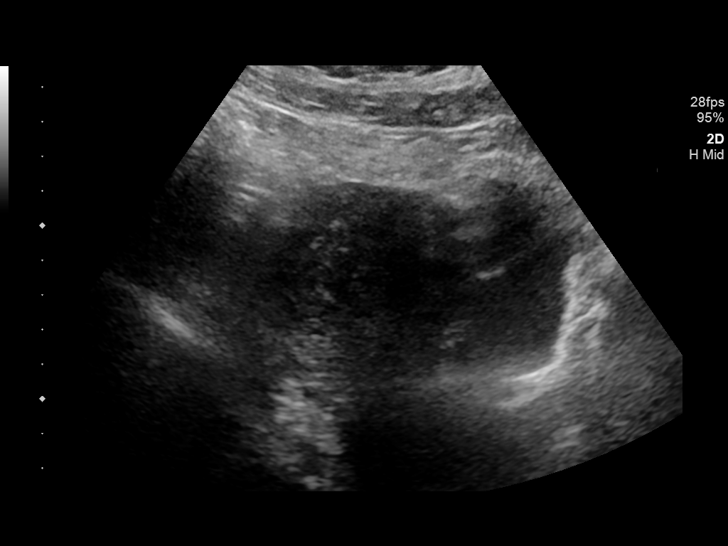
[im 18/67]
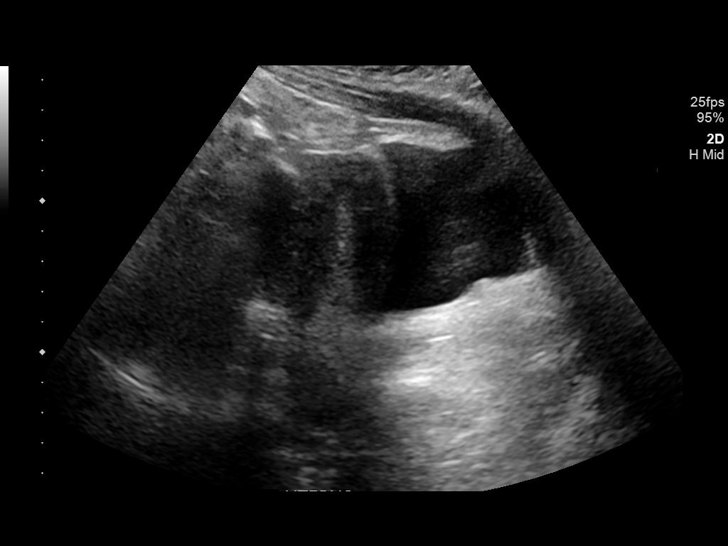
[im 23/67]
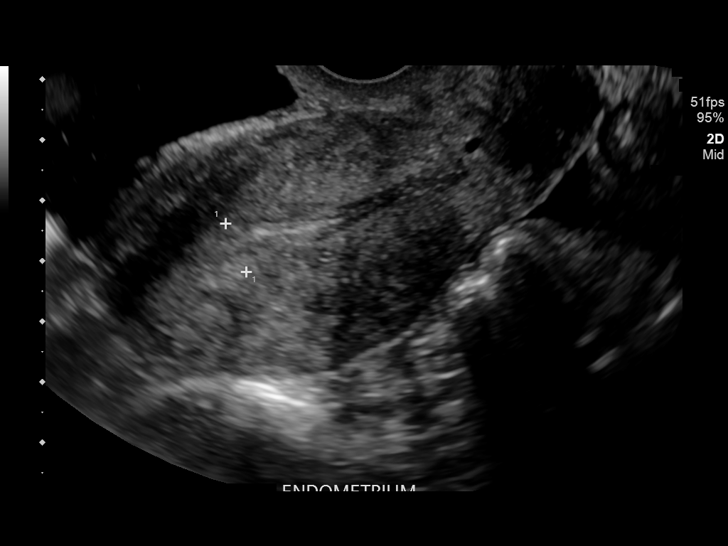
[im 29/67]
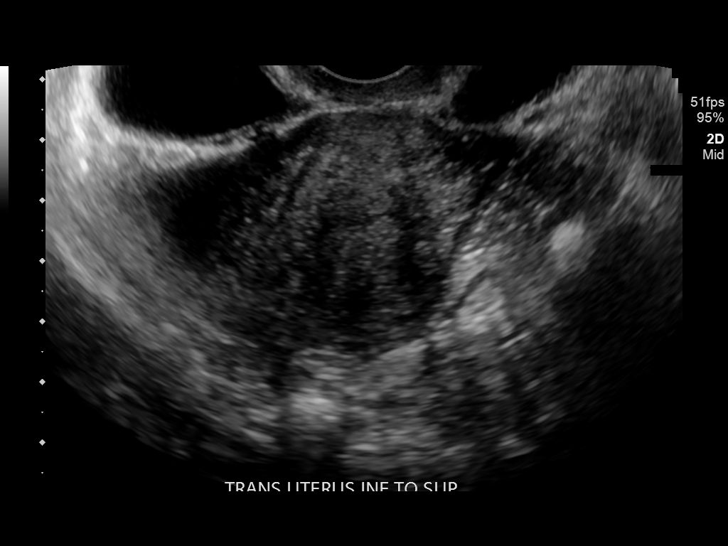
[im 35/67]
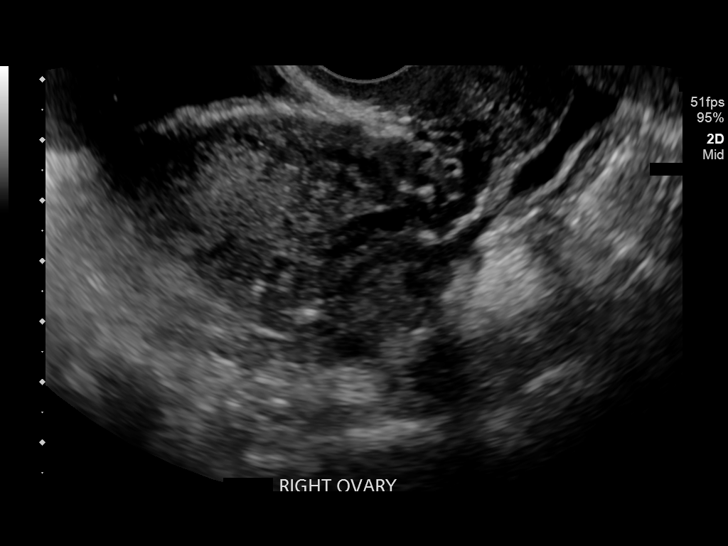
[im 41/67]
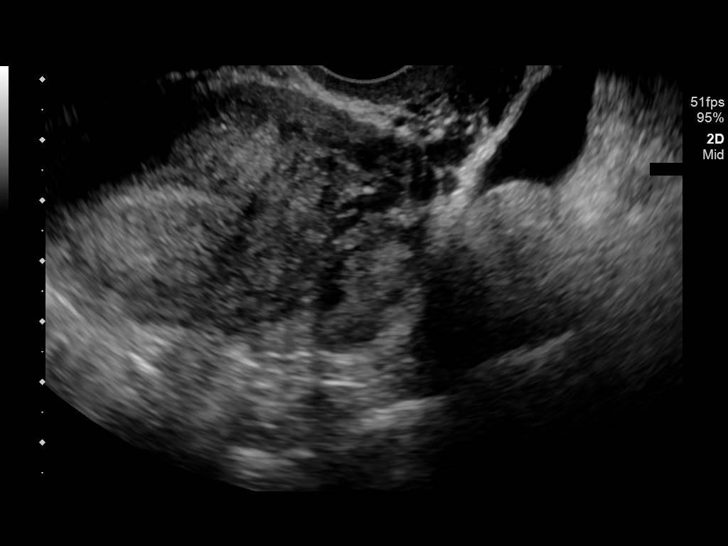
[im 46/67]
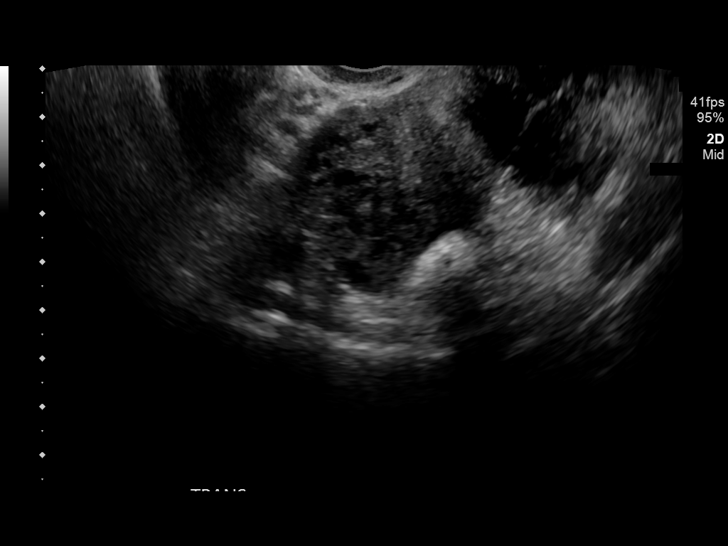
[im 52/67]
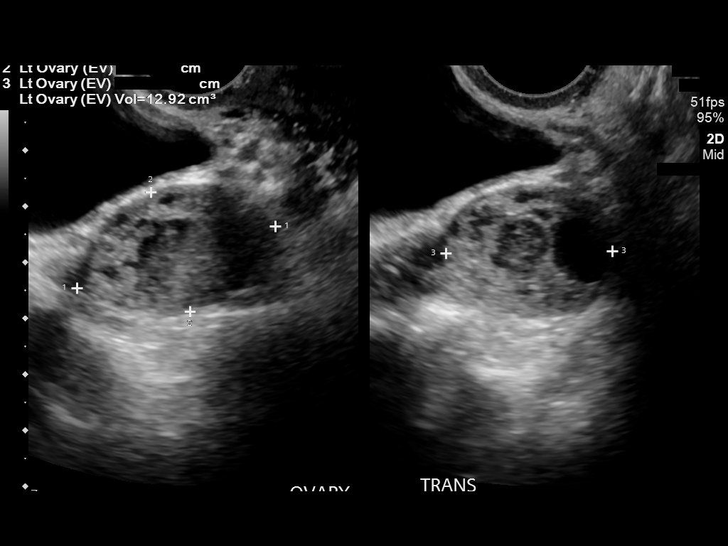
[im 58/67]
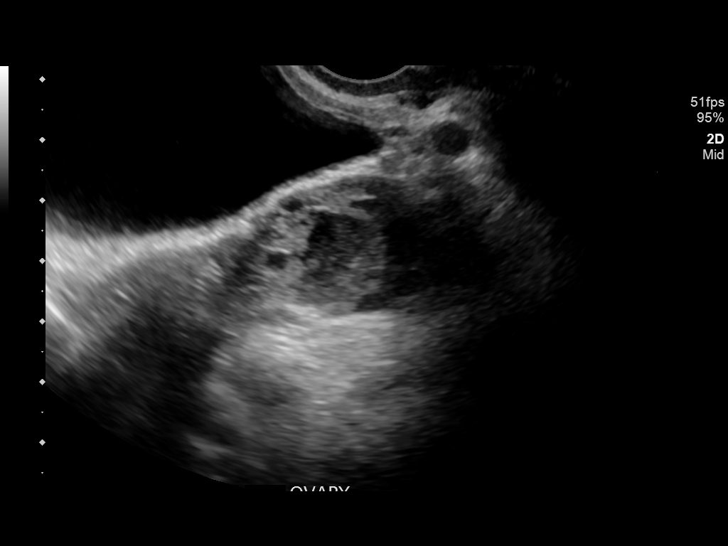
[im 64/67]
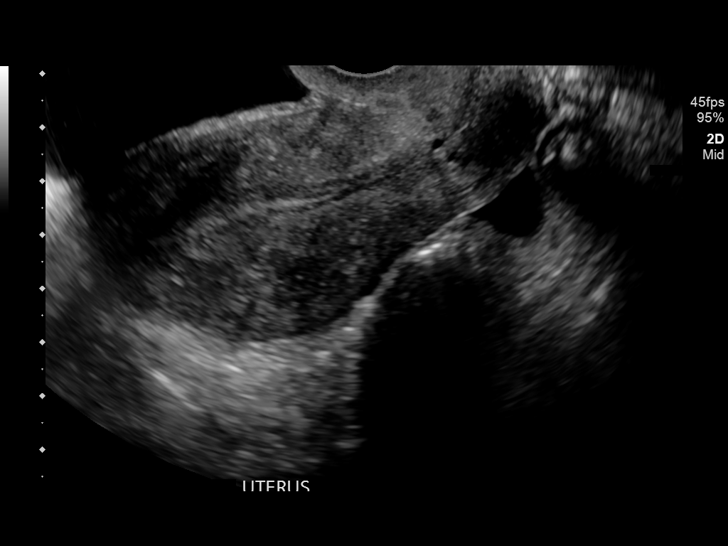

[Series 1003: us pelv - us transvaginal · 1 of 1 slices shown (2 of 2)]
[im 1/1]
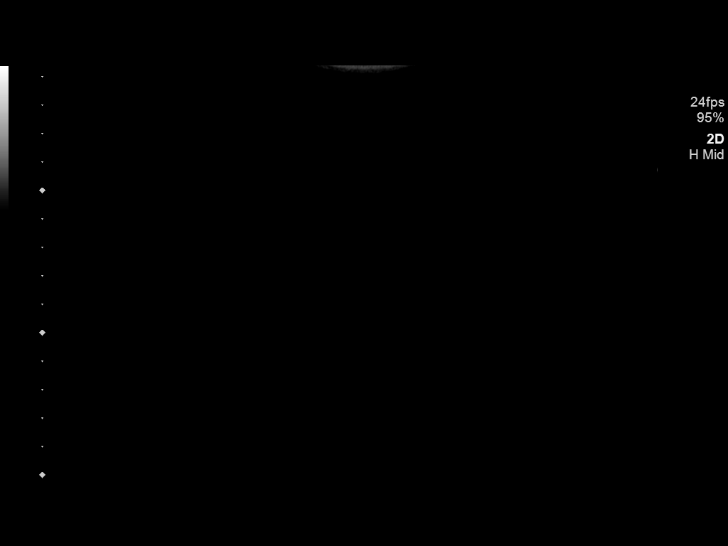

[13 of 25 positions shown; findings below may reference images not displayed]

FINDINGS: Uterus

Measurements: 8.3 x 4.1 x 4.4 cm. No fibroids or other mass
visualized.

Endometrium

Thickness: 8.7 mm.  No focal abnormality visualized.

Right ovary

Measurements: 3.0 x 1.5 x 1.7 cm. Normal appearance/no adnexal mass.

Left ovary

Measurements: 3.7 x 2.3 x 3.0 cm. 1.0 x 1.1 x 1.0 cm corpus luteal
cyst noted. An adjacent 1 cm dominant follicle/physiologic cyst
noted as well.

Pulsed Doppler evaluation of both ovaries demonstrates normal
low-resistance arterial and venous waveforms.

Other findings

Trace free physiologic fluid present within the pelvis.
IMPRESSION: 1. Approximate 1 cm left ovarian corpus luteal cyst with small
volume free physiologic fluid within the pelvis.
2. No other acute abnormality identified. No evidence for ovarian
torsion.

## 2018-06-07 ENCOUNTER — Encounter: Payer: Self-pay | Admitting: Registered"

## 2018-06-07 ENCOUNTER — Encounter: Payer: 59 | Attending: Family Medicine | Admitting: Registered"

## 2018-06-07 ENCOUNTER — Other Ambulatory Visit: Payer: Self-pay

## 2018-06-07 DIAGNOSIS — E119 Type 2 diabetes mellitus without complications: Secondary | ICD-10-CM | POA: Insufficient documentation

## 2018-06-07 NOTE — Progress Notes (Signed)
Diabetes Self-Management Education  Visit Type: First/Initial  Appt. Start Time: 1400 Appt. End Time: 1530  06/09/2018  [name], identified by name and date of birth, is a 33 y.o. female with a diagnosis of Diabetes: Type 2.   ASSESSMENT  There were no vitals taken for this visit. There is no height or weight on file to calculate BMI.   Pt c/o nocturnal polyuria, polydipsia, IBS, osteoarthritis - extreme pain with slightest touch, bruises easily, SOB  Per referral A1c 6.6%, prior to latest A1c, pre-diabetes added to problem list 2017.   Pt states concern about potential DM complications d/t experience with family members who have DM including mother whose kidneys are failing.  Pt reports low blood sugar symptoms mostly at work when she is limited how often she can eat. Pt states mostly happens when doesn't eat breakfast and symptoms at lunch ~1x/week now. Pt reports coffee makes her feel shakey & energy crash.   Pt reports sweet cravings evening and middle of night. Discussed learning how to decipher her body's language and advised to not ignore cravings and instead get curious.  Pt state she was having migraines 3-4x week, but last one was 1 week ago. Pt states she has tried migraine med but made her sick.  Exercise: Pt states she cannot exercise due to SOB. Pt was planning to continue seeking medical care to investigate/treat potential asthma.  Food: doesen't like eggs, oatmeal, milk, d/t texture. Switched to diet soda 2 yrs ago. Tries to limit rice, but she enjoys it and rice is a staple in her cultural way of eating. Trying to limit diet soda, when working was drinking 1 can/week, now working from home tries to limit 1 can/day.  Pt states her MD is not overly concerned about yer weight. Pt states she gained weight after having children and thinks she should lose weight.  Stress: additional stress d/t COVID, working 40 hrs from home, home schooling, maintaining  home.  Potential PCOS: GDM with both pregnancies 2010 & 2013, ovarian cysts, stated difficulty with memory, low energy (takes OTC iron), new issues with periods, for about 2 yrs has new symptoms with periods: breast swelling, cramping, heavier flow, intense pain first day, moody, irritable, food cravings.   Negative PCOS s/s include: no menstrual cycle issues prior, has 2 children with no difficulty conceiving. No signs of hirsutism.  RD recommended SMBG due to suspected higher BG than A1c would indicate due to FBG 166 mg/dL. Also told pt that if she gets a week worth of data FBG & PPBG before calling MD regarding potential metformin, that would help her provider made medication management decisions.  OneTouch Verio Flex [id number] x 11/02/2018 185 mg/dL ~54~90 min after meal of 2 spoonfuls of rice, spinach, fish  Diabetes Self-Management Education - 06/07/18 1624      Visit Information   Visit Type  First/Initial      Initial Visit   Diabetes Type  Type 2    Are you currently following a meal plan?  No    Are you taking your medications as prescribed?  Not on Medications    Date Diagnosed  05/24/2018      Health Coping   How would you rate your overall health?  Good      Psychosocial Assessment   Patient Belief/Attitude about Diabetes  Afraid    How often do you need to have someone help you when you read instructions, pamphlets, or other written materials from your doctor or  pharmacy?  1 - Never    What is the last grade level you completed in school?  12th grade      Complications   Last HgB A1C per patient/outside source  6.6 %    How often do you check your blood sugar?  0 times/day (not testing)    Have you had a dilated eye exam in the past 12 months?  Yes    Have you had a dental exam in the past 12 months?  Yes    Are you checking your feet?  No      Dietary Intake   Breakfast  most days skips OR eggs, sausage, waffle OR banana or yoplai whipped    Snack (morning)  soups     Lunch  rice, spinach, fish    Snack (afternoon)  pickle OR sandwich OR pasta    Dinner  rice, fried mushrooms,     Snack (evening)  none, sometimes craving some sweet in middle of night but not hungry    Beverage(s)  water (trying), diet soda, pineapple & coconut juice occassionally      Exercise   Exercise Type  ADL's    How many days per week to you exercise?  0    How many minutes per day do you exercise?  0    Total minutes per week of exercise  0      Patient Education   Previous Diabetes Education  Yes (please comment)   GDM 2010 & 2013   Nutrition management   Role of diet in the treatment of diabetes and the relationship between the three main macronutrients and blood glucose level    Physical activity and exercise   Role of exercise on diabetes management, blood pressure control and cardiac health.    Monitoring  Taught/evaluated SMBG meter.;Identified appropriate SMBG and/or A1C goals.    Psychosocial adjustment  Role of stress on diabetes      Individualized Goals (developed by patient)   Nutrition  General guidelines for healthy choices and portions discussed    Medications  Other (comment)   disucss metformin with MD   Monitoring   test my blood glucose as discussed      Outcomes   Expected Outcomes  Demonstrated interest in learning. Expect positive outcomes    Future DMSE  PRN    Program Status  Not Completed       Individualized Plan for Diabetes Self-Management Training:   Learning Objective:  Patient will have a greater understanding of diabetes self-management. Patient education plan is to attend individual and/or group sessions per assessed needs and concerns.    Patient Instructions  Consider checking blood sugar 3x week fasting and 3x 2 hrs after some meals.   Aim to eat balanced meals and snacks using MyPlate as a guide Because you are having low blood sugar symptoms, avoid skipping meals. Also check blood sugar when having symptoms and keep  notes.  Having breakfast more regularly may help reduce your sweet cravings.   Use the snack sheet for ideas for balanced snacks/small meals  Ask you doctor about looking into PCOS due to ovarian cysts, history of gestational diabetes, changes in your menstrual cycle symptoms, reduced energy.  Consider talking to your doctor about metformin because the ADA 2020 guidelines recommends women who have had gestational diabetes start metformin even at pre-diabetes range.     Expected Outcomes:  Demonstrated interest in learning. Expect positive outcomes  Education material provided: A1C conversion sheet,  My Plate and Snack sheet  If problems or questions, patient to contact team via:  Phone, MyChart  Future DSME appointment: PRN

## 2018-06-07 NOTE — Patient Instructions (Addendum)
Consider checking blood sugar 3x week fasting and 3x 2 hrs after some meals.   Aim to eat balanced meals and snacks using MyPlate as a guide Because you are having low blood sugar symptoms, avoid skipping meals. Also check blood sugar when having symptoms and keep notes.  Having breakfast more regularly may help reduce your sweet cravings.   Use the snack sheet for ideas for balanced snacks/small meals  Ask you doctor about looking into PCOS due to ovarian cysts, history of gestational diabetes, changes in your menstrual cycle symptoms, reduced energy.  Consider talking to your doctor about metformin because the ADA 2020 guidelines recommends women who have had gestational diabetes start metformin even at pre-diabetes range.

## 2020-06-12 LAB — HM PAP SMEAR

## 2021-03-27 ENCOUNTER — Ambulatory Visit
Admission: RE | Admit: 2021-03-27 | Discharge: 2021-03-27 | Disposition: A | Discharge status: Home / Self Care | Payer: No Typology Code available for payment source | Source: Ambulatory Visit | Attending: Sports Medicine | Admitting: Sports Medicine

## 2021-03-27 ENCOUNTER — Other Ambulatory Visit: Payer: Self-pay | Admitting: Sports Medicine

## 2021-03-27 DIAGNOSIS — M542 Cervicalgia: Secondary | ICD-10-CM

## 2021-03-27 DIAGNOSIS — M25511 Pain in right shoulder: Secondary | ICD-10-CM

## 2021-09-19 LAB — HEMOGLOBIN A1C: Hemoglobin A1C: 6.3

## 2021-10-16 ENCOUNTER — Encounter (HOSPITAL_BASED_OUTPATIENT_CLINIC_OR_DEPARTMENT_OTHER): Payer: Self-pay

## 2021-10-16 ENCOUNTER — Emergency Department (HOSPITAL_BASED_OUTPATIENT_CLINIC_OR_DEPARTMENT_OTHER): Payer: No Typology Code available for payment source

## 2021-10-16 ENCOUNTER — Other Ambulatory Visit: Payer: Self-pay

## 2021-10-16 ENCOUNTER — Emergency Department (HOSPITAL_BASED_OUTPATIENT_CLINIC_OR_DEPARTMENT_OTHER)
Admission: EM | Admit: 2021-10-16 | Discharge: 2021-10-16 | Disposition: A | Discharge status: Home / Self Care | Payer: No Typology Code available for payment source | Attending: Emergency Medicine | Admitting: Emergency Medicine

## 2021-10-16 ENCOUNTER — Emergency Department (HOSPITAL_BASED_OUTPATIENT_CLINIC_OR_DEPARTMENT_OTHER): Payer: No Typology Code available for payment source | Admitting: Radiology

## 2021-10-16 ENCOUNTER — Emergency Department (HOSPITAL_COMMUNITY)
Admission: EM | Admit: 2021-10-16 | Discharge: 2021-10-16 | Discharge status: Against Medical Advice | Payer: No Typology Code available for payment source | Attending: Emergency Medicine | Admitting: Emergency Medicine

## 2021-10-16 ENCOUNTER — Encounter (HOSPITAL_COMMUNITY): Payer: Self-pay | Admitting: Emergency Medicine

## 2021-10-16 DIAGNOSIS — M25511 Pain in right shoulder: Secondary | ICD-10-CM | POA: Diagnosis not present

## 2021-10-16 DIAGNOSIS — R079 Chest pain, unspecified: Secondary | ICD-10-CM | POA: Diagnosis present

## 2021-10-16 DIAGNOSIS — M25561 Pain in right knee: Secondary | ICD-10-CM | POA: Diagnosis not present

## 2021-10-16 DIAGNOSIS — Y92410 Unspecified street and highway as the place of occurrence of the external cause: Secondary | ICD-10-CM | POA: Diagnosis not present

## 2021-10-16 DIAGNOSIS — M25562 Pain in left knee: Secondary | ICD-10-CM | POA: Insufficient documentation

## 2021-10-16 DIAGNOSIS — S298XXA Other specified injuries of thorax, initial encounter: Secondary | ICD-10-CM

## 2021-10-16 DIAGNOSIS — S0990XA Unspecified injury of head, initial encounter: Secondary | ICD-10-CM | POA: Insufficient documentation

## 2021-10-16 DIAGNOSIS — Z5321 Procedure and treatment not carried out due to patient leaving prior to being seen by health care provider: Secondary | ICD-10-CM | POA: Diagnosis not present

## 2021-10-16 DIAGNOSIS — M545 Low back pain, unspecified: Secondary | ICD-10-CM | POA: Insufficient documentation

## 2021-10-16 DIAGNOSIS — M542 Cervicalgia: Secondary | ICD-10-CM | POA: Insufficient documentation

## 2021-10-16 DIAGNOSIS — M7918 Myalgia, other site: Secondary | ICD-10-CM | POA: Insufficient documentation

## 2021-10-16 MED ORDER — METHOCARBAMOL 500 MG PO TABS: 500.0000 mg | ORAL_TABLET | Freq: Three times a day (TID) | ORAL | 0 refills | Status: DC | PRN
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
## 2021-10-16 NOTE — ED Notes (Signed)
Pt discharged home.A/Ox4, VSS, ambulatory upon dc, education provided and opportunity for questions given. Jillyn Hidden, RN
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
## 2021-11-03 ENCOUNTER — Other Ambulatory Visit (HOSPITAL_BASED_OUTPATIENT_CLINIC_OR_DEPARTMENT_OTHER): Payer: Self-pay

## 2021-11-03 MED ORDER — FREESTYLE LIBRE 3 SENSOR MISC
11 refills | Status: DC
  Filled 2021-11-03: qty 2, 28d supply, fill #0

## 2021-11-03 MED ORDER — OZEMPIC (1 MG/DOSE) 4 MG/3ML ~~LOC~~ SOPN
1.0000 mg | PEN_INJECTOR | SUBCUTANEOUS | 3 refills | Status: DC
  Filled 2021-11-03: qty 3, 28d supply, fill #0
  Filled 2021-11-26: qty 3, 28d supply, fill #1
  Filled 2021-12-26: qty 3, 28d supply, fill #2

## 2021-11-04 ENCOUNTER — Other Ambulatory Visit (HOSPITAL_BASED_OUTPATIENT_CLINIC_OR_DEPARTMENT_OTHER): Payer: Self-pay

## 2021-11-07 ENCOUNTER — Other Ambulatory Visit (HOSPITAL_BASED_OUTPATIENT_CLINIC_OR_DEPARTMENT_OTHER): Payer: Self-pay

## 2021-11-25 ENCOUNTER — Ambulatory Visit: Payer: 59 | Admitting: Internal Medicine

## 2021-11-26 ENCOUNTER — Other Ambulatory Visit (HOSPITAL_BASED_OUTPATIENT_CLINIC_OR_DEPARTMENT_OTHER): Payer: Self-pay

## 2021-12-02 ENCOUNTER — Ambulatory Visit: Payer: No Typology Code available for payment source | Admitting: Internal Medicine

## 2021-12-02 ENCOUNTER — Encounter: Payer: Self-pay | Admitting: Internal Medicine

## 2021-12-02 ENCOUNTER — Other Ambulatory Visit (HOSPITAL_BASED_OUTPATIENT_CLINIC_OR_DEPARTMENT_OTHER): Payer: Self-pay

## 2021-12-02 VITALS — BP 110/70 | HR 97 | Temp 97.9°F | Wt 147.3 lb

## 2021-12-02 DIAGNOSIS — E785 Hyperlipidemia, unspecified: Secondary | ICD-10-CM | POA: Diagnosis not present

## 2021-12-02 DIAGNOSIS — K219 Gastro-esophageal reflux disease without esophagitis: Secondary | ICD-10-CM | POA: Diagnosis not present

## 2021-12-02 DIAGNOSIS — F909 Attention-deficit hyperactivity disorder, unspecified type: Secondary | ICD-10-CM | POA: Diagnosis not present

## 2021-12-02 DIAGNOSIS — E1169 Type 2 diabetes mellitus with other specified complication: Secondary | ICD-10-CM

## 2021-12-02 DIAGNOSIS — K581 Irritable bowel syndrome with constipation: Secondary | ICD-10-CM | POA: Diagnosis not present

## 2021-12-02 LAB — CBC WITH DIFFERENTIAL/PLATELET
Basophils Absolute: 0 10*3/uL (ref 0.0–0.1)
Basophils Relative: 0.7 % (ref 0.0–3.0)
Eosinophils Absolute: 0.1 10*3/uL (ref 0.0–0.7)
Eosinophils Relative: 1.5 % (ref 0.0–5.0)
HCT: 39.7 % (ref 36.0–46.0)
Hemoglobin: 12.9 g/dL (ref 12.0–15.0)
Lymphocytes Relative: 39.2 % (ref 12.0–46.0)
Lymphs Abs: 2.5 10*3/uL (ref 0.7–4.0)
MCHC: 32.6 g/dL (ref 30.0–36.0)
MCV: 80.8 fl (ref 78.0–100.0)
Monocytes Absolute: 0.4 10*3/uL (ref 0.1–1.0)
Monocytes Relative: 6.8 % (ref 3.0–12.0)
Neutro Abs: 3.3 10*3/uL (ref 1.4–7.7)
Neutrophils Relative %: 51.8 % (ref 43.0–77.0)
Platelets: 207 10*3/uL (ref 150.0–400.0)
RBC: 4.91 Mil/uL (ref 3.87–5.11)
RDW: 14.7 % (ref 11.5–15.5)
WBC: 6.3 10*3/uL (ref 4.0–10.5)

## 2021-12-02 LAB — COMPREHENSIVE METABOLIC PANEL
ALT: 14 U/L (ref 0–35)
AST: 14 U/L (ref 0–37)
Albumin: 4.4 g/dL (ref 3.5–5.2)
Alkaline Phosphatase: 67 U/L (ref 39–117)
BUN: 10 mg/dL (ref 6–23)
CO2: 27 mEq/L (ref 19–32)
Calcium: 9.1 mg/dL (ref 8.4–10.5)
Chloride: 103 mEq/L (ref 96–112)
Creatinine, Ser: 0.73 mg/dL (ref 0.40–1.20)
GFR: 107.23 mL/min (ref >60.00)
Glucose, Bld: 101 mg/dL — ABNORMAL HIGH (ref 70–99)
Potassium: 3.8 mEq/L (ref 3.5–5.1)
Sodium: 137 mEq/L (ref 135–145)
Total Bilirubin: 0.5 mg/dL (ref 0.2–1.2)
Total Protein: 7.7 g/dL (ref 6.0–8.3)

## 2021-12-02 LAB — VITAMIN B12: Vitamin B-12: 750 pg/mL (ref 211–911)

## 2021-12-02 LAB — LIPID PANEL
Cholesterol: 212 mg/dL — ABNORMAL HIGH (ref 0–200)
HDL: 64.8 mg/dL (ref >39.00)
LDL Cholesterol: 134 mg/dL — ABNORMAL HIGH (ref 0–99)
NonHDL: 147.64
Total CHOL/HDL Ratio: 3
Triglycerides: 66 mg/dL (ref 0.0–149.0)
VLDL: 13.2 mg/dL (ref 0.0–40.0)

## 2021-12-02 LAB — VITAMIN D 25 HYDROXY (VIT D DEFICIENCY, FRACTURES): VITD: 19.06 ng/mL — ABNORMAL LOW (ref 30.00–100.00)

## 2021-12-02 LAB — MICROALBUMIN / CREATININE URINE RATIO
Creatinine,U: 54.7 mg/dL
Microalb Creat Ratio: 1.5 mg/g (ref 0.0–30.0)
Microalb, Ur: 0.8 mg/dL (ref 0.0–1.9)

## 2021-12-02 LAB — TSH: TSH: 0.67 u[IU]/mL (ref 0.35–5.50)

## 2021-12-02 MED ORDER — DICYCLOMINE HCL 20 MG PO TABS: 20.0000 mg | ORAL_TABLET | Freq: Three times a day (TID) | ORAL | 1 refills | Status: DC | PRN

## 2021-12-02 MED ORDER — PANTOPRAZOLE SODIUM 40 MG PO TBEC: 40.0000 mg | DELAYED_RELEASE_TABLET | Freq: Every day | ORAL | 1 refills | Status: DC

## 2021-12-02 NOTE — Progress Notes (Signed)
New Patient Office Visit     CC/Reason for Visit: Establish care, discuss chronic and acute medical concerns Previous PCP: Shirlean Mylar, MD Last Visit: Unknown  HPI: Jasmine Huerta is a 36 y.o. female who is coming in today for the above mentioned reasons. Past Medical History is significant for: Type 2 diabetes followed by endocrinology, hyperlipidemia not on medication, IBS, GERD, ADHD.  She has been feeling well.  For diabetes she is on Ozempic 1 mg.  She tells me her most recent A1c was 6.3 in August.  Blood pressure is well controlled.  She was diagnosed with ADHD when she was younger, she wants to have a formal evaluation for this again.  She is requesting a refill of Bentyl for her IBS and Protonix for her GERD.  She is married, she has 2 children ages 64 and 53, she works as a Psychologist, sport and exercise at [employer].  She does not smoke, she does not drink, she has no known drug allergies.  Past surgical history significant for tubal ligation and a cholecystectomy.  Her family history significant for mother with hypothyroidism, type 2 diabetes, stage IV chronic kidney disease, hypertension and hyperlipidemia.   Past Medical/Surgical History: Past Medical History:  Diagnosis Date   Anxiety    Arthritis    "My Dr said I do , I dont know."   Bipolar disorder (HCC) age 42   Depression    GDM (gestational diabetes mellitus)    history of GDM   GERD (gastroesophageal reflux disease)    Headache    migraines - 07/02/17-  none recently   Pre-diabetes     Past Surgical History:  Procedure Laterality Date   CHOLECYSTECTOMY N/A 07/05/2017   Procedure: LAPAROSCOPIC CHOLECYSTECTOMY;  Surgeon: Berna Bue, MD;  Location: [facility] OR;  Service: General;  Laterality: N/A;   TUBAL LIGATION  09/17/2011   Procedure: POST PARTUM TUBAL LIGATION;  Surgeon: Purcell Nails, MD;  Location: [facility] ORS;  Service: Gynecology;  Laterality: Bilateral;   WISDOM TOOTH EXTRACTION      Social History:  reports that she has  never smoked. She has never used smokeless tobacco. She reports that she does not drink alcohol and does not use drugs.  Allergies: No Known Allergies  Family History:  Family History  Problem Relation Age of Onset   Heart disease Sister        whole in heart at birth   Anemia Mother    Hypertension Mother    Diabetes Mother    Thyroid disease Mother    Hypertension Maternal Grandfather    Anemia Daughter    Anemia Sister    Asthma Sister    Diabetes Maternal Uncle    Diabetes Maternal Grandmother    Thyroid disease Cousin    Seizures Sister      Current Outpatient Medications:    cetirizine (ZYRTEC) 10 MG tablet, Take 10 mg by mouth daily as needed for allergies., Disp: , Rfl:    Continuous Blood Gluc Sensor (FREESTYLE LIBRE 3 SENSOR) MISC, Apply 1 sensor to the skin every 14 days., Disp: 2 each, Rfl: 11   pantoprazole (PROTONIX) 40 MG tablet, Take 1 tablet (40 mg total) by mouth daily., Disp: 90 tablet, Rfl: 1   Semaglutide, 1 MG/DOSE, (OZEMPIC, 1 MG/DOSE,) 4 MG/3ML SOPN, Inject 1 mg into the skin once a week., Disp: 3 mL, Rfl: 3   dicyclomine (BENTYL) 20 MG tablet, Take 1 tablet (20 mg total) by mouth 3 (three) times  daily as needed for spasms., Disp: 90 tablet, Rfl: 1  Review of Systems:  Constitutional: Denies fever, chills, diaphoresis, appetite change and fatigue.  HEENT: Denies photophobia, eye pain, redness, hearing loss, ear pain, congestion, sore throat, rhinorrhea, sneezing, mouth sores, trouble swallowing, neck pain, neck stiffness and tinnitus.   Respiratory: Denies SOB, DOE, cough, chest tightness,  and wheezing.   Cardiovascular: Denies chest pain, palpitations and leg swelling.  Gastrointestinal: Denies nausea, vomiting, abdominal pain, diarrhea, constipation, blood in stool and abdominal distention.  Genitourinary: Denies dysuria, urgency, frequency, hematuria, flank pain and difficulty urinating.  Endocrine: Denies: hot or cold intolerance, sweats, changes in  hair or nails, polyuria, polydipsia. Musculoskeletal: Denies myalgias, back pain, joint swelling, arthralgias and gait problem.  Skin: Denies pallor, rash and wound.  Neurological: Denies dizziness, seizures, syncope, weakness, light-headedness, numbness and headaches.  Hematological: Denies adenopathy. Easy bruising, personal or family bleeding history  Psychiatric/Behavioral: Denies suicidal ideation, mood changes, confusion, nervousness, sleep disturbance and agitation    Physical Exam: Vitals:   12/02/21 0941  BP: 110/70  Pulse: 97  Temp: 97.9 F (36.6 C)  TempSrc: Oral  SpO2: 98%  Weight: 147 lb 4.8 oz (66.8 kg)   Body mass index is 26.94 kg/m.  Constitutional: NAD, calm, comfortable Eyes: PERRL, lids and conjunctivae normal, wears corrective lenses ENMT: Mucous membranes are moist.  Respiratory: clear to auscultation bilaterally, no wheezing, no crackles. Normal respiratory effort. No accessory muscle use.  Cardiovascular: Regular rate and rhythm, no murmurs / rubs / gallops. No extremity edema.  Psychiatric: Normal judgment and insight. Alert and oriented x 3. Normal mood.    Impression and Plan:  Type 2 diabetes mellitus with other specified complication, without long-term current use of insulin ([place]) - Plan: CBC with Differential/Platelet, Comprehensive metabolic panel, Microalbumin / creatinine urine ratio  Irritable bowel syndrome with constipation - Plan: dicyclomine (BENTYL) 20 MG tablet  Hyperlipidemia associated with type 2 diabetes mellitus ([place]) - Plan: Lipid panel  Attention deficit hyperactivity disorder (ADHD), unspecified ADHD type - Plan: TSH, Vitamin B12, VITAMIN D 25 Hydroxy (Vit-D Deficiency, Fractures)  Gastroesophageal reflux disease, unspecified whether esophagitis present - Plan: pantoprazole (PROTONIX) 40 MG tablet  -With a recent A1c of 6.3 I would consider her diabetes very well controlled, she is on semaglutide 1 mg and follows with  endocrinology, check microalbumin. -Bentyl will be prescribed for her IBS. -Pantoprazole will be prescribed for her GERD. -For ADHD I have requested referral to [place] [facility] for formal evaluation. -Immunizations are up-to-date.  Time spent: 45 minutes reviewing chart, interviewing and examining patient and formulating plan of care.        Lelon Frohlich, MD [facility]

## 2021-12-03 ENCOUNTER — Encounter: Payer: Self-pay | Admitting: Internal Medicine

## 2021-12-03 ENCOUNTER — Other Ambulatory Visit: Payer: Self-pay | Admitting: Internal Medicine

## 2021-12-03 DIAGNOSIS — E1169 Type 2 diabetes mellitus with other specified complication: Secondary | ICD-10-CM | POA: Insufficient documentation

## 2021-12-03 DIAGNOSIS — E559 Vitamin D deficiency, unspecified: Secondary | ICD-10-CM

## 2021-12-03 MED ORDER — ATORVASTATIN CALCIUM 40 MG PO TABS: 40.0000 mg | ORAL_TABLET | Freq: Every day | ORAL | 1 refills | Status: DC

## 2021-12-03 MED ORDER — VITAMIN D (ERGOCALCIFEROL) 1.25 MG (50000 UNIT) PO CAPS: 50000.0000 [IU] | ORAL_CAPSULE | ORAL | 0 refills | Status: AC

## 2021-12-04 ENCOUNTER — Other Ambulatory Visit: Payer: Self-pay | Admitting: *Deleted

## 2021-12-04 DIAGNOSIS — E1169 Type 2 diabetes mellitus with other specified complication: Secondary | ICD-10-CM

## 2021-12-04 DIAGNOSIS — E559 Vitamin D deficiency, unspecified: Secondary | ICD-10-CM

## 2021-12-26 ENCOUNTER — Other Ambulatory Visit (HOSPITAL_BASED_OUTPATIENT_CLINIC_OR_DEPARTMENT_OTHER): Payer: Self-pay

## 2021-12-30 ENCOUNTER — Other Ambulatory Visit: Payer: Self-pay | Admitting: Internal Medicine

## 2021-12-30 DIAGNOSIS — K581 Irritable bowel syndrome with constipation: Secondary | ICD-10-CM

## 2022-01-09 ENCOUNTER — Other Ambulatory Visit (HOSPITAL_BASED_OUTPATIENT_CLINIC_OR_DEPARTMENT_OTHER): Payer: Self-pay

## 2022-02-22 ENCOUNTER — Other Ambulatory Visit: Payer: Self-pay | Admitting: Internal Medicine

## 2022-02-22 DIAGNOSIS — E559 Vitamin D deficiency, unspecified: Secondary | ICD-10-CM

## 2022-02-25 ENCOUNTER — Other Ambulatory Visit (HOSPITAL_BASED_OUTPATIENT_CLINIC_OR_DEPARTMENT_OTHER): Payer: Self-pay

## 2022-02-26 ENCOUNTER — Other Ambulatory Visit (HOSPITAL_BASED_OUTPATIENT_CLINIC_OR_DEPARTMENT_OTHER): Payer: Self-pay

## 2022-02-26 MED ORDER — OZEMPIC (1 MG/DOSE) 4 MG/3ML ~~LOC~~ SOPN
1.0000 mg | PEN_INJECTOR | SUBCUTANEOUS | 3 refills | Status: DC
  Filled 2022-02-26 – 2022-07-13 (×3): qty 3, 28d supply, fill #0
  Filled 2022-08-10 – 2022-08-12 (×2): qty 3, 28d supply, fill #1
  Filled 2022-09-07: qty 3, 28d supply, fill #2

## 2022-02-27 ENCOUNTER — Encounter (HOSPITAL_BASED_OUTPATIENT_CLINIC_OR_DEPARTMENT_OTHER): Payer: Self-pay

## 2022-02-27 ENCOUNTER — Other Ambulatory Visit (HOSPITAL_BASED_OUTPATIENT_CLINIC_OR_DEPARTMENT_OTHER): Payer: Self-pay

## 2022-03-04 ENCOUNTER — Other Ambulatory Visit (HOSPITAL_BASED_OUTPATIENT_CLINIC_OR_DEPARTMENT_OTHER): Payer: Self-pay

## 2022-03-05 ENCOUNTER — Other Ambulatory Visit (HOSPITAL_BASED_OUTPATIENT_CLINIC_OR_DEPARTMENT_OTHER): Payer: Self-pay

## 2022-03-06 ENCOUNTER — Other Ambulatory Visit (INDEPENDENT_AMBULATORY_CARE_PROVIDER_SITE_OTHER): Payer: BC Managed Care – PPO

## 2022-03-06 DIAGNOSIS — E1169 Type 2 diabetes mellitus with other specified complication: Secondary | ICD-10-CM | POA: Diagnosis not present

## 2022-03-06 DIAGNOSIS — E785 Hyperlipidemia, unspecified: Secondary | ICD-10-CM

## 2022-03-06 DIAGNOSIS — E559 Vitamin D deficiency, unspecified: Secondary | ICD-10-CM

## 2022-03-06 LAB — LIPID PANEL
Cholesterol: 178 mg/dL (ref 0–200)
HDL: 53.3 mg/dL (ref >39.00)
LDL Cholesterol: 114 mg/dL — ABNORMAL HIGH (ref 0–99)
NonHDL: 124.93
Total CHOL/HDL Ratio: 3
Triglycerides: 57 mg/dL (ref 0.0–149.0)
VLDL: 11.4 mg/dL (ref 0.0–40.0)

## 2022-03-06 LAB — VITAMIN D 25 HYDROXY (VIT D DEFICIENCY, FRACTURES): VITD: 39.49 ng/mL (ref 30.00–100.00)

## 2022-03-11 ENCOUNTER — Other Ambulatory Visit: Payer: Self-pay | Admitting: Internal Medicine

## 2022-03-11 ENCOUNTER — Encounter: Payer: Self-pay | Admitting: Internal Medicine

## 2022-03-11 DIAGNOSIS — E1169 Type 2 diabetes mellitus with other specified complication: Secondary | ICD-10-CM

## 2022-03-11 MED ORDER — ROSUVASTATIN CALCIUM 5 MG PO TABS: 5.0000 mg | ORAL_TABLET | Freq: Every day | ORAL | 1 refills | Status: DC

## 2022-03-11 MED ORDER — ATORVASTATIN CALCIUM 80 MG PO TABS: 80.0000 mg | ORAL_TABLET | Freq: Every day | ORAL | 1 refills | Status: DC

## 2022-03-20 ENCOUNTER — Encounter: Payer: Self-pay | Admitting: Internal Medicine

## 2022-03-20 ENCOUNTER — Telehealth: Payer: Self-pay | Admitting: Internal Medicine

## 2022-03-20 DIAGNOSIS — E785 Hyperlipidemia, unspecified: Secondary | ICD-10-CM | POA: Diagnosis not present

## 2022-03-20 DIAGNOSIS — E1169 Type 2 diabetes mellitus with other specified complication: Secondary | ICD-10-CM | POA: Diagnosis not present

## 2022-03-20 NOTE — Telephone Encounter (Signed)
Patient says she is taking 2 statins and wanting to know if this is alright for her. Pls advise

## 2022-03-23 NOTE — Telephone Encounter (Signed)
Left message on machine for patient to return our call 

## 2022-03-24 NOTE — Addendum Note (Signed)
Addended by: Lamarr Lulas on: 03/24/2022 04:47 PM   Modules accepted: Orders

## 2022-03-24 NOTE — Telephone Encounter (Signed)
Patient should be taking lipitor 80 mg.  Patient is aware.  Medication list updated.

## 2022-04-20 DIAGNOSIS — F331 Major depressive disorder, recurrent, moderate: Secondary | ICD-10-CM | POA: Diagnosis not present

## 2022-04-20 DIAGNOSIS — F411 Generalized anxiety disorder: Secondary | ICD-10-CM | POA: Diagnosis not present

## 2022-04-20 DIAGNOSIS — F4312 Post-traumatic stress disorder, chronic: Secondary | ICD-10-CM | POA: Diagnosis not present

## 2022-05-05 DIAGNOSIS — F4312 Post-traumatic stress disorder, chronic: Secondary | ICD-10-CM | POA: Diagnosis not present

## 2022-05-05 DIAGNOSIS — F411 Generalized anxiety disorder: Secondary | ICD-10-CM | POA: Diagnosis not present

## 2022-05-05 DIAGNOSIS — F331 Major depressive disorder, recurrent, moderate: Secondary | ICD-10-CM | POA: Diagnosis not present

## 2022-05-18 ENCOUNTER — Telehealth: Payer: Self-pay | Admitting: *Deleted

## 2022-05-18 ENCOUNTER — Encounter: Payer: Self-pay | Admitting: Internal Medicine

## 2022-05-18 ENCOUNTER — Ambulatory Visit (INDEPENDENT_AMBULATORY_CARE_PROVIDER_SITE_OTHER): Payer: BC Managed Care – PPO | Admitting: Internal Medicine

## 2022-05-18 VITALS — BP 110/70 | HR 68 | Temp 97.9°F | Ht 62.5 in | Wt 145.4 lb

## 2022-05-18 DIAGNOSIS — F331 Major depressive disorder, recurrent, moderate: Secondary | ICD-10-CM | POA: Diagnosis not present

## 2022-05-18 DIAGNOSIS — J302 Other seasonal allergic rhinitis: Secondary | ICD-10-CM

## 2022-05-18 DIAGNOSIS — F411 Generalized anxiety disorder: Secondary | ICD-10-CM | POA: Diagnosis not present

## 2022-05-18 DIAGNOSIS — Z0000 Encounter for general adult medical examination without abnormal findings: Secondary | ICD-10-CM | POA: Diagnosis not present

## 2022-05-18 DIAGNOSIS — E785 Hyperlipidemia, unspecified: Secondary | ICD-10-CM

## 2022-05-18 DIAGNOSIS — E559 Vitamin D deficiency, unspecified: Secondary | ICD-10-CM

## 2022-05-18 DIAGNOSIS — R519 Headache, unspecified: Secondary | ICD-10-CM | POA: Diagnosis not present

## 2022-05-18 DIAGNOSIS — E1169 Type 2 diabetes mellitus with other specified complication: Secondary | ICD-10-CM | POA: Diagnosis not present

## 2022-05-18 DIAGNOSIS — F4312 Post-traumatic stress disorder, chronic: Secondary | ICD-10-CM | POA: Diagnosis not present

## 2022-05-18 MED ORDER — CETIRIZINE HCL 10 MG PO TABS
10.0000 mg | ORAL_TABLET | Freq: Every day | ORAL | 1 refills | Status: DC | PRN
Start: 2022-05-18 — End: 2022-07-28
  Filled 2022-07-13: qty 90, 90d supply, fill #0

## 2022-05-18 NOTE — Telephone Encounter (Signed)
Prior Berkley Harvey has been started for Protonix Key: [id number]

## 2022-05-18 NOTE — Progress Notes (Signed)
Established Patient Office Visit     CC/Reason for Visit: Annual preventive exam and discuss seasonal allergies and headaches  HPI: Jasmine Huerta is a 37 y.o. female who is coming in today for the above mentioned reasons. Past Medical History is significant for: Type 2 diabetes, hyperlipidemia, GERD, IBS, ADHD.  She has been having some sinus headaches and increased seasonal allergies.  She is due for refill of cetirizine.   Past Medical/Surgical History: Past Medical History:  Diagnosis Date   Anxiety    Arthritis    "My Dr said I do , I dont know."   Bipolar disorder age 88   Depression    GDM (gestational diabetes mellitus)    history of GDM   GERD (gastroesophageal reflux disease)    Headache    migraines - 07/02/17-  none recently   Pre-diabetes     Past Surgical History:  Procedure Laterality Date   CHOLECYSTECTOMY N/A 07/05/2017   Procedure: LAPAROSCOPIC CHOLECYSTECTOMY;  Surgeon: Berna Bue, MD;  Location: [facility] OR;  Service: General;  Laterality: N/A;   TUBAL LIGATION  09/17/2011   Procedure: POST PARTUM TUBAL LIGATION;  Surgeon: Purcell Nails, MD;  Location: [facility];  Service: Gynecology;  Laterality: Bilateral;   WISDOM TOOTH EXTRACTION      Social History:  reports that she has never smoked. She has never used smokeless tobacco. She reports that she does not drink alcohol and does not use drugs.  Allergies: No Known Allergies  Family History:  Family History  Problem Relation Age of Onset   Heart disease Sister        whole in heart at birth   Anemia Mother    Hypertension Mother    Diabetes Mother    Thyroid disease Mother    Hypertension Maternal Grandfather    Anemia Daughter    Anemia Sister    Asthma Sister    Diabetes Maternal Uncle    Diabetes Maternal Grandmother    Thyroid disease Cousin    Seizures Sister      Current Outpatient Medications:    atorvastatin (LIPITOR) 80 MG tablet, Take 80 mg by mouth daily., Disp: , Rfl:     Continuous Blood Gluc Sensor (FREESTYLE LIBRE 3 SENSOR) MISC, Apply 1 sensor to the skin every 14 days., Disp: 2 each, Rfl: 11   dicyclomine (BENTYL) 20 MG tablet, TAKE 1 TABLET (20 MG TOTAL) BY MOUTH 3 (THREE) TIMES DAILY AS NEEDED FOR SPASMS., Disp: 270 tablet, Rfl: 1   metFORMIN (GLUCOPHAGE-XR) 500 MG 24 hr tablet, Take 500 mg by mouth daily with breakfast., Disp: , Rfl:    pantoprazole (PROTONIX) 40 MG tablet, Take 1 tablet (40 mg total) by mouth daily., Disp: 90 tablet, Rfl: 1   Semaglutide, 1 MG/DOSE, (OZEMPIC, 1 MG/DOSE,) 4 MG/3ML SOPN, Inject 1 mg into the skin once a week., Disp: 3 mL, Rfl: 3   sertraline (ZOLOFT) 25 MG tablet, Take 25 mg by mouth daily., Disp: , Rfl:    cetirizine (ZYRTEC) 10 MG tablet, Take 1 tablet (10 mg total) by mouth daily as needed for allergies., Disp: 90 tablet, Rfl: 1  Review of Systems:  Negative unless indicated in HPI.   Physical Exam: Vitals:   05/18/22 0808  BP: 110/70  Pulse: 68  Temp: 97.9 F (36.6 C)  TempSrc: Oral  SpO2: 98%  Weight: 145 lb 6.4 oz (66 kg)  Height: 5' 2.5" (1.588 m)    Body mass index is 26.17 kg/m.  Physical Exam Vitals reviewed.  Constitutional:      General: She is not in acute distress.    Appearance: Normal appearance. She is not ill-appearing, toxic-appearing or diaphoretic.  HENT:     Head: Normocephalic.     Right Ear: Tympanic membrane, ear canal and external ear normal. There is no impacted cerumen.     Left Ear: Tympanic membrane, ear canal and external ear normal. There is no impacted cerumen.     Nose: Nose normal.     Mouth/Throat:     Mouth: Mucous membranes are moist.     Pharynx: Oropharynx is clear. No oropharyngeal exudate or posterior oropharyngeal erythema.  Eyes:     General: No scleral icterus.       Right eye: No discharge.        Left eye: No discharge.     Conjunctiva/sclera: Conjunctivae normal.     Pupils: Pupils are equal, round, and reactive to light.  Neck:     Vascular: No  carotid bruit.  Cardiovascular:     Rate and Rhythm: Normal rate and regular rhythm.     Pulses: Normal pulses.     Heart sounds: Normal heart sounds.  Pulmonary:     Effort: Pulmonary effort is normal. No respiratory distress.     Breath sounds: Normal breath sounds.  Abdominal:     General: Abdomen is flat. Bowel sounds are normal.     Palpations: Abdomen is soft.  Musculoskeletal:        General: Normal range of motion.     Cervical back: Normal range of motion.  Skin:    General: Skin is warm and dry.  Neurological:     General: No focal deficit present.     Mental Status: She is alert and oriented to person, place, and time. Mental status is at baseline.  Psychiatric:        Mood and Affect: Mood normal.        Behavior: Behavior normal.        Thought Content: Thought content normal.        Judgment: Judgment normal.      Impression and Plan:  Encounter for preventive health examination  Hyperlipidemia associated with type 2 diabetes mellitus - Plan: Lipid panel, Lipid panel  Vitamin D deficiency - Plan: Vitamin D, 25-hydroxy, Vitamin D, 25-hydroxy  Type 2 diabetes mellitus with other specified complication, without long-term current use of insulin - Plan: CBC with Differential/Platelet, Comprehensive metabolic panel, TSH, Vitamin B12, Hemoglobin A1c, Hemoglobin A1c, Vitamin B12, TSH, Comprehensive metabolic panel, CBC with Differential/Platelet  Seasonal allergies - Plan: cetirizine (ZYRTEC) 10 MG tablet  Sinus headache - Plan: cetirizine (ZYRTEC) 10 MG tablet  -Recommend routine eye and dental care. -Healthy lifestyle discussed in detail. -Labs to be updated today. -Prostate cancer screening: N/A Health Maintenance  Topic Date Due   Complete foot exam   Never done   Hepatitis C Screening: USPSTF Recommendation to screen - Ages 23-79 yo.  Never done   Hemoglobin A1C  03/22/2022   COVID-19 Vaccine (1) 06/03/2022*   Flu Shot  09/03/2022   Yearly kidney  function blood test for diabetes  12/03/2022   Yearly kidney health urinalysis for diabetes  12/03/2022   Eye exam for diabetics  04/17/2023   Pap Smear  02/03/2024   DTaP/Tdap/Td vaccine (3 - Td or Tdap) 12/20/2030   HIV Screening  Completed   HPV Vaccine  Aged Out  *Topic was postponed. The date shown is not  the original due date.   -For her seasonal allergies and sinus headache have advised a combination of a daily antihistamine like cetirizine plus twice daily Mucinex.       Chaya Jan, MD  Primary Care at [facility]

## 2022-05-21 NOTE — Telephone Encounter (Signed)
More information sent.

## 2022-05-28 ENCOUNTER — Emergency Department (HOSPITAL_BASED_OUTPATIENT_CLINIC_OR_DEPARTMENT_OTHER): Payer: BC Managed Care – PPO | Admitting: Radiology

## 2022-05-28 ENCOUNTER — Encounter (HOSPITAL_BASED_OUTPATIENT_CLINIC_OR_DEPARTMENT_OTHER): Payer: Self-pay | Admitting: *Deleted

## 2022-05-28 ENCOUNTER — Other Ambulatory Visit: Payer: Self-pay

## 2022-05-28 ENCOUNTER — Emergency Department (HOSPITAL_BASED_OUTPATIENT_CLINIC_OR_DEPARTMENT_OTHER)
Admission: EM | Admit: 2022-05-28 | Discharge: 2022-05-28 | Disposition: A | Discharge status: Home / Self Care | Payer: PRIVATE HEALTH INSURANCE | Attending: Emergency Medicine | Admitting: Emergency Medicine

## 2022-05-28 DIAGNOSIS — M545 Low back pain, unspecified: Secondary | ICD-10-CM | POA: Insufficient documentation

## 2022-05-28 DIAGNOSIS — M549 Dorsalgia, unspecified: Secondary | ICD-10-CM | POA: Diagnosis present

## 2022-05-28 LAB — PREGNANCY, URINE: Preg Test, Ur: NEGATIVE

## 2022-05-28 MED ORDER — NAPROXEN 375 MG PO TABS: 375.0000 mg | ORAL_TABLET | Freq: Two times a day (BID) | ORAL | 0 refills | Status: AC

## 2022-05-28 MED ORDER — METHOCARBAMOL 500 MG PO TABS: 500.0000 mg | ORAL_TABLET | Freq: Two times a day (BID) | ORAL | 0 refills | Status: AC
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
## 2022-05-28 NOTE — Discharge Instructions (Addendum)
The xray of your lumbar and thoracic spine were negative today.  I have given you a short course of muscle relaxers to help with your symptoms of likely muscle strain.  You were also given a short course of anti-inflammatories, please take this as directed.  You may also apply heat or ice to the area to help with symptoms.

## 2022-05-28 NOTE — Telephone Encounter (Signed)
This request has been approved. °
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
## 2022-06-01 ENCOUNTER — Other Ambulatory Visit (HOSPITAL_BASED_OUTPATIENT_CLINIC_OR_DEPARTMENT_OTHER): Payer: Self-pay

## 2022-06-01 MED ORDER — METHOCARBAMOL 500 MG PO TABS
500.0000 mg | ORAL_TABLET | Freq: Three times a day (TID) | ORAL | 0 refills | Status: DC | PRN
  Filled 2022-06-01 – 2022-06-15 (×2): qty 50, 9d supply, fill #0

## 2022-06-02 ENCOUNTER — Other Ambulatory Visit (HOSPITAL_BASED_OUTPATIENT_CLINIC_OR_DEPARTMENT_OTHER): Payer: Self-pay

## 2022-06-02 ENCOUNTER — Ambulatory Visit: Payer: No Typology Code available for payment source | Admitting: Internal Medicine

## 2022-06-02 DIAGNOSIS — F900 Attention-deficit hyperactivity disorder, predominantly inattentive type: Secondary | ICD-10-CM | POA: Diagnosis not present

## 2022-06-02 DIAGNOSIS — F331 Major depressive disorder, recurrent, moderate: Secondary | ICD-10-CM | POA: Diagnosis not present

## 2022-06-02 DIAGNOSIS — F411 Generalized anxiety disorder: Secondary | ICD-10-CM | POA: Diagnosis not present

## 2022-06-02 MED ORDER — SERTRALINE HCL 50 MG PO TABS
50.0000 mg | ORAL_TABLET | Freq: Every day | ORAL | 0 refills | Status: DC
  Filled 2022-06-02 – 2022-06-15 (×2): qty 30, 30d supply, fill #0

## 2022-06-02 MED ORDER — AMPHETAMINE-DEXTROAMPHET ER 5 MG PO CP24
5.0000 mg | ORAL_CAPSULE | Freq: Every morning | ORAL | 0 refills | Status: DC
  Filled 2022-06-15: qty 30, 30d supply, fill #0

## 2022-06-08 ENCOUNTER — Other Ambulatory Visit (HOSPITAL_BASED_OUTPATIENT_CLINIC_OR_DEPARTMENT_OTHER): Payer: Self-pay

## 2022-06-09 DIAGNOSIS — F900 Attention-deficit hyperactivity disorder, predominantly inattentive type: Secondary | ICD-10-CM | POA: Diagnosis not present

## 2022-06-15 ENCOUNTER — Other Ambulatory Visit: Payer: Self-pay

## 2022-06-15 ENCOUNTER — Other Ambulatory Visit (HOSPITAL_BASED_OUTPATIENT_CLINIC_OR_DEPARTMENT_OTHER): Payer: Self-pay

## 2022-06-15 MED ORDER — FREESTYLE LIBRE 3 SENSOR MISC
1.0000 | 11 refills | Status: AC
  Filled 2022-06-15 – 2022-06-20 (×3): qty 2, 28d supply, fill #0
  Filled 2022-07-13 – 2022-07-28 (×2): qty 2, 28d supply, fill #1
  Filled 2023-05-01: qty 2, 28d supply, fill #2

## 2022-06-15 MED ORDER — OZEMPIC (1 MG/DOSE) 4 MG/3ML ~~LOC~~ SOPN
1.0000 mg | PEN_INJECTOR | SUBCUTANEOUS | 3 refills | Status: DC
  Filled 2022-06-15: qty 3, 28d supply, fill #0
  Filled 2022-07-28: qty 3, 28d supply, fill #1

## 2022-06-16 ENCOUNTER — Other Ambulatory Visit (INDEPENDENT_AMBULATORY_CARE_PROVIDER_SITE_OTHER): Payer: BC Managed Care – PPO

## 2022-06-16 ENCOUNTER — Ambulatory Visit: Payer: BC Managed Care – PPO | Admitting: Internal Medicine

## 2022-06-16 ENCOUNTER — Other Ambulatory Visit (HOSPITAL_BASED_OUTPATIENT_CLINIC_OR_DEPARTMENT_OTHER): Payer: Self-pay

## 2022-06-16 DIAGNOSIS — Z7984 Long term (current) use of oral hypoglycemic drugs: Secondary | ICD-10-CM

## 2022-06-16 DIAGNOSIS — E1169 Type 2 diabetes mellitus with other specified complication: Secondary | ICD-10-CM | POA: Diagnosis not present

## 2022-06-16 DIAGNOSIS — E785 Hyperlipidemia, unspecified: Secondary | ICD-10-CM

## 2022-06-16 DIAGNOSIS — E559 Vitamin D deficiency, unspecified: Secondary | ICD-10-CM

## 2022-06-16 LAB — COMPREHENSIVE METABOLIC PANEL
ALT: 15 U/L (ref 0–35)
AST: 15 U/L (ref 0–37)
Albumin: 3.9 g/dL (ref 3.5–5.2)
Alkaline Phosphatase: 77 U/L (ref 39–117)
BUN: 10 mg/dL (ref 6–23)
CO2: 27 mEq/L (ref 19–32)
Calcium: 8.5 mg/dL (ref 8.4–10.5)
Chloride: 101 mEq/L (ref 96–112)
Creatinine, Ser: 0.78 mg/dL (ref 0.40–1.20)
GFR: 98.66 mL/min (ref >60.00)
Glucose, Bld: 115 mg/dL — ABNORMAL HIGH (ref 70–99)
Potassium: 3.9 mEq/L (ref 3.5–5.1)
Sodium: 136 mEq/L (ref 135–145)
Total Bilirubin: 0.4 mg/dL (ref 0.2–1.2)
Total Protein: 6.8 g/dL (ref 6.0–8.3)

## 2022-06-16 LAB — LIPID PANEL
Cholesterol: 115 mg/dL (ref 0–200)
HDL: 49.5 mg/dL (ref >39.00)
LDL Cholesterol: 50 mg/dL (ref 0–99)
NonHDL: 65.59
Total CHOL/HDL Ratio: 2
Triglycerides: 77 mg/dL (ref 0.0–149.0)
VLDL: 15.4 mg/dL (ref 0.0–40.0)

## 2022-06-16 LAB — CBC WITH DIFFERENTIAL/PLATELET
Basophils Absolute: 0 10*3/uL (ref 0.0–0.1)
Basophils Relative: 0.5 % (ref 0.0–3.0)
Eosinophils Absolute: 0.1 10*3/uL (ref 0.0–0.7)
Eosinophils Relative: 1.4 % (ref 0.0–5.0)
HCT: 37.7 % (ref 36.0–46.0)
Hemoglobin: 12.4 g/dL (ref 12.0–15.0)
Lymphocytes Relative: 38.9 % (ref 12.0–46.0)
Lymphs Abs: 2.8 10*3/uL (ref 0.7–4.0)
MCHC: 32.8 g/dL (ref 30.0–36.0)
MCV: 80.3 fl (ref 78.0–100.0)
Monocytes Absolute: 0.6 10*3/uL (ref 0.1–1.0)
Monocytes Relative: 8.2 % (ref 3.0–12.0)
Neutro Abs: 3.7 10*3/uL (ref 1.4–7.7)
Neutrophils Relative %: 51 % (ref 43.0–77.0)
Platelets: 170 10*3/uL (ref 150.0–400.0)
RBC: 4.7 Mil/uL (ref 3.87–5.11)
RDW: 14.7 % (ref 11.5–15.5)
WBC: 7.3 10*3/uL (ref 4.0–10.5)

## 2022-06-16 LAB — VITAMIN D 25 HYDROXY (VIT D DEFICIENCY, FRACTURES): VITD: 24.34 ng/mL — ABNORMAL LOW (ref 30.00–100.00)

## 2022-06-16 LAB — TSH: TSH: 1.2 u[IU]/mL (ref 0.35–5.50)

## 2022-06-16 LAB — VITAMIN B12: Vitamin B-12: 514 pg/mL (ref 211–911)

## 2022-06-16 LAB — HEMOGLOBIN A1C: Hgb A1c MFr Bld: 6.3 % (ref 4.6–6.5)

## 2022-06-20 ENCOUNTER — Other Ambulatory Visit (HOSPITAL_BASED_OUTPATIENT_CLINIC_OR_DEPARTMENT_OTHER): Payer: Self-pay

## 2022-06-22 ENCOUNTER — Encounter (HOSPITAL_BASED_OUTPATIENT_CLINIC_OR_DEPARTMENT_OTHER): Payer: Self-pay

## 2022-06-22 ENCOUNTER — Other Ambulatory Visit (HOSPITAL_BASED_OUTPATIENT_CLINIC_OR_DEPARTMENT_OTHER): Payer: Self-pay

## 2022-06-23 ENCOUNTER — Encounter: Payer: Self-pay | Admitting: Internal Medicine

## 2022-06-23 ENCOUNTER — Other Ambulatory Visit: Payer: Self-pay | Admitting: Internal Medicine

## 2022-06-23 ENCOUNTER — Other Ambulatory Visit (HOSPITAL_BASED_OUTPATIENT_CLINIC_OR_DEPARTMENT_OTHER): Payer: Self-pay

## 2022-06-23 DIAGNOSIS — E559 Vitamin D deficiency, unspecified: Secondary | ICD-10-CM

## 2022-06-23 DIAGNOSIS — Z9851 Tubal ligation status: Secondary | ICD-10-CM | POA: Diagnosis not present

## 2022-06-23 DIAGNOSIS — Z01419 Encounter for gynecological examination (general) (routine) without abnormal findings: Secondary | ICD-10-CM | POA: Diagnosis not present

## 2022-06-23 MED ORDER — ATORVASTATIN CALCIUM 80 MG PO TABS
80.0000 mg | ORAL_TABLET | Freq: Every day | ORAL | 1 refills | Status: DC
  Filled 2022-07-13: qty 90, 90d supply, fill #0

## 2022-06-23 MED ORDER — METFORMIN HCL ER 500 MG PO TB24
500.0000 mg | ORAL_TABLET | Freq: Every day | ORAL | 1 refills | Status: DC
  Filled 2022-07-13: qty 90, 90d supply, fill #0

## 2022-06-23 MED ORDER — ROSUVASTATIN CALCIUM 5 MG PO TABS: 5.0000 mg | ORAL_TABLET | Freq: Every day | ORAL | 1 refills | Status: DC

## 2022-06-23 MED ORDER — VITAMIN D (ERGOCALCIFEROL) 1.25 MG (50000 UNIT) PO CAPS
50000.0000 [IU] | ORAL_CAPSULE | ORAL | 0 refills | Status: AC
Start: 2022-06-23 — End: 2022-09-22
  Filled 2022-06-23: qty 12, 84d supply, fill #0

## 2022-06-26 ENCOUNTER — Other Ambulatory Visit (HOSPITAL_BASED_OUTPATIENT_CLINIC_OR_DEPARTMENT_OTHER): Payer: Self-pay

## 2022-06-30 ENCOUNTER — Other Ambulatory Visit (HOSPITAL_BASED_OUTPATIENT_CLINIC_OR_DEPARTMENT_OTHER): Payer: Self-pay

## 2022-06-30 DIAGNOSIS — F4312 Post-traumatic stress disorder, chronic: Secondary | ICD-10-CM | POA: Diagnosis not present

## 2022-06-30 DIAGNOSIS — F900 Attention-deficit hyperactivity disorder, predominantly inattentive type: Secondary | ICD-10-CM | POA: Diagnosis not present

## 2022-06-30 DIAGNOSIS — F411 Generalized anxiety disorder: Secondary | ICD-10-CM | POA: Diagnosis not present

## 2022-06-30 DIAGNOSIS — F331 Major depressive disorder, recurrent, moderate: Secondary | ICD-10-CM | POA: Diagnosis not present

## 2022-06-30 MED ORDER — SERTRALINE HCL 50 MG PO TABS
75.0000 mg | ORAL_TABLET | Freq: Every day | ORAL | 0 refills | Status: DC
  Filled 2022-06-30: qty 45, 30d supply, fill #0

## 2022-06-30 MED ORDER — AMPHETAMINE-DEXTROAMPHET ER 10 MG PO CP24
10.0000 mg | ORAL_CAPSULE | Freq: Every morning | ORAL | 0 refills | Status: DC
  Filled 2022-06-30: qty 30, 30d supply, fill #0
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
## 2022-07-01 ENCOUNTER — Ambulatory Visit: Payer: PRIVATE HEALTH INSURANCE | Attending: Family Medicine | Admitting: Physical Therapy

## 2022-07-01 ENCOUNTER — Encounter: Payer: Self-pay | Admitting: Physical Therapy

## 2022-07-01 ENCOUNTER — Other Ambulatory Visit: Payer: Self-pay

## 2022-07-01 DIAGNOSIS — M6281 Muscle weakness (generalized): Secondary | ICD-10-CM | POA: Diagnosis present

## 2022-07-01 DIAGNOSIS — M5459 Other low back pain: Secondary | ICD-10-CM | POA: Diagnosis present

## 2022-07-01 DIAGNOSIS — M546 Pain in thoracic spine: Secondary | ICD-10-CM | POA: Diagnosis present

## 2022-07-09 ENCOUNTER — Encounter: Payer: Self-pay | Admitting: Physical Therapy

## 2022-07-09 ENCOUNTER — Ambulatory Visit: Payer: PRIVATE HEALTH INSURANCE | Attending: Family Medicine | Admitting: Physical Therapy

## 2022-07-09 DIAGNOSIS — M5459 Other low back pain: Secondary | ICD-10-CM | POA: Diagnosis not present

## 2022-07-09 DIAGNOSIS — M546 Pain in thoracic spine: Secondary | ICD-10-CM | POA: Diagnosis present

## 2022-07-09 DIAGNOSIS — M6281 Muscle weakness (generalized): Secondary | ICD-10-CM | POA: Diagnosis present
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
## 2022-07-13 ENCOUNTER — Other Ambulatory Visit (HOSPITAL_BASED_OUTPATIENT_CLINIC_OR_DEPARTMENT_OTHER): Payer: Self-pay

## 2022-07-13 ENCOUNTER — Other Ambulatory Visit: Payer: Self-pay

## 2022-07-16 ENCOUNTER — Encounter: Payer: Self-pay | Admitting: Physical Therapy

## 2022-07-16 ENCOUNTER — Ambulatory Visit: Payer: PRIVATE HEALTH INSURANCE | Admitting: Physical Therapy

## 2022-07-16 DIAGNOSIS — M6281 Muscle weakness (generalized): Secondary | ICD-10-CM

## 2022-07-16 DIAGNOSIS — M5459 Other low back pain: Secondary | ICD-10-CM | POA: Diagnosis not present

## 2022-07-16 DIAGNOSIS — M546 Pain in thoracic spine: Secondary | ICD-10-CM
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
## 2022-07-28 ENCOUNTER — Other Ambulatory Visit: Payer: Self-pay | Admitting: Internal Medicine

## 2022-07-28 ENCOUNTER — Other Ambulatory Visit (HOSPITAL_BASED_OUTPATIENT_CLINIC_OR_DEPARTMENT_OTHER): Payer: Self-pay

## 2022-07-28 DIAGNOSIS — F331 Major depressive disorder, recurrent, moderate: Secondary | ICD-10-CM | POA: Diagnosis not present

## 2022-07-28 DIAGNOSIS — R519 Headache, unspecified: Secondary | ICD-10-CM

## 2022-07-28 DIAGNOSIS — F411 Generalized anxiety disorder: Secondary | ICD-10-CM | POA: Diagnosis not present

## 2022-07-28 DIAGNOSIS — F900 Attention-deficit hyperactivity disorder, predominantly inattentive type: Secondary | ICD-10-CM | POA: Diagnosis not present

## 2022-07-28 DIAGNOSIS — J302 Other seasonal allergic rhinitis: Secondary | ICD-10-CM

## 2022-07-28 DIAGNOSIS — F4312 Post-traumatic stress disorder, chronic: Secondary | ICD-10-CM | POA: Diagnosis not present

## 2022-07-28 MED ORDER — CETIRIZINE HCL 10 MG PO TABS
10.0000 mg | ORAL_TABLET | Freq: Every day | ORAL | 1 refills | Status: AC | PRN
Start: 2022-07-28 — End: ?
  Filled 2022-07-28 – 2023-05-01 (×2): qty 90, 90d supply, fill #0

## 2022-07-28 MED ORDER — AMPHETAMINE-DEXTROAMPHET ER 20 MG PO CP24
20.0000 mg | ORAL_CAPSULE | Freq: Every morning | ORAL | 0 refills | Status: DC
  Filled 2022-07-28: qty 30, 30d supply, fill #0

## 2022-07-28 MED ORDER — SERTRALINE HCL 50 MG PO TABS
75.0000 mg | ORAL_TABLET | Freq: Every day | ORAL | 2 refills | Status: DC
  Filled 2022-07-28: qty 45, 30d supply, fill #0

## 2022-07-29 ENCOUNTER — Other Ambulatory Visit (HOSPITAL_BASED_OUTPATIENT_CLINIC_OR_DEPARTMENT_OTHER): Payer: Self-pay

## 2022-07-29 ENCOUNTER — Ambulatory Visit: Payer: PRIVATE HEALTH INSURANCE | Attending: Internal Medicine | Admitting: Physical Therapy

## 2022-07-29 ENCOUNTER — Encounter: Payer: Self-pay | Admitting: Physical Therapy

## 2022-07-29 ENCOUNTER — Other Ambulatory Visit: Payer: Self-pay

## 2022-07-29 DIAGNOSIS — M546 Pain in thoracic spine: Secondary | ICD-10-CM | POA: Diagnosis present

## 2022-07-29 DIAGNOSIS — M5459 Other low back pain: Secondary | ICD-10-CM | POA: Insufficient documentation

## 2022-07-29 DIAGNOSIS — M6281 Muscle weakness (generalized): Secondary | ICD-10-CM | POA: Insufficient documentation

## 2022-08-03 ENCOUNTER — Telehealth: Payer: BC Managed Care – PPO | Admitting: Physician Assistant

## 2022-08-03 ENCOUNTER — Other Ambulatory Visit (HOSPITAL_BASED_OUTPATIENT_CLINIC_OR_DEPARTMENT_OTHER): Payer: Self-pay

## 2022-08-03 ENCOUNTER — Telehealth: Payer: BC Managed Care – PPO

## 2022-08-03 DIAGNOSIS — R112 Nausea with vomiting, unspecified: Secondary | ICD-10-CM

## 2022-08-03 MED ORDER — CIPROFLOXACIN HCL 500 MG PO TABS
500.0000 mg | ORAL_TABLET | Freq: Two times a day (BID) | ORAL | 0 refills | Status: AC
Start: 2022-08-03 — End: 2022-08-09
  Filled 2022-08-03: qty 10, 5d supply, fill #0

## 2022-08-03 MED ORDER — METRONIDAZOLE 500 MG PO TABS
500.0000 mg | ORAL_TABLET | Freq: Three times a day (TID) | ORAL | 0 refills | Status: AC
Start: 2022-08-03 — End: 2022-08-11
  Filled 2022-08-03: qty 21, 7d supply, fill #0

## 2022-08-03 MED ORDER — ONDANSETRON 4 MG PO TBDP
4.0000 mg | ORAL_TABLET | Freq: Three times a day (TID) | ORAL | 0 refills | Status: DC | PRN
Start: 2022-08-03 — End: 2023-02-25
  Filled 2022-08-03: qty 20, 7d supply, fill #0

## 2022-08-03 NOTE — Progress Notes (Signed)
Virtual Visit Consent   Jasmine Huerta, you are scheduled for a virtual visit with a Jasmine Huerta provider today. Just as with appointments in the office, your consent must be obtained to participate. Your consent will be active for this visit and any virtual visit you may have with one of our providers in the next 365 days. If you have a MyChart account, a copy of this consent can be sent to you electronically.  As this is a virtual visit, video technology does not allow for your provider to perform a traditional examination. This may limit your provider's ability to fully assess your condition. If your provider identifies any concerns that need to be evaluated in person or the need to arrange testing (such as labs, EKG, etc.), we will make arrangements to do so. Although advances in technology are sophisticated, we cannot ensure that it will always work on either your end or our end. If the connection with a video visit is poor, the visit may have to be switched to a telephone visit. With either a video or telephone visit, we are not always able to ensure that we have a secure connection.  By engaging in this virtual visit, you consent to the provision of healthcare and authorize for your insurance to be billed (if applicable) for the services provided during this visit. Depending on your insurance coverage, you may receive a charge related to this service.  I need to obtain your verbal consent now. Are you willing to proceed with your visit today? Jasmine Huerta has provided verbal consent on 08/03/2022 for a virtual visit (video or telephone). Jasmine Loveless, PA-C  Date: 08/03/2022 6:05 PM  Virtual Visit via Video Note   I, Jasmine Huerta, connected with  Jasmine Huerta  ([id number], 1985/03/05) on 08/03/22 at  6:00 PM EDT by a video-enabled telemedicine application and verified that I am speaking with the correct person using two identifiers.  Location: Patient: Virtual Visit Location Patient:  Mobile Provider: Virtual Visit Location Provider: Home Office   I discussed the limitations of evaluation and management by telemedicine and the availability of in person appointments. The patient expressed understanding and agreed to proceed.    History of Present Illness: Jasmine Huerta is a 37 y.o. who identifies as a female who was assigned female at birth, and is being seen today for nausea and vomiting.  HPI: Emesis  This is a new problem. The current episode started in the past 7 days (Friday immediately after eating Chipotle; had Lasik on Thursday). The problem occurs less than 2 times per day. The problem has been unchanged. The emesis has an appearance of stomach contents. Associated symptoms include abdominal pain, chills and dizziness (with prolonged walking). Associated symptoms comments: Nausea, had been constipated and did have a BM today that was green, then has been loose. Risk factors include suspect food intake (chipotle; Husband and son that both had chicken and sour cream with her are having similar symptoms; daughter had steak and has no symptoms). She has tried increased fluids and diet change for the symptoms. The treatment provided no relief.     Problems:  Patient Active Problem List   Diagnosis Date Noted   Hyperlipidemia associated with type 2 diabetes mellitus (HCC) 12/03/2021   Vitamin D deficiency 12/03/2021   DM (diabetes mellitus), type 2 (HCC) 06/07/2018   NSVD (normal spontaneous vaginal delivery) 09/16/2011   Noncompliance with medications 08/25/2011   Noncompliance of patient with dietary regimen 08/25/2011   GDM (  gestational diabetes mellitus) 08/25/2011   Gestational diabetes 07/16/2011   H/O gestational diabetes in prior pregnancy, currently pregnant 06/10/2011    Allergies: No Known Allergies Medications:  Current Outpatient Medications:    ciprofloxacin (CIPRO) 500 MG tablet, Take 1 tablet (500 mg total) by mouth 2 (two) times daily for 5 days., Disp:  10 tablet, Rfl: 0   metroNIDAZOLE (FLAGYL) 500 MG tablet, Take 1 tablet (500 mg total) by mouth 3 (three) times daily for 7 days., Disp: 21 tablet, Rfl: 0   ondansetron (ZOFRAN-ODT) 4 MG disintegrating tablet, Take 1 tablet (4 mg total) by mouth every 8 (eight) hours as needed., Disp: 20 tablet, Rfl: 0   amphetamine-dextroamphetamine (ADDERALL XR) 20 MG 24 hr capsule, Take one capsule by mouth in the morning, Disp: 30 capsule, Rfl: 0   amphetamine-dextroamphetamine (ADDERALL XR) 5 MG 24 hr capsule, Take 1 capsule (5 mg total) by mouth every morning., Disp: 30 capsule, Rfl: 0   atorvastatin (LIPITOR) 80 MG tablet, Take 1 tablet (80 mg total) by mouth daily., Disp: 90 tablet, Rfl: 1   cetirizine (ZYRTEC) 10 MG tablet, Take 1 tablet (10 mg total) by mouth daily as needed for allergies., Disp: 90 tablet, Rfl: 1   Continuous Blood Gluc Sensor (FREESTYLE LIBRE 3 SENSOR) MISC, Apply 1 sensor to the skin every 14 days., Disp: 2 each, Rfl: 11   Continuous Glucose Sensor (FREESTYLE LIBRE 3 SENSOR) MISC, Inject 1 each into the skin every 14 (fourteen) days., Disp: 2 each, Rfl: 11   dicyclomine (BENTYL) 20 MG tablet, TAKE 1 TABLET (20 MG TOTAL) BY MOUTH 3 (THREE) TIMES DAILY AS NEEDED FOR SPASMS., Disp: 270 tablet, Rfl: 1   metFORMIN (GLUCOPHAGE-XR) 500 MG 24 hr tablet, Take 1 tablet (500 mg total) by mouth daily with evening meal., Disp: 90 tablet, Rfl: 1   pantoprazole (PROTONIX) 40 MG tablet, Take 1 tablet (40 mg total) by mouth daily., Disp: 90 tablet, Rfl: 1   Semaglutide, 1 MG/DOSE, (OZEMPIC, 1 MG/DOSE,) 4 MG/3ML SOPN, Inject 1 mg into the skin once a week., Disp: 3 mL, Rfl: 3   Semaglutide, 1 MG/DOSE, (OZEMPIC, 1 MG/DOSE,) 4 MG/3ML SOPN, Inject 1 mg into the skin once a week., Disp: 3 mL, Rfl: 3   sertraline (ZOLOFT) 50 MG tablet, Take 1.5 tablets (75 mg total) by mouth daily., Disp: 45 tablet, Rfl: 2   Vitamin D, Ergocalciferol, (DRISDOL) 1.25 MG (50000 UNIT) CAPS capsule, Take 1 capsule (50,000 Units  total) by mouth every 7 (seven) days for 12 doses., Disp: 12 capsule, Rfl: 0  Observations/Objective: Patient is well-developed, well-nourished in no acute distress.  Resting comfortably  Head is normocephalic, atraumatic.  No labored breathing.  Speech is clear and coherent with logical content.  Patient is alert and oriented at baseline.    Assessment and Plan: 1. Nausea and vomiting, unspecified vomiting type - ondansetron (ZOFRAN-ODT) 4 MG disintegrating tablet; Take 1 tablet (4 mg total) by mouth every 8 (eight) hours as needed.  Dispense: 20 tablet; Refill: 0 - ciprofloxacin (CIPRO) 500 MG tablet; Take 1 tablet (500 mg total) by mouth 2 (two) times daily for 5 days.  Dispense: 10 tablet; Refill: 0 - metroNIDAZOLE (FLAGYL) 500 MG tablet; Take 1 tablet (500 mg total) by mouth 3 (three) times daily for 7 days.  Dispense: 21 tablet; Refill: 0  - Suspected GI illness from food borne source - Cipro and Flagyl for bacterial source potential - Zofran for nausea - Push fluids - Bland diet until nausea  and pain improve - Follow up in 2-3 days if not improving  Follow Up Instructions: I discussed the assessment and treatment plan with the patient. The patient was provided an opportunity to ask questions and all were answered. The patient agreed with the plan and demonstrated an understanding of the instructions.  A copy of instructions were sent to the patient via MyChart unless otherwise noted below.    The patient was advised to call back or seek an in-person evaluation if the symptoms worsen or if the condition fails to improve as anticipated.  Time:  I spent 10 minutes with the patient via telehealth technology discussing the above problems/concerns.    Jasmine Loveless, PA-C

## 2022-08-03 NOTE — Patient Instructions (Signed)
Jasmine Huerta, thank you for joining Margaretann Loveless, PA-C for today's virtual visit.  While this provider is not your primary care provider (PCP), if your PCP is located in our provider database this encounter information will be shared with them immediately following your visit.   A [facility] MyChart account gives you access to today's visit and all your visits, tests, and labs performed at [facility] " click here if you don't have a [facility] MyChart account or go to [email or web address]  Consent: (Patient) Jasmine Huerta provided verbal consent for this virtual visit at the beginning of the encounter.  Current Medications:  Current Outpatient Medications:    ciprofloxacin (CIPRO) 500 MG tablet, Take 1 tablet (500 mg total) by mouth 2 (two) times daily for 5 days., Disp: 10 tablet, Rfl: 0   metroNIDAZOLE (FLAGYL) 500 MG tablet, Take 1 tablet (500 mg total) by mouth 3 (three) times daily for 7 days., Disp: 21 tablet, Rfl: 0   ondansetron (ZOFRAN-ODT) 4 MG disintegrating tablet, Take 1 tablet (4 mg total) by mouth every 8 (eight) hours as needed., Disp: 20 tablet, Rfl: 0   amphetamine-dextroamphetamine (ADDERALL XR) 20 MG 24 hr capsule, Take one capsule by mouth in the morning, Disp: 30 capsule, Rfl: 0   amphetamine-dextroamphetamine (ADDERALL XR) 5 MG 24 hr capsule, Take 1 capsule (5 mg total) by mouth every morning., Disp: 30 capsule, Rfl: 0   atorvastatin (LIPITOR) 80 MG tablet, Take 1 tablet (80 mg total) by mouth daily., Disp: 90 tablet, Rfl: 1   cetirizine (ZYRTEC) 10 MG tablet, Take 1 tablet (10 mg total) by mouth daily as needed for allergies., Disp: 90 tablet, Rfl: 1   Continuous Blood Gluc Sensor (FREESTYLE LIBRE 3 SENSOR) MISC, Apply 1 sensor to the skin every 14 days., Disp: 2 each, Rfl: 11   Continuous Glucose Sensor (FREESTYLE LIBRE 3 SENSOR) MISC, Inject 1 each into the skin every 14 (fourteen) days., Disp: 2 each, Rfl: 11   dicyclomine (BENTYL) 20 MG tablet,  TAKE 1 TABLET (20 MG TOTAL) BY MOUTH 3 (THREE) TIMES DAILY AS NEEDED FOR SPASMS., Disp: 270 tablet, Rfl: 1   metFORMIN (GLUCOPHAGE-XR) 500 MG 24 hr tablet, Take 1 tablet (500 mg total) by mouth daily with evening meal., Disp: 90 tablet, Rfl: 1   pantoprazole (PROTONIX) 40 MG tablet, Take 1 tablet (40 mg total) by mouth daily., Disp: 90 tablet, Rfl: 1   Semaglutide, 1 MG/DOSE, (OZEMPIC, 1 MG/DOSE,) 4 MG/3ML SOPN, Inject 1 mg into the skin once a week., Disp: 3 mL, Rfl: 3   Semaglutide, 1 MG/DOSE, (OZEMPIC, 1 MG/DOSE,) 4 MG/3ML SOPN, Inject 1 mg into the skin once a week., Disp: 3 mL, Rfl: 3   sertraline (ZOLOFT) 50 MG tablet, Take 1.5 tablets (75 mg total) by mouth daily., Disp: 45 tablet, Rfl: 2   Vitamin D, Ergocalciferol, (DRISDOL) 1.25 MG (50000 UNIT) CAPS capsule, Take 1 capsule (50,000 Units total) by mouth every 7 (seven) days for 12 doses., Disp: 12 capsule, Rfl: 0   Medications ordered in this encounter:  Meds ordered this encounter  Medications   ondansetron (ZOFRAN-ODT) 4 MG disintegrating tablet    Sig: Take 1 tablet (4 mg total) by mouth every 8 (eight) hours as needed.    Dispense:  20 tablet    Refill:  0    Order Specific Question:   Supervising Provider    Answer:   Merrilee Jansky [id number]   ciprofloxacin (CIPRO) 500 MG tablet    Sig:  Take 1 tablet (500 mg total) by mouth 2 (two) times daily for 5 days.    Dispense:  10 tablet    Refill:  0    Order Specific Question:   Supervising Provider    Answer:   Merrilee Jansky [[id number]]   metroNIDAZOLE (FLAGYL) 500 MG tablet    Sig: Take 1 tablet (500 mg total) by mouth 3 (three) times daily for 7 days.    Dispense:  21 tablet    Refill:  0    Order Specific Question:   Supervising Provider    Answer:   Merrilee Jansky [id number]     *If you need refills on other medications prior to your next appointment, please contact your pharmacy*  Follow-Up: Call back or seek an in-person evaluation if the symptoms worsen or if  the condition fails to improve as anticipated.  [facility] [phone]  Other Instructions  Gastroenteritis You will learn about gastroenteritis, or stomach flu, symptoms, treatment, and when to seek medical care. To view the content, go to this web address: [email or web address]  This video will expire on: 04/01/2024. If you need access to this video following this date, please reach out to the healthcare provider who assigned it to you. This information is not intended to replace advice given to you by your health care provider. Make sure you discuss any questions you have with your health care provider. Elsevier Patient Education  2024 Elsevier Inc.    If you have been instructed to have an in-person evaluation today at a local Urgent Care facility, please use the link below. It will take you to a list of all of our available [facility], including address, phone number and hours of operation. Please do not delay care.  [facility]  If you or a family member do not have a primary care provider, use the link below to schedule a visit and establish care. When you choose a [facility] primary care physician or advanced practice provider, you gain a long-term partner in health. Find a Primary Care Provider  Learn more about [facility]'s in-office and virtual care options: [facility] - Get Care Now

## 2022-08-05 ENCOUNTER — Ambulatory Visit: Payer: PRIVATE HEALTH INSURANCE | Attending: Family Medicine | Admitting: Physical Therapy

## 2022-08-05 ENCOUNTER — Encounter: Payer: Self-pay | Admitting: Physical Therapy

## 2022-08-05 DIAGNOSIS — M6281 Muscle weakness (generalized): Secondary | ICD-10-CM | POA: Diagnosis not present

## 2022-08-05 DIAGNOSIS — M5459 Other low back pain: Secondary | ICD-10-CM | POA: Diagnosis not present

## 2022-08-05 DIAGNOSIS — M546 Pain in thoracic spine: Secondary | ICD-10-CM | POA: Insufficient documentation
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
## 2022-08-12 ENCOUNTER — Ambulatory Visit: Payer: PRIVATE HEALTH INSURANCE | Attending: Family Medicine | Admitting: Physical Therapy

## 2022-08-12 ENCOUNTER — Encounter: Payer: Self-pay | Admitting: Physical Therapy

## 2022-08-12 ENCOUNTER — Other Ambulatory Visit (HOSPITAL_BASED_OUTPATIENT_CLINIC_OR_DEPARTMENT_OTHER): Payer: Self-pay

## 2022-08-12 DIAGNOSIS — M6281 Muscle weakness (generalized): Secondary | ICD-10-CM | POA: Diagnosis not present

## 2022-08-12 DIAGNOSIS — M546 Pain in thoracic spine: Secondary | ICD-10-CM | POA: Diagnosis not present

## 2022-08-12 DIAGNOSIS — M5459 Other low back pain: Secondary | ICD-10-CM | POA: Insufficient documentation

## 2022-08-13 ENCOUNTER — Other Ambulatory Visit (HOSPITAL_COMMUNITY): Payer: Self-pay

## 2022-08-21 ENCOUNTER — Ambulatory Visit: Payer: Commercial Managed Care - PPO | Attending: Family Medicine | Admitting: Physical Therapy

## 2022-08-21 ENCOUNTER — Encounter: Payer: Self-pay | Admitting: Physical Therapy

## 2022-08-21 DIAGNOSIS — M546 Pain in thoracic spine: Secondary | ICD-10-CM | POA: Insufficient documentation

## 2022-08-21 DIAGNOSIS — M5459 Other low back pain: Secondary | ICD-10-CM | POA: Diagnosis not present

## 2022-08-21 DIAGNOSIS — M6281 Muscle weakness (generalized): Secondary | ICD-10-CM | POA: Insufficient documentation
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
## 2022-08-24 ENCOUNTER — Ambulatory Visit: Payer: Commercial Managed Care - PPO | Admitting: Physical Therapy

## 2022-08-24 DIAGNOSIS — M6281 Muscle weakness (generalized): Secondary | ICD-10-CM

## 2022-08-24 DIAGNOSIS — M5459 Other low back pain: Secondary | ICD-10-CM | POA: Diagnosis not present

## 2022-08-24 DIAGNOSIS — M546 Pain in thoracic spine: Secondary | ICD-10-CM | POA: Diagnosis not present
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
## 2022-08-25 DIAGNOSIS — F4312 Post-traumatic stress disorder, chronic: Secondary | ICD-10-CM | POA: Diagnosis not present

## 2022-08-25 DIAGNOSIS — F411 Generalized anxiety disorder: Secondary | ICD-10-CM | POA: Diagnosis not present

## 2022-08-25 DIAGNOSIS — F331 Major depressive disorder, recurrent, moderate: Secondary | ICD-10-CM | POA: Diagnosis not present

## 2022-08-25 DIAGNOSIS — F900 Attention-deficit hyperactivity disorder, predominantly inattentive type: Secondary | ICD-10-CM | POA: Diagnosis not present

## 2022-08-26 ENCOUNTER — Other Ambulatory Visit (HOSPITAL_BASED_OUTPATIENT_CLINIC_OR_DEPARTMENT_OTHER): Payer: Self-pay

## 2022-08-26 MED ORDER — AMPHETAMINE-DEXTROAMPHET ER 30 MG PO CP24
30.0000 mg | ORAL_CAPSULE | Freq: Every morning | ORAL | 0 refills | Status: DC
  Filled 2022-08-26: qty 30, 30d supply, fill #0

## 2022-09-03 ENCOUNTER — Ambulatory Visit: Payer: PRIVATE HEALTH INSURANCE | Attending: Family Medicine | Admitting: Physical Therapy

## 2022-09-03 DIAGNOSIS — M5459 Other low back pain: Secondary | ICD-10-CM | POA: Diagnosis present

## 2022-09-03 DIAGNOSIS — M6281 Muscle weakness (generalized): Secondary | ICD-10-CM | POA: Diagnosis present

## 2022-09-03 DIAGNOSIS — M546 Pain in thoracic spine: Secondary | ICD-10-CM | POA: Diagnosis present
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
## 2022-09-09 NOTE — Addendum Note (Signed)
Addended by: Milford Cage on: 09/09/2022 03:48 PM   Modules accepted: Orders

## 2022-09-10 ENCOUNTER — Ambulatory Visit: Payer: PRIVATE HEALTH INSURANCE | Admitting: Physical Therapy

## 2022-09-15 ENCOUNTER — Ambulatory Visit: Payer: PRIVATE HEALTH INSURANCE | Admitting: Physical Therapy

## 2022-09-15 ENCOUNTER — Encounter: Payer: Self-pay | Admitting: Physical Therapy

## 2022-09-15 ENCOUNTER — Other Ambulatory Visit (HOSPITAL_BASED_OUTPATIENT_CLINIC_OR_DEPARTMENT_OTHER): Payer: Self-pay

## 2022-09-15 DIAGNOSIS — M5459 Other low back pain: Secondary | ICD-10-CM

## 2022-09-15 DIAGNOSIS — M546 Pain in thoracic spine: Secondary | ICD-10-CM

## 2022-09-15 DIAGNOSIS — M6281 Muscle weakness (generalized): Secondary | ICD-10-CM
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
## 2022-09-22 ENCOUNTER — Encounter: Payer: Self-pay | Admitting: Physical Therapy

## 2022-09-22 ENCOUNTER — Ambulatory Visit: Payer: PRIVATE HEALTH INSURANCE | Admitting: Physical Therapy

## 2022-09-22 DIAGNOSIS — M6281 Muscle weakness (generalized): Secondary | ICD-10-CM

## 2022-09-22 DIAGNOSIS — M5459 Other low back pain: Secondary | ICD-10-CM | POA: Diagnosis not present

## 2022-09-22 DIAGNOSIS — M546 Pain in thoracic spine: Secondary | ICD-10-CM
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
## 2022-09-24 ENCOUNTER — Other Ambulatory Visit (HOSPITAL_BASED_OUTPATIENT_CLINIC_OR_DEPARTMENT_OTHER): Payer: Self-pay

## 2022-09-24 DIAGNOSIS — E229 Hyperfunction of pituitary gland, unspecified: Secondary | ICD-10-CM | POA: Diagnosis not present

## 2022-09-24 DIAGNOSIS — E1169 Type 2 diabetes mellitus with other specified complication: Secondary | ICD-10-CM | POA: Diagnosis not present

## 2022-09-24 DIAGNOSIS — E785 Hyperlipidemia, unspecified: Secondary | ICD-10-CM | POA: Diagnosis not present

## 2022-09-24 MED ORDER — OZEMPIC (1 MG/DOSE) 4 MG/3ML ~~LOC~~ SOPN
1.0000 mg | PEN_INJECTOR | SUBCUTANEOUS | 1 refills | Status: DC
  Filled 2022-09-24: qty 9, 84d supply, fill #0
  Filled 2022-12-27: qty 9, 84d supply, fill #1

## 2022-09-29 ENCOUNTER — Other Ambulatory Visit (HOSPITAL_BASED_OUTPATIENT_CLINIC_OR_DEPARTMENT_OTHER): Payer: Self-pay

## 2022-09-29 ENCOUNTER — Other Ambulatory Visit: Payer: Self-pay

## 2022-09-29 DIAGNOSIS — F411 Generalized anxiety disorder: Secondary | ICD-10-CM | POA: Diagnosis not present

## 2022-09-29 DIAGNOSIS — F900 Attention-deficit hyperactivity disorder, predominantly inattentive type: Secondary | ICD-10-CM | POA: Diagnosis not present

## 2022-09-29 DIAGNOSIS — F331 Major depressive disorder, recurrent, moderate: Secondary | ICD-10-CM | POA: Diagnosis not present

## 2022-09-29 DIAGNOSIS — F4312 Post-traumatic stress disorder, chronic: Secondary | ICD-10-CM | POA: Diagnosis not present

## 2022-09-29 MED ORDER — FLUOXETINE HCL 20 MG PO CAPS
20.0000 mg | ORAL_CAPSULE | Freq: Every morning | ORAL | 3 refills | Status: DC
  Filled 2022-09-29: qty 30, 30d supply, fill #0
  Filled 2022-10-16 – 2022-10-22 (×2): qty 30, 30d supply, fill #1

## 2022-09-29 MED ORDER — AMPHETAMINE-DEXTROAMPHET ER 30 MG PO CP24
30.0000 mg | ORAL_CAPSULE | Freq: Every morning | ORAL | 0 refills | Status: DC
  Filled 2022-09-29: qty 30, 30d supply, fill #0

## 2022-09-30 ENCOUNTER — Ambulatory Visit: Payer: Commercial Managed Care - PPO | Attending: Family Medicine | Admitting: Physical Therapy

## 2022-09-30 ENCOUNTER — Encounter: Payer: Self-pay | Admitting: Physical Therapy

## 2022-09-30 DIAGNOSIS — M546 Pain in thoracic spine: Secondary | ICD-10-CM | POA: Insufficient documentation

## 2022-09-30 DIAGNOSIS — M5459 Other low back pain: Secondary | ICD-10-CM | POA: Insufficient documentation

## 2022-09-30 DIAGNOSIS — M6281 Muscle weakness (generalized): Secondary | ICD-10-CM | POA: Diagnosis not present
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
## 2022-10-07 ENCOUNTER — Ambulatory Visit: Payer: BC Managed Care – PPO | Admitting: Physical Therapy

## 2022-10-14 ENCOUNTER — Other Ambulatory Visit (HOSPITAL_BASED_OUTPATIENT_CLINIC_OR_DEPARTMENT_OTHER): Payer: Self-pay

## 2022-10-15 ENCOUNTER — Encounter: Payer: Self-pay | Admitting: Internal Medicine

## 2022-10-15 ENCOUNTER — Other Ambulatory Visit (HOSPITAL_BASED_OUTPATIENT_CLINIC_OR_DEPARTMENT_OTHER): Payer: Self-pay

## 2022-10-15 ENCOUNTER — Other Ambulatory Visit: Payer: Self-pay

## 2022-10-16 ENCOUNTER — Other Ambulatory Visit (HOSPITAL_BASED_OUTPATIENT_CLINIC_OR_DEPARTMENT_OTHER): Payer: Self-pay

## 2022-10-16 ENCOUNTER — Other Ambulatory Visit: Payer: Self-pay | Admitting: Internal Medicine

## 2022-10-16 DIAGNOSIS — K219 Gastro-esophageal reflux disease without esophagitis: Secondary | ICD-10-CM

## 2022-10-18 ENCOUNTER — Other Ambulatory Visit (HOSPITAL_BASED_OUTPATIENT_CLINIC_OR_DEPARTMENT_OTHER): Payer: Self-pay

## 2022-10-19 ENCOUNTER — Other Ambulatory Visit (INDEPENDENT_AMBULATORY_CARE_PROVIDER_SITE_OTHER): Payer: Commercial Managed Care - PPO

## 2022-10-19 ENCOUNTER — Other Ambulatory Visit (HOSPITAL_BASED_OUTPATIENT_CLINIC_OR_DEPARTMENT_OTHER): Payer: Self-pay

## 2022-10-19 DIAGNOSIS — E559 Vitamin D deficiency, unspecified: Secondary | ICD-10-CM | POA: Diagnosis not present

## 2022-10-19 LAB — VITAMIN D 25 HYDROXY (VIT D DEFICIENCY, FRACTURES): VITD: 37.16 ng/mL (ref 30.00–100.00)

## 2022-10-19 MED ORDER — PANTOPRAZOLE SODIUM 40 MG PO TBEC
40.0000 mg | DELAYED_RELEASE_TABLET | Freq: Every day | ORAL | 1 refills | Status: AC
Start: 2022-10-19 — End: ?
  Filled 2022-10-19: qty 90, 90d supply, fill #0
  Filled 2023-05-01 (×2): qty 90, 90d supply, fill #1

## 2022-10-27 ENCOUNTER — Other Ambulatory Visit (HOSPITAL_BASED_OUTPATIENT_CLINIC_OR_DEPARTMENT_OTHER): Payer: Self-pay

## 2022-10-27 DIAGNOSIS — F331 Major depressive disorder, recurrent, moderate: Secondary | ICD-10-CM | POA: Diagnosis not present

## 2022-10-27 DIAGNOSIS — F4312 Post-traumatic stress disorder, chronic: Secondary | ICD-10-CM | POA: Diagnosis not present

## 2022-10-27 DIAGNOSIS — F411 Generalized anxiety disorder: Secondary | ICD-10-CM | POA: Diagnosis not present

## 2022-10-27 DIAGNOSIS — F900 Attention-deficit hyperactivity disorder, predominantly inattentive type: Secondary | ICD-10-CM | POA: Diagnosis not present

## 2022-10-27 MED ORDER — FLUOXETINE HCL 40 MG PO CAPS
40.0000 mg | ORAL_CAPSULE | Freq: Every morning | ORAL | 3 refills | Status: DC
  Filled 2022-10-27: qty 30, 30d supply, fill #0

## 2022-10-27 MED ORDER — AMPHETAMINE-DEXTROAMPHET ER 30 MG PO CP24
30.0000 mg | ORAL_CAPSULE | Freq: Every morning | ORAL | 0 refills | Status: DC
  Filled 2022-10-27: qty 30, 30d supply, fill #0

## 2022-12-08 ENCOUNTER — Other Ambulatory Visit (HOSPITAL_BASED_OUTPATIENT_CLINIC_OR_DEPARTMENT_OTHER): Payer: Self-pay

## 2022-12-08 DIAGNOSIS — F331 Major depressive disorder, recurrent, moderate: Secondary | ICD-10-CM | POA: Diagnosis not present

## 2022-12-08 DIAGNOSIS — F900 Attention-deficit hyperactivity disorder, predominantly inattentive type: Secondary | ICD-10-CM | POA: Diagnosis not present

## 2022-12-08 DIAGNOSIS — F411 Generalized anxiety disorder: Secondary | ICD-10-CM | POA: Diagnosis not present

## 2022-12-08 DIAGNOSIS — F4312 Post-traumatic stress disorder, chronic: Secondary | ICD-10-CM | POA: Diagnosis not present

## 2022-12-08 MED ORDER — ESCITALOPRAM OXALATE 10 MG PO TABS
10.0000 mg | ORAL_TABLET | Freq: Every evening | ORAL | 1 refills | Status: DC
  Filled 2022-12-08: qty 30, 30d supply, fill #0

## 2022-12-08 MED ORDER — AMPHETAMINE-DEXTROAMPHET ER 30 MG PO CP24
30.0000 mg | ORAL_CAPSULE | Freq: Every morning | ORAL | 0 refills | Status: DC
  Filled 2022-12-08: qty 30, 30d supply, fill #0

## 2022-12-24 ENCOUNTER — Encounter: Payer: Self-pay | Admitting: Physical Therapy

## 2022-12-24 ENCOUNTER — Other Ambulatory Visit: Payer: Self-pay

## 2022-12-24 ENCOUNTER — Ambulatory Visit: Payer: PRIVATE HEALTH INSURANCE | Attending: Orthopedic Surgery | Admitting: Physical Therapy

## 2022-12-24 DIAGNOSIS — M6281 Muscle weakness (generalized): Secondary | ICD-10-CM | POA: Insufficient documentation

## 2022-12-24 DIAGNOSIS — R252 Cramp and spasm: Secondary | ICD-10-CM

## 2022-12-24 DIAGNOSIS — M25562 Pain in left knee: Secondary | ICD-10-CM | POA: Diagnosis not present

## 2022-12-24 NOTE — Therapy (Signed)
OUTPATIENT PHYSICAL THERAPY LOWER EXTREMITY EVALUATION   Patient Name: Jasmine Huerta MRN: [id number] DOB:December 23, 1985, 37 y.o., female Today's Date: 12/24/2022  END OF SESSION:  PT End of Session - 12/24/22 1233     Visit Number 1    Authorization Type [facility]    PT Start Time 1233    PT Stop Time 1314    PT Time Calculation (min) 41 min    Activity Tolerance Patient tolerated treatment well    Behavior During Therapy WFL for tasks assessed/performed             Past Medical History:  Diagnosis Date   Anxiety    Arthritis    "My Dr said I do , I dont know."   Bipolar disorder (HCC) age 44   Depression    GDM (gestational diabetes mellitus)    history of GDM   GERD (gastroesophageal reflux disease)    Headache    migraines - 07/02/17-  none recently   Pre-diabetes    Past Surgical History:  Procedure Laterality Date   CHOLECYSTECTOMY N/A 07/05/2017   Procedure: LAPAROSCOPIC CHOLECYSTECTOMY;  Surgeon: Berna Bue, MD;  Location: [facility];  Service: General;  Laterality: N/A;   TUBAL LIGATION  09/17/2011   Procedure: POST PARTUM TUBAL LIGATION;  Surgeon: Purcell Nails, MD;  Location: [facility];  Service: Gynecology;  Laterality: Bilateral;   WISDOM TOOTH EXTRACTION     Patient Active Problem List   Diagnosis Date Noted   Hyperlipidemia associated with type 2 diabetes mellitus (HCC) 12/03/2021   Vitamin D deficiency 12/03/2021   DM (diabetes mellitus), type 2 (HCC) 06/07/2018   NSVD (normal spontaneous vaginal delivery) 09/16/2011   Noncompliance with medications 08/25/2011   Noncompliance of patient with dietary regimen 08/25/2011   GDM (gestational diabetes mellitus) 08/25/2011   Gestational diabetes 07/16/2011   H/O gestational diabetes in prior pregnancy, currently pregnant 06/10/2011    PCP: Philip Aspen, Limmie Patricia, MD   REFERRING PROVIDER: Sheral Apley, MD   REFERRING DIAG: [phone] (ICD-10-CM) - Left knee pain   THERAPY DIAG:  Acute pain of  left knee  Cramp and spasm  Muscle weakness (generalized)  Rationale for Evaluation and Treatment: Rehabilitation  ONSET DATE: 11/18/22  SUBJECTIVE:   SUBJECTIVE STATEMENT: Got hurt on 11/18/22 when a patient fell and her leg got caught up in the w/c. On light duty until f/u with MD 12/13. Possible LCL sprain. When walks quad tightens up and it feels like the bone is twisting, then pain shoots down lower leg. Wears hinged brace at work, not at home. Stairs okay at home unless she's already having pain.  PERTINENT HISTORY: DM, ADHD PAIN:  Are you having pain? Yes: NPRS scale: 3 up 5-6/10 Pain location: Left quads and across knee Pain description: quad tightness, burning/tingling in knee Aggravating factors: prolonged sitting x 30 min, prolonged walking  Relieving factors: elevate, ice or heat  PRECAUTIONS: None  RED FLAGS: None   WEIGHT BEARING RESTRICTIONS: No  FALLS:  Has patient fallen in last 6 months? Yes. Number of falls 1   LIVING ENVIRONMENT: Lives with: lives with their spouse Lives in: House/apartment Stairs: Yes: Internal: 13 steps; on left going up and External: 1 steps; none Has following equipment at home: None  OCCUPATION: Nurse tech  PLOF: Independent  PATIENT GOALS: I want to feel normal  NEXT MD VISIT: 01/15/23  OBJECTIVE:  Note: Objective measures were completed at Evaluation unless otherwise noted.  DIAGNOSTIC FINDINGS: XR - mild  OA  PATIENT SURVEYS:  LEFS 42 / 80 = 52.5 %  COGNITION: Overall cognitive status: Within functional limits for tasks assessed     SENSATION: WFL   MUSCLE LENGTH: Tight HS B  PALPATION: Increased tone and trigger points in L ant tib, peroneals and distal lateral quads B patellar mobility hypermobile  LOWER EXTREMITY ROM:  Active ROM Right eval Left eval  Hip flexion    Hip extension    Hip abduction    Hip adduction    Hip internal rotation    Hip external rotation    Knee flexion 124 120   Knee extension hyper ext  hyper ext   Ankle dorsiflexion    Ankle plantarflexion    Ankle inversion    Ankle eversion     (Blank rows = not tested)  LOWER EXTREMITY MMT: *pain  MMT Right eval Left eval  Hip flexion 4+ 4+* pain in quad  Hip extension 5 5  Hip abduction 5 5  Hip adduction    Hip internal rotation    Hip external rotation    Knee flexion    Knee extension 5 5  Ankle dorsiflexion 5 5* pain in quad  Ankle plantarflexion    Ankle inversion    Ankle eversion     (Blank rows = not tested)  LOWER EXTREMITY SPECIAL TESTS:  Knee special tests: Anterior drawer test: negative, Posterior drawer test: negative, Apley's compression test: negative, Apley's distraction test: negative, and varus/valgus stress - negative  GAIT: WNL    TODAY'S TREATMENT:                                                                                                                              DATE:   12/24/22 See pt ed and HEP Manual: Skilled palpation and monitoring of soft tissues during DN. STM to L ant tibialis and peroneals.  Trigger Point Dry-Needling  Treatment instructions: Expect mild to moderate muscle soreness. S/S of pneumothorax if dry needled over a lung field, and to seek immediate medical attention should they occur. Patient verbalized understanding of these instructions and education. Patient Consent Given: Yes Education handout provided: Previously provided Muscles treated: L ant tib and peroneals Electrical stimulation performed: No Parameters: N/A Treatment response/outcome: Twitch Response Elicited and Palpable Increase in Muscle Length   PATIENT EDUCATION:  Education details: PT eval findings, anticipated POC, initial HEP, and DN rational, procedure, outcomes, potential side effects, and recommended post-treatment exercises/activity  Person educated: Patient Education method: Explanation, Demonstration, and Handouts Education comprehension: verbalized  understanding and returned demonstration  HOME EXERCISE PROGRAM: Access Code: [email or web address] URL: [email or web address] Date: 12/24/2022 Prepared by: Raynelle Fanning  Exercises - Supine Quad Set  - 1 x daily - 7 x weekly - 2 sets - 10 reps - 5 sec hold - Active Straight Leg Raise with Quad Set  - 1 x daily - 7 x weekly - 2 sets - 10 reps - Seated Long Arc Quad  -  1 x daily - 7 x weekly - 2 sets - 10 reps - 5 sec hold - Supine Knee Extension Strengthening  - 1 x daily - 7 x weekly - 2 sets - 10 reps - 5 sec hold - Seated Ankle Plantarflexion Stretch  - 2 x daily - 7 x weekly - 1 sets - 3 reps - 30-60 sec hold  ASSESSMENT:  CLINICAL IMPRESSION: Patient is a 38 y.o. 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 She was caught up in the w/c during the fall and now has mild limitations in ROM, strength deficits and pain primarily with prolonged sitting and standing. She will benefit from skilled PT to address these deficits. She has positive neural tension in the ant lower leg and tightness in the ant tib, peroneals and lateral quads. Initial trial of DN/MT to ant tib resulted in significant decrease in tissue tension and decreased neural tension. She will benefit from more DN to the lateral quads next session.   OBJECTIVE IMPAIRMENTS: decreased activity tolerance, decreased ROM, decreased strength, increased muscle spasms, impaired flexibility, and pain.   ACTIVITY LIMITATIONS: sitting, standing, and locomotion level  PARTICIPATION LIMITATIONS: occupation  PERSONAL FACTORS: 1 comorbidity: DM  are also affecting patient's functional outcome.   REHAB POTENTIAL: Excellent  CLINICAL DECISION MAKING: Evolving/moderate complexity  EVALUATION COMPLEXITY: Low   SHORT TERM GOALS: Target date: 01/14/2023    Ind with initial HEP Baseline: Goal status: INITIAL  2.  Decreased knee pain by 25%. Baseline:  Goal  status: INITIAL   LONG TERM GOALS: Target date: 02/04/2023   Ind with advanced HEP and its progression Baseline:  Goal status: INITIAL  2. Pt able to sit and stand without knee pain to allow return to full duty at work Baseline:  Goal status: INITIAL  3.  Improved LE strength to 5/5 to improve function Baseline:  Goal status: INITIAL  4.  Able to climb stairs without pain Baseline:  Goal status: INITIAL  5.   Improved LEFS to 51 showing functional improvement Baseline:  Goal status:INITIAL   PLAN:  PT FREQUENCY: 2x/week  PT DURATION: 6 weeks  PLANNED INTERVENTIONS: 97164- PT Re-evaluation, 97110-Therapeutic exercises, 97530- Therapeutic activity, 97112- Neuromuscular re-education, 97535- Self Care, 84696- Manual therapy, 97116- Gait training, 97014- Electrical stimulation (unattended), 97035- Ultrasound, 29528- Ionotophoresis 4mg /ml Dexamethasone, Patient/Family education, Balance training, Stair training, Taping, Dry Needling, Joint mobilization, Spinal mobilization, Cryotherapy, and Moist heat  PLAN FOR NEXT SESSION: Assess response to DN and continue to lateral quads, add HS stretches to HEP, [employer], PT  12/24/2022, 1:38 PM

## 2022-12-27 ENCOUNTER — Other Ambulatory Visit (HOSPITAL_BASED_OUTPATIENT_CLINIC_OR_DEPARTMENT_OTHER): Payer: Self-pay

## 2023-01-04 NOTE — Therapy (Signed)
OUTPATIENT PHYSICAL THERAPY LOWER EXTREMITY EVALUATION   Patient Name: Jasmine Huerta MRN: [id number] DOB:12/01/1985, 37 y.o., female Today's Date: 01/05/2023  END OF SESSION:  PT End of Session - 01/05/23 0802     Visit Number 2    Authorization Type [facility]    PT Start Time 0804    PT Stop Time 0844    PT Time Calculation (min) 40 min    Activity Tolerance Patient tolerated treatment well    Behavior During Therapy WFL for tasks assessed/performed              Past Medical History:  Diagnosis Date   Anxiety    Arthritis    "My Dr said I do , I dont know."   Bipolar disorder (HCC) age 40   Depression    GDM (gestational diabetes mellitus)    history of GDM   GERD (gastroesophageal reflux disease)    Headache    migraines - 07/02/17-  none recently   Pre-diabetes    Past Surgical History:  Procedure Laterality Date   CHOLECYSTECTOMY N/A 07/05/2017   Procedure: LAPAROSCOPIC CHOLECYSTECTOMY;  Surgeon: Berna Bue, MD;  Location: [facility];  Service: General;  Laterality: N/A;   TUBAL LIGATION  09/17/2011   Procedure: POST PARTUM TUBAL LIGATION;  Surgeon: Purcell Nails, MD;  Location: [facility];  Service: Gynecology;  Laterality: Bilateral;   WISDOM TOOTH EXTRACTION     Patient Active Problem List   Diagnosis Date Noted   Hyperlipidemia associated with type 2 diabetes mellitus (HCC) 12/03/2021   Vitamin D deficiency 12/03/2021   DM (diabetes mellitus), type 2 (HCC) 06/07/2018   NSVD (normal spontaneous vaginal delivery) 09/16/2011   Noncompliance with medications 08/25/2011   Noncompliance of patient with dietary regimen 08/25/2011   GDM (gestational diabetes mellitus) 08/25/2011   Gestational diabetes 07/16/2011   H/O gestational diabetes in prior pregnancy, currently pregnant 06/10/2011    PCP: Philip Aspen, Limmie Patricia, MD   REFERRING PROVIDER: Sheral Apley, MD   REFERRING DIAG: [phone] (ICD-10-CM) - Left knee pain   THERAPY DIAG:  Acute pain of  left knee  Cramp and spasm  Muscle weakness (generalized)  Rationale for Evaluation and Treatment: Rehabilitation  ONSET DATE: 11/18/22  SUBJECTIVE:   SUBJECTIVE STATEMENT: The needling felt amazing, but I need to where the brace otherwise the knee hurts.  PERTINENT HISTORY: DM, ADHD PAIN:  Are you having pain? Yes: NPRS scale: 0/10 Pain location: Left quads and across knee Pain description: quad tightness, burning/tingling in knee Aggravating factors: prolonged sitting x 30 min, prolonged walking  Relieving factors: elevate, ice or heat  PRECAUTIONS: None  RED FLAGS: None   WEIGHT BEARING RESTRICTIONS: No  FALLS:  Has patient fallen in last 6 months? Yes. Number of falls 1   LIVING ENVIRONMENT: Lives with: lives with their spouse Lives in: House/apartment Stairs: Yes: Internal: 13 steps; on left going up and External: 1 steps; none Has following equipment at home: None  OCCUPATION: Nurse tech  PLOF: Independent  PATIENT GOALS: I want to feel normal  NEXT MD VISIT: 01/15/23  OBJECTIVE:  Note: Objective measures were completed at Evaluation unless otherwise noted.  DIAGNOSTIC FINDINGS: XR - mild OA  PATIENT SURVEYS:  LEFS 42 / 80 = 52.5 %  COGNITION: Overall cognitive status: Within functional limits for tasks assessed     SENSATION: WFL   MUSCLE LENGTH: Tight HS B  PALPATION: Increased tone and trigger points in L ant tib, peroneals and distal  lateral quads B patellar mobility hypermobile  LOWER EXTREMITY ROM:  Active ROM Right eval Left eval  Hip flexion    Hip extension    Hip abduction    Hip adduction    Hip internal rotation    Hip external rotation    Knee flexion 124 120  Knee extension hyper ext  hyper ext   Ankle dorsiflexion    Ankle plantarflexion    Ankle inversion    Ankle eversion     (Blank rows = not tested)  LOWER EXTREMITY MMT: *pain  MMT Right eval Left eval  Hip flexion 4+ 4+* pain in quad  Hip  extension 5 5  Hip abduction 5 5  Hip adduction    Hip internal rotation    Hip external rotation    Knee flexion    Knee extension 5 5  Ankle dorsiflexion 5 5* pain in quad  Ankle plantarflexion    Ankle inversion    Ankle eversion     (Blank rows = not tested)  LOWER EXTREMITY SPECIAL TESTS:  Knee special tests: Anterior drawer test: negative, Posterior drawer test: negative, Apley's compression test: negative, Apley's distraction test: negative, and varus/valgus stress - negative  GAIT: WNL    TODAY'S TREATMENT:                                                                                                                              DATE:   01/05/23 Nustep L5 x 5 min L SLR x 20 L SLR with ER x 20 L SAQ x 20 5 sec hold Supine HS stretch L  2x30 sec Prone quad stretch L 2x30 sec  Manual: Skilled palpation and monitoring of soft tissues during DN. STM to L quadriceps. Trigger Point Dry-Needling  Treatment instructions: Expect mild to moderate muscle soreness. S/S of pneumothorax if dry needled over a lung field, and to seek immediate medical attention should they occur. Patient verbalized understanding of these instructions and education. Patient Consent Given: Yes Education handout provided: Previously provided Muscles treated: L quads (VLO, RF and VI) Electrical stimulation performed: Yes Parameters: 2-5 mA x 5 min, then microcurrent x 5 min Treatment response/outcome: Twitch Response Elicited and Palpable Increase in Muscle Length   12/24/22 See pt ed and HEP Manual: Skilled palpation and monitoring of soft tissues during DN. STM to L ant tibialis and peroneals.  Trigger Point Dry-Needling  Treatment instructions: Expect mild to moderate muscle soreness. S/S of pneumothorax if dry needled over a lung field, and to seek immediate medical attention should they occur. Patient verbalized understanding of these instructions and education. Patient Consent Given:  Yes Education handout provided: Previously provided Muscles treated: L ant tib and peroneals Electrical stimulation performed: No Parameters: N/A Treatment response/outcome: Twitch Response Elicited and Palpable Increase in Muscle Length   PATIENT EDUCATION:  Education details: PT eval findings, anticipated POC, initial HEP, and DN rational, procedure, outcomes, potential side effects, and recommended post-treatment  exercises/activity  Person educated: Patient Education method: Explanation, Demonstration, and Handouts Education comprehension: verbalized understanding and returned demonstration  HOME EXERCISE PROGRAM: Access Code: [email or web address] URL: [email or web address] Date: 01/05/2023 Prepared by: Raynelle Fanning  Exercises - Supine Quad Set  - 1 x daily - 7 x weekly - 2 sets - 10 reps - 5 sec hold - Active Straight Leg Raise with Quad Set  - 1 x daily - 7 x weekly - 2 sets - 10 reps - Seated Long Arc Quad  - 1 x daily - 7 x weekly - 2 sets - 10 reps - 5 sec hold - Supine Knee Extension Strengthening  - 1 x daily - 7 x weekly - 2 sets - 10 reps - 5 sec hold - Seated Ankle Plantarflexion Dorsiflexion PROM  - 2 x daily - 7 x weekly - 1 sets - 3 reps - 30-60 sec hold - Supine Hamstring Stretch  - 2 x daily - 7 x weekly - 1 sets - 3 reps - 30 sec hold - Straight Leg Raise with External Rotation  - 1 x daily - 7 x weekly - 2 sets - 10 reps  ASSESSMENT:  CLINICAL IMPRESSION: Patient reports no pain in her lower leg since last visit. She finds the SLR exercises "heavy", but is able to complete them. She seems to have some sciatic nerve tension in the L leg, however prior low back issues were right sided. Excellent response to DN/MT in L quads today with decreased tissue tension noted after. Patient continues to demonstrate potential for improvement and would benefit from continued skilled therapy to address impairments.      OBJECTIVE IMPAIRMENTS: decreased activity tolerance, decreased  ROM, decreased strength, increased muscle spasms, impaired flexibility, and pain.   ACTIVITY LIMITATIONS: sitting, standing, and locomotion level  PARTICIPATION LIMITATIONS: occupation  PERSONAL FACTORS: 1 comorbidity: DM  are also affecting patient's functional outcome.   REHAB POTENTIAL: Excellent  CLINICAL DECISION MAKING: Evolving/moderate complexity  EVALUATION COMPLEXITY: Low   SHORT TERM GOALS: Target date: 01/14/2023    Ind with initial HEP Baseline: Goal status: INITIAL  2.  Decreased knee pain by 25%. Baseline:  Goal status: INITIAL   LONG TERM GOALS: Target date: 02/04/2023   Ind with advanced HEP and its progression Baseline:  Goal status: INITIAL  2. Pt able to sit and stand without knee pain to allow return to full duty at work Baseline:  Goal status: INITIAL  3.  Improved LE strength to 5/5 to improve function Baseline:  Goal status: INITIAL  4.  Able to climb stairs without pain Baseline:  Goal status: INITIAL  5.   Improved LEFS to 51 showing functional improvement Baseline:  Goal status:INITIAL   PLAN:  PT FREQUENCY: 2x/week  PT DURATION: 6 weeks  PLANNED INTERVENTIONS: 97164- PT Re-evaluation, 97110-Therapeutic exercises, 97530- Therapeutic activity, 97112- Neuromuscular re-education, 97535- Self Care, 54098- Manual therapy, [id number]- Gait training, 97014- Electrical stimulation (unattended), 97035- Ultrasound, 11914- Ionotophoresis 4mg /ml Dexamethasone, Patient/Family education, Balance training, Stair training, Taping, Dry Needling, Joint mobilization, Spinal mobilization, Cryotherapy, and Moist heat  PLAN FOR NEXT SESSION: Assess response to DN and continue to lateral quads, add HS stretches to HEP, [employer], PT  01/05/2023, 8:54 AM

## 2023-01-05 ENCOUNTER — Encounter: Payer: Self-pay | Admitting: Physical Therapy

## 2023-01-05 ENCOUNTER — Ambulatory Visit: Payer: PRIVATE HEALTH INSURANCE | Attending: Orthopedic Surgery | Admitting: Physical Therapy

## 2023-01-05 DIAGNOSIS — M25562 Pain in left knee: Secondary | ICD-10-CM | POA: Diagnosis present

## 2023-01-05 DIAGNOSIS — M6281 Muscle weakness (generalized): Secondary | ICD-10-CM | POA: Diagnosis present

## 2023-01-05 DIAGNOSIS — R252 Cramp and spasm: Secondary | ICD-10-CM

## 2023-01-07 ENCOUNTER — Ambulatory Visit: Payer: Self-pay

## 2023-01-07 DIAGNOSIS — M6281 Muscle weakness (generalized): Secondary | ICD-10-CM

## 2023-01-07 DIAGNOSIS — M25562 Pain in left knee: Secondary | ICD-10-CM | POA: Diagnosis not present

## 2023-01-07 DIAGNOSIS — R252 Cramp and spasm: Secondary | ICD-10-CM

## 2023-01-07 NOTE — Therapy (Signed)
OUTPATIENT PHYSICAL THERAPY TREATMENT   Patient Name: Jasmine Huerta MRN: [id number] DOB:02/28/1985, 37 y.o., female Today's Date: 01/07/2023  END OF SESSION:  PT End of Session - 01/07/23 1106     Visit Number 3    Date for PT Re-Evaluation 02/04/23    Authorization Type [facility]- Workers comp approved 7 visits    Authorization - Visit Number 3    Authorization - Number of Visits 7    PT Start Time 1016    PT Stop Time 1102    PT Time Calculation (min) 46 min    Activity Tolerance Patient tolerated treatment well    Behavior During Therapy WFL for tasks assessed/performed               Past Medical History:  Diagnosis Date   Anxiety    Arthritis    "My Dr said I do , I dont know."   Bipolar disorder (HCC) age 21   Depression    GDM (gestational diabetes mellitus)    history of GDM   GERD (gastroesophageal reflux disease)    Headache    migraines - 07/02/17-  none recently   Pre-diabetes    Past Surgical History:  Procedure Laterality Date   CHOLECYSTECTOMY N/A 07/05/2017   Procedure: LAPAROSCOPIC CHOLECYSTECTOMY;  Surgeon: Berna Bue, MD;  Location: [facility];  Service: General;  Laterality: N/A;   TUBAL LIGATION  09/17/2011   Procedure: POST PARTUM TUBAL LIGATION;  Surgeon: Purcell Nails, MD;  Location: [facility];  Service: Gynecology;  Laterality: Bilateral;   WISDOM TOOTH EXTRACTION     Patient Active Problem List   Diagnosis Date Noted   Hyperlipidemia associated with type 2 diabetes mellitus (HCC) 12/03/2021   Vitamin D deficiency 12/03/2021   DM (diabetes mellitus), type 2 (HCC) 06/07/2018   NSVD (normal spontaneous vaginal delivery) 09/16/2011   Noncompliance with medications 08/25/2011   Noncompliance of patient with dietary regimen 08/25/2011   GDM (gestational diabetes mellitus) 08/25/2011   Gestational diabetes 07/16/2011   H/O gestational diabetes in prior pregnancy, currently pregnant 06/10/2011    PCP: Philip Aspen, Limmie Patricia,  MD   REFERRING PROVIDER: Sheral Apley, MD   REFERRING DIAG: [phone] (ICD-10-CM) - Left knee pain   THERAPY DIAG:  Acute pain of left knee  Cramp and spasm  Muscle weakness (generalized)  Rationale for Evaluation and Treatment: Rehabilitation  ONSET DATE: 11/18/22  SUBJECTIVE:   SUBJECTIVE STATEMENT: I am painful below my knee today.   PERTINENT HISTORY: DM, ADHD PAIN: 01/07/23:  Are you having pain? Yes: NPRS scale: 5/10 Pain location: below Lt knee Pain description: quad tightness, burning/tingling in knee Aggravating factors: prolonged sitting x 30 min, prolonged walking  Relieving factors: elevate, ice or heat  PRECAUTIONS: None  RED FLAGS: None   WEIGHT BEARING RESTRICTIONS: No  FALLS:  Has patient fallen in last 6 months? Yes. Number of falls 1   LIVING ENVIRONMENT: Lives with: lives with their spouse Lives in: House/apartment Stairs: Yes: Internal: 13 steps; on left going up and External: 1 steps; none Has following equipment at home: None  OCCUPATION: Nurse tech  PLOF: Independent  PATIENT GOALS: I want to feel normal  NEXT MD VISIT: 01/15/23  OBJECTIVE:  Note: Objective measures were completed at Evaluation unless otherwise noted.  DIAGNOSTIC FINDINGS: XR - mild OA  PATIENT SURVEYS:  LEFS 42 / 80 = 52.5 %  COGNITION: Overall cognitive status: Within functional limits for tasks assessed     SENSATION: [facility]  MUSCLE LENGTH: Tight HS B  PALPATION: Increased tone and trigger points in L ant tib, peroneals and distal lateral quads B patellar mobility hypermobile  LOWER EXTREMITY ROM:  Active ROM Right eval Left eval  Hip flexion    Hip extension    Hip abduction    Hip adduction    Hip internal rotation    Hip external rotation    Knee flexion 124 120  Knee extension hyper ext  hyper ext   Ankle dorsiflexion    Ankle plantarflexion    Ankle inversion    Ankle eversion     (Blank rows = not tested)  LOWER EXTREMITY  MMT: *pain  MMT Right eval Left eval  Hip flexion 4+ 4+* pain in quad  Hip extension 5 5  Hip abduction 5 5  Hip adduction    Hip internal rotation    Hip external rotation    Knee flexion    Knee extension 5 5  Ankle dorsiflexion 5 5* pain in quad  Ankle plantarflexion    Ankle inversion    Ankle eversion     (Blank rows = not tested)  LOWER EXTREMITY SPECIAL TESTS:  Knee special tests: Anterior drawer test: negative, Posterior drawer test: negative, Apley's compression test: negative, Apley's distraction test: negative, and varus/valgus stress - negative  GAIT: WNL    TODAY'S TREATMENT:                                                                                                                              DATE:   01/07/23 Nustep L2 x 6 min- legs only.  Pt present to discuss progress  Standing rockerboard: x3 minutes  Weightshifting on balance pad: medial/lateral x2 min 4" step-up:  forward and lateral x10 each  Lt SLR x 20 Seated HS stretch L  2x30 sec Manual: Skilled palpation and monitoring of soft tissues during DN. STM Lt tibialis anterior  Trigger Point Dry-Needling  Treatment instructions: Expect mild to moderate muscle soreness. S/S of pneumothorax if dry needled over a lung field, and to seek immediate medical attention should they occur. Patient verbalized understanding of these instructions and education. Patient Consent Given: Yes Education handout provided: Previously provided Muscles treated:Lt ant tib and peroneals Treatment response/outcome: Twitch Response Elicited and Palpable Increase in Muscle Length  01/05/23 Nustep L5 x 5 min L SLR x 20 L SLR with ER x 20 L SAQ x 20 5 sec hold Supine HS stretch L  2x30 sec Prone quad stretch L 2x30 sec  Manual: Skilled palpation and monitoring of soft tissues during DN. STM to L quadriceps. Trigger Point Dry-Needling  Treatment instructions: Expect mild to moderate muscle soreness. S/S of pneumothorax if  dry needled over a lung field, and to seek immediate medical attention should they occur. Patient verbalized understanding of these instructions and education. Patient Consent Given: Yes Education handout provided: Previously provided Muscles treated: L quads (VLO, RF and VI) Electrical stimulation  performed: Yes Parameters: 2-5 mA x 5 min, then microcurrent x 5 min Treatment response/outcome: Twitch Response Elicited and Palpable Increase in Muscle Length   12/24/22 See pt ed and HEP Manual: Skilled palpation and monitoring of soft tissues during DN. STM to L ant tibialis and peroneals.  Trigger Point Dry-Needling  Treatment instructions: Expect mild to moderate muscle soreness. S/S of pneumothorax if dry needled over a lung field, and to seek immediate medical attention should they occur. Patient verbalized understanding of these instructions and education. Patient Consent Given: Yes Education handout provided: Previously provided Muscles treated: L ant tib and peroneals Electrical stimulation performed: No Parameters: N/A Treatment response/outcome: Twitch Response Elicited and Palpable Increase in Muscle Length   PATIENT EDUCATION:  Education details: PT eval findings, anticipated POC, initial HEP, and DN rational, procedure, outcomes, potential side effects, and recommended post-treatment exercises/activity  Person educated: Patient Education method: Explanation, Demonstration, and Handouts Education comprehension: verbalized understanding and returned demonstration  HOME EXERCISE PROGRAM: Access Code: [email or web address] URL: [email or web address] Date: 01/05/2023 Prepared by: Raynelle Fanning  Exercises - Supine Quad Set  - 1 x daily - 7 x weekly - 2 sets - 10 reps - 5 sec hold - Active Straight Leg Raise with Quad Set  - 1 x daily - 7 x weekly - 2 sets - 10 reps - Seated Long Arc Quad  - 1 x daily - 7 x weekly - 2 sets - 10 reps - 5 sec hold - Supine Knee Extension Strengthening   - 1 x daily - 7 x weekly - 2 sets - 10 reps - 5 sec hold - Seated Ankle Plantarflexion Dorsiflexion PROM  - 2 x daily - 7 x weekly - 1 sets - 3 reps - 30-60 sec hold - Supine Hamstring Stretch  - 2 x daily - 7 x weekly - 1 sets - 3 reps - 30 sec hold - Straight Leg Raise with External Rotation  - 1 x daily - 7 x weekly - 2 sets - 10 reps  ASSESSMENT:  CLINICAL IMPRESSION: Pt reports 40% overall improvement in symptoms since the start of care. Pt reports that she is having more pain in the leg below the knee at TA/peroneals today. No pain in Lt knee joint today. Pt did well with addition of standing exercises today.   Excellent response to DN/MT in Lt tibialis anterior and peroneals today with decreased tissue tension noted after. Patient continues to demonstrate potential for improvement and would benefit from continued skilled therapy to address impairments.    OBJECTIVE IMPAIRMENTS: decreased activity tolerance, decreased ROM, decreased strength, increased muscle spasms, impaired flexibility, and pain.   ACTIVITY LIMITATIONS: sitting, standing, and locomotion level  PARTICIPATION LIMITATIONS: occupation  PERSONAL FACTORS: 1 comorbidity: DM  are also affecting patient's functional outcome.   REHAB POTENTIAL: Excellent  CLINICAL DECISION MAKING: Evolving/moderate complexity  EVALUATION COMPLEXITY: Low   SHORT TERM GOALS: Target date: 01/14/2023    Ind with initial HEP Baseline: Goal status: MET  2.  Decreased knee pain by 25%. Baseline:  Goal status: INITIAL   LONG TERM GOALS: Target date: 02/04/2023   Ind with advanced HEP and its progression Baseline:  Goal status: INITIAL  2. Pt able to sit and stand without knee pain to allow return to full duty at work Baseline:  Goal status: INITIAL  3.  Improved LE strength to 5/5 to improve function Baseline:  Goal status: INITIAL  4.  Able to climb stairs without pain Baseline:  Goal status: INITIAL  5.   Improved LEFS to  51 showing functional improvement Baseline:  Goal status:INITIAL   PLAN:  PT FREQUENCY: 2x/week  PT DURATION: 6 weeks  PLANNED INTERVENTIONS: 97164- PT Re-evaluation, 97110-Therapeutic exercises, 97530- Therapeutic activity, 97112- Neuromuscular re-education, 97535- Self Care, 13086- Manual therapy, 97116- Gait training, 97014- Electrical stimulation (unattended), 97035- Ultrasound, 57846- Ionotophoresis 4mg /ml Dexamethasone, Patient/Family education, Balance training, Stair training, Taping, Dry Needling, Joint mobilization, Spinal mobilization, Cryotherapy, and Moist heat  PLAN FOR NEXT SESSION: Assess response to DN and continue to lateral quads or distal leg, add HS stretches to HEP, [employer], PT 01/07/23 11:09 AM

## 2023-01-11 ENCOUNTER — Other Ambulatory Visit: Payer: Self-pay

## 2023-01-11 ENCOUNTER — Other Ambulatory Visit (HOSPITAL_BASED_OUTPATIENT_CLINIC_OR_DEPARTMENT_OTHER): Payer: Self-pay

## 2023-01-11 DIAGNOSIS — F331 Major depressive disorder, recurrent, moderate: Secondary | ICD-10-CM | POA: Diagnosis not present

## 2023-01-11 DIAGNOSIS — F4312 Post-traumatic stress disorder, chronic: Secondary | ICD-10-CM | POA: Diagnosis not present

## 2023-01-11 DIAGNOSIS — F411 Generalized anxiety disorder: Secondary | ICD-10-CM | POA: Diagnosis not present

## 2023-01-11 DIAGNOSIS — F900 Attention-deficit hyperactivity disorder, predominantly inattentive type: Secondary | ICD-10-CM | POA: Diagnosis not present

## 2023-01-11 MED ORDER — ESCITALOPRAM OXALATE 10 MG PO TABS
10.0000 mg | ORAL_TABLET | Freq: Every evening | ORAL | 1 refills | Status: DC
  Filled 2023-01-11: qty 30, 30d supply, fill #0

## 2023-01-11 MED ORDER — AMPHETAMINE-DEXTROAMPHET ER 30 MG PO CP24
30.0000 mg | ORAL_CAPSULE | Freq: Every morning | ORAL | 0 refills | Status: DC
  Filled 2023-01-11: qty 30, 30d supply, fill #0

## 2023-01-11 NOTE — Therapy (Signed)
OUTPATIENT PHYSICAL THERAPY TREATMENT   Patient Name: Temisha Sabin MRN: [id number] DOB:15-Aug-1985, 37 y.o., female Today's Date: 01/11/2023  END OF SESSION:      Past Medical History:  Diagnosis Date   Anxiety    Arthritis    "My Dr said I do , I dont know."   Bipolar disorder (HCC) age 45   Depression    GDM (gestational diabetes mellitus)    history of GDM   GERD (gastroesophageal reflux disease)    Headache    migraines - 07/02/17-  none recently   Pre-diabetes    Past Surgical History:  Procedure Laterality Date   CHOLECYSTECTOMY N/A 07/05/2017   Procedure: LAPAROSCOPIC CHOLECYSTECTOMY;  Surgeon: Berna Bue, MD;  Location: [facility];  Service: General;  Laterality: N/A;   TUBAL LIGATION  09/17/2011   Procedure: POST PARTUM TUBAL LIGATION;  Surgeon: Purcell Nails, MD;  Location: [facility];  Service: Gynecology;  Laterality: Bilateral;   WISDOM TOOTH EXTRACTION     Patient Active Problem List   Diagnosis Date Noted   Hyperlipidemia associated with type 2 diabetes mellitus (HCC) 12/03/2021   Vitamin D deficiency 12/03/2021   DM (diabetes mellitus), type 2 (HCC) 06/07/2018   NSVD (normal spontaneous vaginal delivery) 09/16/2011   Noncompliance with medications 08/25/2011   Noncompliance of patient with dietary regimen 08/25/2011   GDM (gestational diabetes mellitus) 08/25/2011   Gestational diabetes 07/16/2011   H/O gestational diabetes in prior pregnancy, currently pregnant 06/10/2011    PCP: Philip Aspen, Limmie Patricia, MD   REFERRING PROVIDER: Sheral Apley, MD   REFERRING DIAG: [phone] (ICD-10-CM) - Left knee pain   THERAPY DIAG:  No diagnosis found.  Rationale for Evaluation and Treatment: Rehabilitation  ONSET DATE: 11/18/22  SUBJECTIVE:   SUBJECTIVE STATEMENT: ***  PERTINENT HISTORY: DM, ADHD PAIN: 01/07/23:  Are you having pain? Yes: NPRS scale: 5/10 Pain location: below Lt knee Pain description: quad tightness, burning/tingling in  knee Aggravating factors: prolonged sitting x 30 min, prolonged walking  Relieving factors: elevate, ice or heat  PRECAUTIONS: None  RED FLAGS: None   WEIGHT BEARING RESTRICTIONS: No  FALLS:  Has patient fallen in last 6 months? Yes. Number of falls 1   LIVING ENVIRONMENT: Lives with: lives with their spouse Lives in: House/apartment Stairs: Yes: Internal: 13 steps; on left going up and External: 1 steps; none Has following equipment at home: None  OCCUPATION: Nurse tech  PLOF: Independent  PATIENT GOALS: I want to feel normal  NEXT MD VISIT: 01/15/23  OBJECTIVE:  Note: Objective measures were completed at Evaluation unless otherwise noted.  DIAGNOSTIC FINDINGS: XR - mild OA  PATIENT SURVEYS:  LEFS 42 / 80 = 52.5 %  COGNITION: Overall cognitive status: Within functional limits for tasks assessed     SENSATION: WFL   MUSCLE LENGTH: Tight HS B  PALPATION: Increased tone and trigger points in L ant tib, peroneals and distal lateral quads B patellar mobility hypermobile  LOWER EXTREMITY ROM:  Active ROM Right eval Left eval  Hip flexion    Hip extension    Hip abduction    Hip adduction    Hip internal rotation    Hip external rotation    Knee flexion 124 120  Knee extension hyper ext  hyper ext   Ankle dorsiflexion    Ankle plantarflexion    Ankle inversion    Ankle eversion     (Blank rows = not tested)  LOWER EXTREMITY MMT: *pain  MMT Right eval  Left eval  Hip flexion 4+ 4+* pain in quad  Hip extension 5 5  Hip abduction 5 5  Hip adduction    Hip internal rotation    Hip external rotation    Knee flexion    Knee extension 5 5  Ankle dorsiflexion 5 5* pain in quad  Ankle plantarflexion    Ankle inversion    Ankle eversion     (Blank rows = not tested)  LOWER EXTREMITY SPECIAL TESTS:  Knee special tests: Anterior drawer test: negative, Posterior drawer test: negative, Apley's compression test: negative, Apley's distraction test:  negative, and varus/valgus stress - negative  GAIT: WNL    TODAY'S TREATMENT:                                                                                                                              DATE:   01/12/23 Nustep L2 x 6 min- legs only.  Pt present to discuss progress  Standing rockerboard: x3 minutes  Weightshifting on balance pad: medial/lateral x2 min 4" step-up:  forward and lateral x10 each  Lt SLR x 20 Seated HS stretch L  2x30 sec    01/07/23 Nustep L2 x 6 min- legs only.  Pt present to discuss progress  Standing rockerboard: x3 minutes  Weightshifting on balance pad: medial/lateral x2 min 4" step-up:  forward and lateral x10 each  Lt SLR x 20 Seated HS stretch L  2x30 sec Manual: Skilled palpation and monitoring of soft tissues during DN. STM Lt tibialis anterior  Trigger Point Dry-Needling  Treatment instructions: Expect mild to moderate muscle soreness. S/S of pneumothorax if dry needled over a lung field, and to seek immediate medical attention should they occur. Patient verbalized understanding of these instructions and education. Patient Consent Given: Yes Education handout provided: Previously provided Muscles treated:Lt ant tib and peroneals Treatment response/outcome: Twitch Response Elicited and Palpable Increase in Muscle Length  01/05/23 Nustep L5 x 5 min L SLR x 20 L SLR with ER x 20 L SAQ x 20 5 sec hold Supine HS stretch L  2x30 sec Prone quad stretch L 2x30 sec  Manual: Skilled palpation and monitoring of soft tissues during DN. STM to L quadriceps. Trigger Point Dry-Needling  Treatment instructions: Expect mild to moderate muscle soreness. S/S of pneumothorax if dry needled over a lung field, and to seek immediate medical attention should they occur. Patient verbalized understanding of these instructions and education. Patient Consent Given: Yes Education handout provided: Previously provided Muscles treated: L quads (VLO, RF and  VI) Electrical stimulation performed: Yes Parameters: 2-5 mA x 5 min, then microcurrent x 5 min Treatment response/outcome: Twitch Response Elicited and Palpable Increase in Muscle Length   12/24/22 See pt ed and HEP Manual: Skilled palpation and monitoring of soft tissues during DN. STM to L ant tibialis and peroneals.  Trigger Point Dry-Needling  Treatment instructions: Expect mild to moderate muscle soreness. S/S of pneumothorax if dry needled  over a lung field, and to seek immediate medical attention should they occur. Patient verbalized understanding of these instructions and education. Patient Consent Given: Yes Education handout provided: Previously provided Muscles treated: L ant tib and peroneals Electrical stimulation performed: No Parameters: N/A Treatment response/outcome: Twitch Response Elicited and Palpable Increase in Muscle Length   PATIENT EDUCATION:  Education details: PT eval findings, anticipated POC, initial HEP, and DN rational, procedure, outcomes, potential side effects, and recommended post-treatment exercises/activity  Person educated: Patient Education method: Explanation, Demonstration, and Handouts Education comprehension: verbalized understanding and returned demonstration  HOME EXERCISE PROGRAM: Access Code: [email or web address] URL: [email or web address] Date: 01/05/2023 Prepared by: Raynelle Fanning  Exercises - Supine Quad Set  - 1 x daily - 7 x weekly - 2 sets - 10 reps - 5 sec hold - Active Straight Leg Raise with Quad Set  - 1 x daily - 7 x weekly - 2 sets - 10 reps - Seated Long Arc Quad  - 1 x daily - 7 x weekly - 2 sets - 10 reps - 5 sec hold - Supine Knee Extension Strengthening  - 1 x daily - 7 x weekly - 2 sets - 10 reps - 5 sec hold - Seated Ankle Plantarflexion Dorsiflexion PROM  - 2 x daily - 7 x weekly - 1 sets - 3 reps - 30-60 sec hold - Supine Hamstring Stretch  - 2 x daily - 7 x weekly - 1 sets - 3 reps - 30 sec hold - Straight Leg Raise  with External Rotation  - 1 x daily - 7 x weekly - 2 sets - 10 reps  ASSESSMENT:  CLINICAL IMPRESSION: ***   OBJECTIVE IMPAIRMENTS: decreased activity tolerance, decreased ROM, decreased strength, increased muscle spasms, impaired flexibility, and pain.   ACTIVITY LIMITATIONS: sitting, standing, and locomotion level  PARTICIPATION LIMITATIONS: occupation  PERSONAL FACTORS: 1 comorbidity: DM  are also affecting patient's functional outcome.   REHAB POTENTIAL: Excellent  CLINICAL DECISION MAKING: Evolving/moderate complexity  EVALUATION COMPLEXITY: Low   SHORT TERM GOALS: Target date: 01/14/2023    Ind with initial HEP Baseline: Goal status: MET  2.  Decreased knee pain by 25%. Baseline:  Goal status: INITIAL   LONG TERM GOALS: Target date: 02/04/2023   Ind with advanced HEP and its progression Baseline:  Goal status: INITIAL  2. Pt able to sit and stand without knee pain to allow return to full duty at work Baseline:  Goal status: INITIAL  3.  Improved LE strength to 5/5 to improve function Baseline:  Goal status: INITIAL  4.  Able to climb stairs without pain Baseline:  Goal status: INITIAL  5.   Improved LEFS to 51 showing functional improvement Baseline:  Goal status:INITIAL   PLAN:  PT FREQUENCY: 2x/week  PT DURATION: 6 weeks  PLANNED INTERVENTIONS: 97164- PT Re-evaluation, 97110-Therapeutic exercises, 97530- Therapeutic activity, 97112- Neuromuscular re-education, 97535- Self Care, 81191- Manual therapy, [id number]- Gait training, 97014- Electrical stimulation (unattended), 97035- Ultrasound, 47829- Ionotophoresis 4mg /ml Dexamethasone, Patient/Family education, Balance training, Stair training, Taping, Dry Needling, Joint mobilization, Spinal mobilization, Cryotherapy, and Moist heat  PLAN FOR NEXT SESSION: Assess response to DN and continue to lateral quads or distal leg, add HS stretches to HEP, [employer], PT 01/11/23 4:38 PM

## 2023-01-12 ENCOUNTER — Ambulatory Visit: Payer: PRIVATE HEALTH INSURANCE | Admitting: Physical Therapy

## 2023-01-12 ENCOUNTER — Encounter: Payer: Self-pay | Admitting: Physical Therapy

## 2023-01-12 DIAGNOSIS — M25562 Pain in left knee: Secondary | ICD-10-CM

## 2023-01-12 DIAGNOSIS — R252 Cramp and spasm: Secondary | ICD-10-CM

## 2023-01-12 DIAGNOSIS — M6281 Muscle weakness (generalized): Secondary | ICD-10-CM

## 2023-01-14 ENCOUNTER — Ambulatory Visit: Payer: PRIVATE HEALTH INSURANCE

## 2023-01-14 DIAGNOSIS — M25562 Pain in left knee: Secondary | ICD-10-CM

## 2023-01-14 DIAGNOSIS — M6281 Muscle weakness (generalized): Secondary | ICD-10-CM

## 2023-01-14 DIAGNOSIS — R252 Cramp and spasm: Secondary | ICD-10-CM

## 2023-01-14 NOTE — Therapy (Signed)
OUTPATIENT PHYSICAL THERAPY TREATMENT   Patient Name: Jasmine Huerta MRN: [id number] DOB:1985-04-02, 37 y.o., female Today's Date: 01/14/2023  END OF SESSION:  PT End of Session - 01/14/23 0912     Visit Number 5    Date for PT Re-Evaluation 02/04/23    Authorization Type [facility]- Workers comp approved 7 visits    Authorization - Visit Number 5    Authorization - Number of Visits 7    PT Start Time 0848    PT Stop Time 0928    PT Time Calculation (min) 40 min    Activity Tolerance Patient tolerated treatment well    Behavior During Therapy WFL for tasks assessed/performed                 Past Medical History:  Diagnosis Date   Anxiety    Arthritis    "My Dr said I do , I dont know."   Bipolar disorder (HCC) age 37   Depression    GDM (gestational diabetes mellitus)    history of GDM   GERD (gastroesophageal reflux disease)    Headache    migraines - 07/02/17-  none recently   Pre-diabetes    Past Surgical History:  Procedure Laterality Date   CHOLECYSTECTOMY N/A 07/05/2017   Procedure: LAPAROSCOPIC CHOLECYSTECTOMY;  Surgeon: Berna Bue, MD;  Location: [facility];  Service: General;  Laterality: N/A;   TUBAL LIGATION  09/17/2011   Procedure: POST PARTUM TUBAL LIGATION;  Surgeon: Purcell Nails, MD;  Location: [facility];  Service: Gynecology;  Laterality: Bilateral;   WISDOM TOOTH EXTRACTION     Patient Active Problem List   Diagnosis Date Noted   Hyperlipidemia associated with type 2 diabetes mellitus (HCC) 12/03/2021   Vitamin D deficiency 12/03/2021   DM (diabetes mellitus), type 2 (HCC) 06/07/2018   NSVD (normal spontaneous vaginal delivery) 09/16/2011   Noncompliance with medications 08/25/2011   Noncompliance of patient with dietary regimen 08/25/2011   GDM (gestational diabetes mellitus) 08/25/2011   Gestational diabetes 07/16/2011   H/O gestational diabetes in prior pregnancy, currently pregnant 06/10/2011    PCP: Philip Aspen, Limmie Patricia,  MD   REFERRING PROVIDER: Sheral Apley, MD   REFERRING DIAG: [phone] (ICD-10-CM) - Left knee pain   THERAPY DIAG:  Acute pain of left knee  Cramp and spasm  Muscle weakness (generalized)  Rationale for Evaluation and Treatment: Rehabilitation  ONSET DATE: 11/18/22  SUBJECTIVE:   SUBJECTIVE STATEMENT: I am feeling better today.  The core exercises worked to reduce my pain last session.  My pain is intermittent now and 80% less frequent.    PERTINENT HISTORY: DM, ADHD  PAIN: 01/14/23:  Are you having pain? Yes: NPRS scale: 0-8/10 Pain location: below Lt knee Pain description: quad tightness, burning/tingling in knee Aggravating factors: prolonged sitting x 30 min, driving,  prolonged walking/standing for prolonged periods  Relieving factors: elevate, ice or heat  PRECAUTIONS: None  RED FLAGS: None   WEIGHT BEARING RESTRICTIONS: No  FALLS:  Has patient fallen in last 6 months? Yes. Number of falls 1   LIVING ENVIRONMENT: Lives with: lives with their spouse Lives in: House/apartment Stairs: Yes: Internal: 13 steps; on left going up and External: 1 steps; none Has following equipment at home: None  OCCUPATION: Nurse tech  PLOF: Independent  PATIENT GOALS: I want to feel normal  NEXT MD VISIT: 01/15/23  OBJECTIVE:  Note: Objective measures were completed at Evaluation unless otherwise noted.  DIAGNOSTIC FINDINGS: XR - mild OA  PATIENT SURVEYS:  LEFS 42 / 80 = 52.5 %  COGNITION: Overall cognitive status: Within functional limits for tasks assessed     SENSATION: WFL   MUSCLE LENGTH: Tight HS B  PALPATION: Increased tone and trigger points in L ant tib, peroneals and distal lateral quads B patellar mobility hypermobile  LOWER EXTREMITY ROM:  Active ROM Right eval Left eval  Hip flexion    Hip extension    Hip abduction    Hip adduction    Hip internal rotation    Hip external rotation    Knee flexion 124 120  Knee extension hyper  ext  hyper ext   Ankle dorsiflexion    Ankle plantarflexion    Ankle inversion    Ankle eversion     (Blank rows = not tested)  LOWER EXTREMITY MMT: *pain  MMT Right eval Left eval  Hip flexion 4+ 4+* pain in quad  Hip extension 5 5  Hip abduction 5 5  Hip adduction    Hip internal rotation    Hip external rotation    Knee flexion    Knee extension 5 5  Ankle dorsiflexion 5 5* pain in quad  Ankle plantarflexion    Ankle inversion    Ankle eversion     (Blank rows = not tested)  LOWER EXTREMITY SPECIAL TESTS:  Knee special tests: Anterior drawer test: negative, Posterior drawer test: negative, Apley's compression test: negative, Apley's distraction test: negative, and varus/valgus stress - negative  GAIT: WNL  TODAY'S TREATMENT:     DATE:   01/14/23 Nustep L5 x 6 min- minimal to no Lt shin pain with this today.  Prone press ups 2x10 Leg press: seat 5 60# bil legs 2x10, single leg: 50# Rt, 30# Lt 2x10 bil each  Standing rockerboard: x3 minutes  Sidelying clam with TA activation  Prone hip extension 2x10 bil  Lt SLR 2x 10 with TA contraction and cues to count out loud to normalize breath. Sit to stand with staggered stance: added 5# kettle bell 2x10 bil each  01/12/23 Nustep L5 x 4 min - stopped due to increased pain in L shin.   Prone press ups 2x10 Some pain with PA mobs at L3/4 B Prone hip ext x 10 B Quadriped alt legs x 10 B Plank 5 x max hold  Standing rockerboard: x3 minutes  Side plank one each on knees for form check Supine sequential march x 5 ea side - cues for correct breathing Lt SLR 2x 10 with TA contraction and cues to count out loud to normalize breath. Functional squat x 10 - good form reports some B knee irritation    01/07/23 Nustep L5 x 4 min- legs only.  Pt present to discuss progress  Standing rockerboard: x3 minutes  Weightshifting on balance pad: medial/lateral x2 min 4" step-up:  forward and lateral x10 each  Lt SLR x 20 Seated HS  stretch L  2x30 sec Manual: Skilled palpation and monitoring of soft tissues during DN. STM Lt tibialis anterior   Trigger Point Dry-Needling  Treatment instructions: Expect mild to moderate muscle soreness. S/S of pneumothorax if dry needled over a lung field, and to seek immediate medical attention should they occur. Patient verbalized understanding of these instructions and education. Patient Consent Given: Yes Education handout provided: Previously provided Muscles treated:Lt ant tib and peroneals Treatment response/outcome: Twitch Response Elicited and Palpable Increase in Muscle Length  PATIENT EDUCATION:  Education details: PT eval findings, anticipated POC, initial HEP, and  DN rational, procedure, outcomes, potential side effects, and recommended post-treatment exercises/activity  Person educated: Patient Education method: Explanation, Demonstration, and Handouts Education comprehension: verbalized understanding and returned demonstration  HOME EXERCISE PROGRAM: Access Code: [email or web address] URL: [email or web address] Date: 01/05/2023 Prepared by: Raynelle Fanning  Exercises - Supine Quad Set  - 1 x daily - 7 x weekly - 2 sets - 10 reps - 5 sec hold - Active Straight Leg Raise with Quad Set  - 1 x daily - 7 x weekly - 2 sets - 10 reps - Seated Long Arc Quad  - 1 x daily - 7 x weekly - 2 sets - 10 reps - 5 sec hold - Supine Knee Extension Strengthening  - 1 x daily - 7 x weekly - 2 sets - 10 reps - 5 sec hold - Seated Ankle Plantarflexion Dorsiflexion PROM  - 2 x daily - 7 x weekly - 1 sets - 3 reps - 30-60 sec hold - Supine Hamstring Stretch  - 2 x daily - 7 x weekly - 1 sets - 3 reps - 30 sec hold - Straight Leg Raise with External Rotation  - 1 x daily - 7 x weekly - 2 sets - 10 reps  ASSESSMENT:  CLINICAL IMPRESSION: Pt had reduction of Lt LE/shin pain after focusing on core last session, suggesting that this pain might be related to her low back/radiculopathy.  Pt was able to  tolerate NuStep today with minimal shin pain. Pt reports pain down the Lt leg to the shin with sitting and standing/walking long periods. Pt is limited with this in her daily tasks and work activities.   Pt reports 80% reduction in frequency of pain and pain still increased to 8/10 with aggravating tasks.  Pt did tolerated activity in the clinic today without significant pain increase.  PT monitored throughout session for technique and pain. Patient will benefit from skilled PT to address the below impairments and improve overall function.   OBJECTIVE IMPAIRMENTS: decreased activity tolerance, decreased ROM, decreased strength, increased muscle spasms, impaired flexibility, and pain.   ACTIVITY LIMITATIONS: sitting, standing, and locomotion level  PARTICIPATION LIMITATIONS: occupation  PERSONAL FACTORS: 1 comorbidity: DM  are also affecting patient's functional outcome.   REHAB POTENTIAL: Excellent  CLINICAL DECISION MAKING: Evolving/moderate complexity  EVALUATION COMPLEXITY: Low   SHORT TERM GOALS: Target date: 01/14/2023    Ind with initial HEP Baseline: Goal status: MET  2.  Decreased knee pain by 25%. Baseline: intermittent by 80%, still same intensity (01/14/23) Goal status: met   LONG TERM GOALS: Target date: 02/04/2023   Ind with advanced HEP and its progression Baseline:  Goal status: INITIAL  2. Pt able to sit and stand without knee pain to allow return to full duty at work Baseline: pain with light duty standing (01/14/23) Goal status: In progress   3.  Improved LE strength to 5/5 to improve function Baseline:  Goal status: INITIAL  4.  Able to climb stairs without pain Baseline:  Goal status: INITIAL  5.   Improved LEFS to 51 showing functional improvement Baseline:  Goal status:INITIAL   PLAN:  PT FREQUENCY: 2x/week  PT DURATION: 6 weeks  PLANNED INTERVENTIONS: 97164- PT Re-evaluation, 97110-Therapeutic exercises, 97530- Therapeutic activity, 97112-  Neuromuscular re-education, 97535- Self Care, 78295- Manual therapy, [id number]- Gait training, 97014- Electrical stimulation (unattended), 97035- Ultrasound, 62130- Ionotophoresis 4mg /ml Dexamethasone, Patient/Family education, Balance training, Stair training, Taping, Dry Needling, Joint mobilization, Spinal mobilization, Cryotherapy, and Moist heat  PLAN FOR NEXT SESSION: Assess response to  press ups; work functional core strength, extension biased TE; DN prn.  See how MD visit goes    Lorrene Reid, PT 01/14/23 9:30 AM

## 2023-01-19 ENCOUNTER — Ambulatory Visit: Payer: PRIVATE HEALTH INSURANCE | Attending: Orthopedic Surgery

## 2023-01-19 DIAGNOSIS — M25562 Pain in left knee: Secondary | ICD-10-CM | POA: Insufficient documentation

## 2023-01-19 DIAGNOSIS — M6281 Muscle weakness (generalized): Secondary | ICD-10-CM | POA: Insufficient documentation

## 2023-01-19 DIAGNOSIS — R252 Cramp and spasm: Secondary | ICD-10-CM | POA: Insufficient documentation

## 2023-01-19 NOTE — Therapy (Signed)
OUTPATIENT PHYSICAL THERAPY TREATMENT   Patient Name: Jasmine Huerta MRN: [id number] DOB:[birth year], 37 y.o., female Today's Date: 01/19/2023  END OF SESSION:  PT End of Session - 01/19/23 0850     Visit Number 6    Date for PT Re-Evaluation 02/04/23    Authorization Type [facility]- Workers comp approved 13 visits    Authorization - Visit Number 6    Authorization - Number of Visits 13    PT Start Time 0802    PT Stop Time [phone]    PT Time Calculation (min) 41 min    Activity Tolerance Patient tolerated treatment well    Behavior During Therapy WFL for tasks assessed/performed                  Past Medical History:  Diagnosis Date   Anxiety    Arthritis    "My Dr said I do , I dont know."   Bipolar disorder (HCC) age 57   Depression    GDM (gestational diabetes mellitus)    history of GDM   GERD (gastroesophageal reflux disease)    Headache    migraines - 07/02/17-  none recently   Pre-diabetes    Past Surgical History:  Procedure Laterality Date   CHOLECYSTECTOMY N/A 07/05/2017   Procedure: LAPAROSCOPIC CHOLECYSTECTOMY;  Surgeon: Berna Bue, MD;  Location: [facility];  Service: General;  Laterality: N/A;   TUBAL LIGATION  09/17/2011   Procedure: POST PARTUM TUBAL LIGATION;  Surgeon: Purcell Nails, MD;  Location: [facility];  Service: Gynecology;  Laterality: Bilateral;   WISDOM TOOTH EXTRACTION     Patient Active Problem List   Diagnosis Date Noted   Hyperlipidemia associated with type 2 diabetes mellitus (HCC) 12/03/2021   Vitamin D deficiency 12/03/2021   DM (diabetes mellitus), type 2 (HCC) 06/07/2018   NSVD (normal spontaneous vaginal delivery) 09/16/2011   Noncompliance with medications 08/25/2011   Noncompliance of patient with dietary regimen 08/25/2011   GDM (gestational diabetes mellitus) 08/25/2011   Gestational diabetes 07/16/2011   H/O gestational diabetes in prior pregnancy, currently pregnant 06/10/2011    PCP: Philip Aspen, Limmie Patricia,  MD   REFERRING PROVIDER: Sheral Apley, MD   REFERRING DIAG: [phone] (ICD-10-CM) - Left knee pain   THERAPY DIAG:  Acute pain of left knee  Cramp and spasm  Muscle weakness (generalized)  Rationale for Evaluation and Treatment: Rehabilitation  ONSET DATE: 11/18/22  SUBJECTIVE:   SUBJECTIVE STATEMENT: Saw the PA last week.  Things are improving with the Lt knee.  No pain today.    PERTINENT HISTORY: DM, ADHD  PAIN: 01/19/23:  Are you having pain? Yes: NPRS scale: 0-6/10 Pain location: below Lt knee Pain description: quad tightness, burning/tingling in knee Aggravating factors: prolonged sitting x 30 min, driving,  prolonged walking/standing for prolonged periods  Relieving factors: elevate, ice or heat  PRECAUTIONS: None  RED FLAGS: None   WEIGHT BEARING RESTRICTIONS: No  FALLS:  Has patient fallen in last 6 months? Yes. Number of falls 1   LIVING ENVIRONMENT: Lives with: lives with their spouse Lives in: House/apartment Stairs: Yes: Internal: 13 steps; on left going up and External: 1 steps; none Has following equipment at home: None  OCCUPATION: Nurse tech  PLOF: Independent  PATIENT GOALS: I want to feel normal  NEXT MD VISIT: 01/15/23  OBJECTIVE:  Note: Objective measures were completed at Evaluation unless otherwise noted.  DIAGNOSTIC FINDINGS: XR - mild OA  PATIENT SURVEYS:  LEFS 42 / 80 =  52.5 %  COGNITION: Overall cognitive status: Within functional limits for tasks assessed     SENSATION: WFL   MUSCLE LENGTH: Tight HS B  PALPATION: Increased tone and trigger points in L ant tib, peroneals and distal lateral quads B patellar mobility hypermobile  LOWER EXTREMITY ROM:  Active ROM Right eval Left eval  Hip flexion    Hip extension    Hip abduction    Hip adduction    Hip internal rotation    Hip external rotation    Knee flexion 124 120  Knee extension hyper ext  hyper ext   Ankle dorsiflexion    Ankle  plantarflexion    Ankle inversion    Ankle eversion     (Blank rows = not tested)  LOWER EXTREMITY MMT: *pain  MMT Right eval Left eval  Hip flexion 4+ 4+* pain in quad  Hip extension 5 5  Hip abduction 5 5  Hip adduction    Hip internal rotation    Hip external rotation    Knee flexion    Knee extension 5 5  Ankle dorsiflexion 5 5* pain in quad  Ankle plantarflexion    Ankle inversion    Ankle eversion     (Blank rows = not tested)  LOWER EXTREMITY SPECIAL TESTS:  Knee special tests: Anterior drawer test: negative, Posterior drawer test: negative, Apley's compression test: negative, Apley's distraction test: negative, and varus/valgus stress - negative  GAIT: WNL  TODAY'S TREATMENT:     DATE:   01/19/23 Elliptical: Level 4, ramp 4x 5 min-PT present to discuss progress  Prone press ups 2x10 Leg press: seat 5 60# bil legs 2x10, single leg: 60# Rt, 50# Lt 2x10 bil each  Standing rockerboard: x3 minutes  Seated hamstring stretch 3x20 seconds  Bosu ball step ups: Lt and Rt 2x10 Sit to stand with staggered stance: added 5# kettle bell 2x10 bil each Star lunges with slider under Rt foot x10   01/14/23 Nustep L5 x 6 min- minimal to no Lt shin pain with this today.  Prone press ups 2x10 Leg press: seat 5 60# bil legs 2x10, single leg: 50# Rt, 30# Lt 2x10 bil each  Standing rockerboard: x3 minutes  Sidelying clam with TA activation  Prone hip extension 2x10 bil  Lt SLR 2x 10 with TA contraction and cues to count out loud to normalize breath. Sit to stand with staggered stance: added 5# kettle bell 2x10 bil each  01/12/23 Nustep L5 x 4 min - stopped due to increased pain in L shin.   Prone press ups 2x10 Some pain with PA mobs at L3/4 B Prone hip ext x 10 B Quadriped alt legs x 10 B Plank 5 x max hold  Standing rockerboard: x3 minutes  Side plank one each on knees for form check Supine sequential march x 5 ea side - cues for correct breathing Lt SLR 2x 10 with TA  contraction and cues to count out loud to normalize breath. Functional squat x 10 - good form reports some B knee irritation   PATIENT EDUCATION:  Education details: PT eval findings, anticipated POC, initial HEP, and DN rational, procedure, outcomes, potential side effects, and recommended post-treatment exercises/activity  Person educated: Patient Education method: Explanation, Demonstration, and Handouts Education comprehension: verbalized understanding and returned demonstration  HOME EXERCISE PROGRAM: Access Code: [email or web address] URL: [email or web address] Date: 01/05/2023 Prepared by: Raynelle Fanning  Exercises - Supine Quad Set  - 1 x daily - 7 x weekly - 2 sets -  10 reps - 5 sec hold - Active Straight Leg Raise with Quad Set  - 1 x daily - 7 x weekly - 2 sets - 10 reps - Seated Long Arc Quad  - 1 x daily - 7 x weekly - 2 sets - 10 reps - 5 sec hold - Supine Knee Extension Strengthening  - 1 x daily - 7 x weekly - 2 sets - 10 reps - 5 sec hold - Seated Ankle Plantarflexion Dorsiflexion PROM  - 2 x daily - 7 x weekly - 1 sets - 3 reps - 30-60 sec hold - Supine Hamstring Stretch  - 2 x daily - 7 x weekly - 1 sets - 3 reps - 30 sec hold - Straight Leg Raise with External Rotation  - 1 x daily - 7 x weekly - 2 sets - 10 reps  ASSESSMENT:  CLINICAL IMPRESSION: Pt saw MD last week and wants her to keep progressing with PT.  Her symptoms were low throughout session today and she tolerated advanced proprioceptive exercises and added elliptical today.  PT monitored throughout session for technique and pain. Patient will benefit from skilled PT to address the below impairments and improve overall function.   OBJECTIVE IMPAIRMENTS: decreased activity tolerance, decreased ROM, decreased strength, increased muscle spasms, impaired flexibility, and pain.   ACTIVITY LIMITATIONS: sitting, standing, and locomotion level  PARTICIPATION LIMITATIONS: occupation  PERSONAL FACTORS: 1 comorbidity:  DM  are also affecting patient's functional outcome.   REHAB POTENTIAL: Excellent  CLINICAL DECISION MAKING: Evolving/moderate complexity  EVALUATION COMPLEXITY: Low   SHORT TERM GOALS: Target date: 01/14/2023    Ind with initial HEP Baseline: Goal status: MET  2.  Decreased knee pain by 25%. Baseline: intermittent by 80%, still same intensity (01/14/23) Goal status: met   LONG TERM GOALS: Target date: 02/04/2023   Ind with advanced HEP and its progression Baseline:  Goal status: INITIAL  2. Pt able to sit and stand without knee pain to allow return to full duty at work Baseline: pain with light duty standing (01/14/23) Goal status: In progress   3.  Improved LE strength to 5/5 to improve function Baseline:  Goal status: INITIAL  4.  Able to climb stairs without pain Baseline:  Goal status: INITIAL  5.   Improved LEFS to 51 showing functional improvement Baseline:  Goal status:INITIAL   PLAN:  PT FREQUENCY: 2x/week  PT DURATION: 6 weeks  PLANNED INTERVENTIONS: 97164- PT Re-evaluation, 97110-Therapeutic exercises, 97530- Therapeutic activity, 97112- Neuromuscular re-education, 97535- Self Care, 36644- Manual therapy, [id number]- Gait training, 97014- Electrical stimulation (unattended), 97035- Ultrasound, [id number]- Ionotophoresis 4mg /ml Dexamethasone, Patient/Family education, Balance training, Stair training, Taping, Dry Needling, Joint mobilization, Spinal mobilization, Cryotherapy, and Moist heat  PLAN FOR NEXT SESSION: Assess response to press ups; work functional core strength, extension biased TE; DN prn.      Lorrene Reid, PT 01/19/23 8:51 AM

## 2023-01-21 ENCOUNTER — Ambulatory Visit: Payer: PRIVATE HEALTH INSURANCE

## 2023-01-21 DIAGNOSIS — M25562 Pain in left knee: Secondary | ICD-10-CM

## 2023-01-21 DIAGNOSIS — R252 Cramp and spasm: Secondary | ICD-10-CM

## 2023-01-21 DIAGNOSIS — M6281 Muscle weakness (generalized): Secondary | ICD-10-CM

## 2023-01-21 NOTE — Therapy (Signed)
OUTPATIENT PHYSICAL THERAPY TREATMENT   Patient Name: Jasmine Huerta MRN: [id number] DOB:04-15-1985, 37 y.o., female Today's Date: 01/21/2023  END OF SESSION:  PT End of Session - 01/21/23 1309     Visit Number 7    Date for PT Re-Evaluation 02/04/23    Authorization Type [facility]- Workers comp approved 13 visits    Authorization - Visit Number 7    Authorization - Number of Visits 13    PT Start Time 1231    PT Stop Time 1310    PT Time Calculation (min) 39 min    Activity Tolerance Patient tolerated treatment well    Behavior During Therapy WFL for tasks assessed/performed                   Past Medical History:  Diagnosis Date   Anxiety    Arthritis    "My Dr said I do , I dont know."   Bipolar disorder (HCC) age 56   Depression    GDM (gestational diabetes mellitus)    history of GDM   GERD (gastroesophageal reflux disease)    Headache    migraines - 07/02/17-  none recently   Pre-diabetes    Past Surgical History:  Procedure Laterality Date   CHOLECYSTECTOMY N/A 07/05/2017   Procedure: LAPAROSCOPIC CHOLECYSTECTOMY;  Surgeon: Berna Bue, MD;  Location: [facility];  Service: General;  Laterality: N/A;   TUBAL LIGATION  09/17/2011   Procedure: POST PARTUM TUBAL LIGATION;  Surgeon: Purcell Nails, MD;  Location: [facility];  Service: Gynecology;  Laterality: Bilateral;   WISDOM TOOTH EXTRACTION     Patient Active Problem List   Diagnosis Date Noted   Hyperlipidemia associated with type 2 diabetes mellitus (HCC) 12/03/2021   Vitamin D deficiency 12/03/2021   DM (diabetes mellitus), type 2 (HCC) 06/07/2018   NSVD (normal spontaneous vaginal delivery) 09/16/2011   Noncompliance with medications 08/25/2011   Noncompliance of patient with dietary regimen 08/25/2011   GDM (gestational diabetes mellitus) 08/25/2011   Gestational diabetes 07/16/2011   H/O gestational diabetes in prior pregnancy, currently pregnant 06/10/2011    PCP: Philip Aspen, Limmie Patricia,  MD   REFERRING PROVIDER: Sheral Apley, MD   REFERRING DIAG: [phone] (ICD-10-CM) - Left knee pain   THERAPY DIAG:  Acute pain of left knee  Cramp and spasm  Muscle weakness (generalized)  Rationale for Evaluation and Treatment: Rehabilitation  ONSET DATE: 11/18/22  SUBJECTIVE:   SUBJECTIVE STATEMENT: I had some pain with leaving work today.  PERTINENT HISTORY: DM, ADHD  PAIN: 01/19/23:  Are you having pain? Yes: NPRS scale: 0-6/10 Pain location: below Lt knee Pain description: quad tightness, burning/tingling in knee Aggravating factors: prolonged sitting x 30 min, driving,  prolonged walking/standing for prolonged periods  Relieving factors: elevate, ice or heat  PRECAUTIONS: None  RED FLAGS: None   WEIGHT BEARING RESTRICTIONS: No  FALLS:  Has patient fallen in last 6 months? Yes. Number of falls 1   LIVING ENVIRONMENT: Lives with: lives with their spouse Lives in: House/apartment Stairs: Yes: Internal: 13 steps; on left going up and External: 1 steps; none Has following equipment at home: None  OCCUPATION: Nurse tech  PLOF: Independent  PATIENT GOALS: I want to feel normal  NEXT MD VISIT: 01/15/23  OBJECTIVE:  Note: Objective measures were completed at Evaluation unless otherwise noted.  DIAGNOSTIC FINDINGS: XR - mild OA  PATIENT SURVEYS:  LEFS 42 / 80 = 52.5 %  COGNITION: Overall cognitive status: Within functional  limits for tasks assessed     SENSATION: WFL   MUSCLE LENGTH: Tight HS B  PALPATION: Increased tone and trigger points in L ant tib, peroneals and distal lateral quads B patellar mobility hypermobile  LOWER EXTREMITY ROM:  Active ROM Right eval Left eval  Hip flexion    Hip extension    Hip abduction    Hip adduction    Hip internal rotation    Hip external rotation    Knee flexion 124 120  Knee extension hyper ext  hyper ext   Ankle dorsiflexion    Ankle plantarflexion    Ankle inversion    Ankle  eversion     (Blank rows = not tested)  LOWER EXTREMITY MMT: *pain  MMT Right eval Left eval  Hip flexion 4+ 4+* pain in quad  Hip extension 5 5  Hip abduction 5 5  Hip adduction    Hip internal rotation    Hip external rotation    Knee flexion    Knee extension 5 5  Ankle dorsiflexion 5 5* pain in quad  Ankle plantarflexion    Ankle inversion    Ankle eversion     (Blank rows = not tested)  LOWER EXTREMITY SPECIAL TESTS:  Knee special tests: Anterior drawer test: negative, Posterior drawer test: negative, Apley's compression test: negative, Apley's distraction test: negative, and varus/valgus stress - negative  GAIT: WNL  TODAY'S TREATMENT:     DATE:   01/21/23 NuStep: level 4x 6 min- legs only.  PT present to discuss progress  Leg press: seat 5 80# bil legs 2x10, single leg: 60# Rt, 60# Lt 2x10 bil each  Standing rockerboard: x3 minutes  Seated hamstring stretch 3x20 seconds  Bosu ball step ups: Lt and Rt 2x10 Sit to stand with staggered stance: added 5# kettle bell 2x10 bil each Resisted walking: 4 ways 15# forward and reverse, 10# sidestepping x5 each  01/19/23 Elliptical: Level 4, ramp 4x 5 min-PT present to discuss progress  Prone press ups 2x10 Leg press: seat 5 60# bil legs 2x10, single leg: 60# Rt, 50# Lt 2x10 bil each  Standing rockerboard: x3 minutes  Seated hamstring stretch 3x20 seconds  Bosu ball step ups: Lt and Rt 2x10 Sit to stand with staggered stance: added 5# kettle bell 2x10 bil each Star lunges with slider under Rt foot x10   01/14/23 Nustep L5 x 6 min- minimal to no Lt shin pain with this today.  Prone press ups 2x10 Leg press: seat 5 60# bil legs 2x10, single leg: 50# Rt, 30# Lt 2x10 bil each  Standing rockerboard: x3 minutes  Sidelying clam with TA activation  Prone hip extension 2x10 bil  Lt SLR 2x 10 with TA contraction and cues to count out loud to normalize breath. Sit to stand with staggered stance: added 5# kettle bell 2x10 bil  each     PATIENT EDUCATION:  Education details: PT eval findings, anticipated POC, initial HEP, and DN rational, procedure, outcomes, potential side effects, and recommended post-treatment exercises/activity  Person educated: Patient Education method: Explanation, Demonstration, and Handouts Education comprehension: verbalized understanding and returned demonstration  HOME EXERCISE PROGRAM: Access Code: [email or web address] URL: [email or web address] Date: 01/05/2023 Prepared by: Raynelle Fanning  Exercises - Supine Quad Set  - 1 x daily - 7 x weekly - 2 sets - 10 reps - 5 sec hold - Active Straight Leg Raise with Quad Set  - 1 x daily - 7 x weekly - 2 sets - 10 reps - Seated  Long Arc Quad  - 1 x daily - 7 x weekly - 2 sets - 10 reps - 5 sec hold - Supine Knee Extension Strengthening  - 1 x daily - 7 x weekly - 2 sets - 10 reps - 5 sec hold - Seated Ankle Plantarflexion Dorsiflexion PROM  - 2 x daily - 7 x weekly - 1 sets - 3 reps - 30-60 sec hold - Supine Hamstring Stretch  - 2 x daily - 7 x weekly - 1 sets - 3 reps - 30 sec hold - Straight Leg Raise with External Rotation  - 1 x daily - 7 x weekly - 2 sets - 10 reps  ASSESSMENT:  CLINICAL IMPRESSION: Pt arrived with increased Lt knee pain today that reduced with increased activity.  Pt did increased weight on the leg press today with bil legs and Lt LE.   PT monitored throughout session for technique and pain. Patient will benefit from skilled PT to address the below impairments and improve overall function.   OBJECTIVE IMPAIRMENTS: decreased activity tolerance, decreased ROM, decreased strength, increased muscle spasms, impaired flexibility, and pain.   ACTIVITY LIMITATIONS: sitting, standing, and locomotion level  PARTICIPATION LIMITATIONS: occupation  PERSONAL FACTORS: 1 comorbidity: DM  are also affecting patient's functional outcome.   REHAB POTENTIAL: Excellent  CLINICAL DECISION MAKING: Evolving/moderate  complexity  EVALUATION COMPLEXITY: Low   SHORT TERM GOALS: Target date: 01/14/2023    Ind with initial HEP Baseline: Goal status: MET  2.  Decreased knee pain by 25%. Baseline: intermittent by 80%, still same intensity (01/14/23) Goal status: met   LONG TERM GOALS: Target date: 02/04/2023   Ind with advanced HEP and its progression Baseline:  Goal status: INITIAL  2. Pt able to sit and stand without knee pain to allow return to full duty at work Baseline: pain with light duty standing (01/14/23) Goal status: In progress   3.  Improved LE strength to 5/5 to improve function Baseline:  Goal status: INITIAL  4.  Able to climb stairs without pain Baseline:  Goal status: INITIAL  5.   Improved LEFS to 51 showing functional improvement Baseline:  Goal status:INITIAL   PLAN:  PT FREQUENCY: 2x/week  PT DURATION: 6 weeks  PLANNED INTERVENTIONS: 97164- PT Re-evaluation, 97110-Therapeutic exercises, 97530- Therapeutic activity, 97112- Neuromuscular re-education, 97535- Self Care, 78295- Manual therapy, [id number]- Gait training, 97014- Electrical stimulation (unattended), 97035- Ultrasound, 62130- Ionotophoresis 4mg /ml Dexamethasone, Patient/Family education, Balance training, Stair training, Taping, Dry Needling, Joint mobilization, Spinal mobilization, Cryotherapy, and Moist heat  PLAN FOR NEXT SESSION: Work on Lt LE stability, strength, flexibility       [facility], PT 01/21/23 1:12 PM

## 2023-01-28 ENCOUNTER — Ambulatory Visit: Payer: PRIVATE HEALTH INSURANCE

## 2023-01-28 DIAGNOSIS — R252 Cramp and spasm: Secondary | ICD-10-CM

## 2023-01-28 DIAGNOSIS — M25562 Pain in left knee: Secondary | ICD-10-CM | POA: Diagnosis not present

## 2023-01-28 DIAGNOSIS — M6281 Muscle weakness (generalized): Secondary | ICD-10-CM

## 2023-01-28 NOTE — Therapy (Signed)
OUTPATIENT PHYSICAL THERAPY TREATMENT   Patient Name: Jasmine Huerta MRN: [id number] DOB:[birth year], 37 y.o., female Today's Date: 01/28/2023  END OF SESSION:  PT End of Session - 01/28/23 0852     Visit Number 8    Date for PT Re-Evaluation 02/04/23    Authorization Type [facility]- Workers comp approved 13 visits    Authorization - Visit Number 8    Authorization - Number of Visits 13    PT Start Time 0801    PT Stop Time 0843    PT Time Calculation (min) 42 min    Activity Tolerance Patient tolerated treatment well    Behavior During Therapy WFL for tasks assessed/performed                    Past Medical History:  Diagnosis Date   Anxiety    Arthritis    "My Dr said I do , I dont know."   Bipolar disorder (HCC) age 82   Depression    GDM (gestational diabetes mellitus)    history of GDM   GERD (gastroesophageal reflux disease)    Headache    migraines - 07/02/17-  none recently   Pre-diabetes    Past Surgical History:  Procedure Laterality Date   CHOLECYSTECTOMY N/A 07/05/2017   Procedure: LAPAROSCOPIC CHOLECYSTECTOMY;  Surgeon: Berna Bue, MD;  Location: [facility];  Service: General;  Laterality: N/A;   TUBAL LIGATION  09/17/2011   Procedure: POST PARTUM TUBAL LIGATION;  Surgeon: Purcell Nails, MD;  Location: [facility];  Service: Gynecology;  Laterality: Bilateral;   WISDOM TOOTH EXTRACTION     Patient Active Problem List   Diagnosis Date Noted   Hyperlipidemia associated with type 2 diabetes mellitus (HCC) 12/03/2021   Vitamin D deficiency 12/03/2021   DM (diabetes mellitus), type 2 (HCC) 06/07/2018   NSVD (normal spontaneous vaginal delivery) 09/16/2011   Noncompliance with medications 08/25/2011   Noncompliance of patient with dietary regimen 08/25/2011   GDM (gestational diabetes mellitus) 08/25/2011   Gestational diabetes 07/16/2011   H/O gestational diabetes in prior pregnancy, currently pregnant 06/10/2011    PCP: Philip Aspen, Limmie Patricia,  MD   REFERRING PROVIDER: Sheral Apley, MD   REFERRING DIAG: [phone] (ICD-10-CM) - Left knee pain   THERAPY DIAG:  Acute pain of left knee  Cramp and spasm  Muscle weakness (generalized)  Rationale for Evaluation and Treatment: Rehabilitation  ONSET DATE: 11/18/22  SUBJECTIVE:   SUBJECTIVE STATEMENT: Both knees were hurting yesterday because I was on my feet a lot.   PERTINENT HISTORY: DM, ADHD  PAIN: 01/28/23:  Are you having pain? Yes: NPRS scale: 0-6/10 Pain location: below Lt knee Pain description: quad tightness, burning/tingling in knee Aggravating factors: prolonged sitting x 30 min, driving,  prolonged walking/standing for prolonged periods  Relieving factors: elevate, ice or heat  PRECAUTIONS: None  RED FLAGS: None   WEIGHT BEARING RESTRICTIONS: No  FALLS:  Has patient fallen in last 6 months? Yes. Number of falls 1   LIVING ENVIRONMENT: Lives with: lives with their spouse Lives in: House/apartment Stairs: Yes: Internal: 13 steps; on left going up and External: 1 steps; none Has following equipment at home: None  OCCUPATION: Nurse tech  PLOF: Independent  PATIENT GOALS: I want to feel normal  NEXT MD VISIT: 01/15/23  OBJECTIVE:  Note: Objective measures were completed at Evaluation unless otherwise noted.  DIAGNOSTIC FINDINGS: XR - mild OA  PATIENT SURVEYS:  LEFS 42 / 80 = 52.5 %  01/28/23: LEFS 53/80  COGNITION: Overall cognitive status: Within functional limits for tasks assessed     SENSATION: WFL   MUSCLE LENGTH: Tight HS B  PALPATION: Increased tone and trigger points in L ant tib, peroneals and distal lateral quads B patellar mobility hypermobile  LOWER EXTREMITY ROM:  Active ROM Right eval Left eval  Hip flexion    Hip extension    Hip abduction    Hip adduction    Hip internal rotation    Hip external rotation    Knee flexion 124 120  Knee extension hyper ext  hyper ext   Ankle dorsiflexion    Ankle  plantarflexion    Ankle inversion    Ankle eversion     (Blank rows = not tested)  LOWER EXTREMITY MMT: *pain  MMT Right eval Left eval  Hip flexion 4+ 4+* pain in quad  Hip extension 5 5  Hip abduction 5 5  Hip adduction    Hip internal rotation    Hip external rotation    Knee flexion    Knee extension 5 5  Ankle dorsiflexion 5 5* pain in quad  Ankle plantarflexion    Ankle inversion    Ankle eversion     (Blank rows = not tested)  LOWER EXTREMITY SPECIAL TESTS:  Knee special tests: Anterior drawer test: negative, Posterior drawer test: negative, Apley's compression test: negative, Apley's distraction test: negative, and varus/valgus stress - negative  GAIT: WNL  TODAY'S TREATMENT:     DATE:   01/28/23 Elliptical: Ramp 4, level 5x 6 minutes Leg press: seat 5 80# bil legs 2x10, single leg: 80# Rt, 50# Lt 2x10 bil each , 60# 2x10 Standing rockerboard: x3 minutes  Seated hamstring stretch 3x20 seconds  Bosu lunges: Lt and Rt 2x10 Sit to stand with staggered stance: added 10# kettle bell 2x10 bil each Walking around building with 10# on Rt and Lt each lap  01/21/23 NuStep: level 4x 6 min- legs only.  PT present to discuss progress  Leg press: seat 5 80# bil legs 2x10, single leg: 60# Rt, 60# Lt 2x10 bil each  Standing rockerboard: x3 minutes  Seated hamstring stretch 3x20 seconds  Bosu ball step ups: Lt and Rt 2x10 Sit to stand with staggered stance: added 5# kettle bell 2x10 bil each Resisted walking: 4 ways 15# forward and reverse, 10# sidestepping x5 each  01/19/23 Elliptical: Level 4, ramp 4x 5 min-PT present to discuss progress  Prone press ups 2x10 Leg press: seat 5 60# bil legs 2x10, single leg: 60# Rt, 50# Lt 2x10 bil each  Standing rockerboard: x3 minutes  Seated hamstring stretch 3x20 seconds  Bosu ball step ups: Lt and Rt 2x10 Sit to stand with staggered stance: added 5# kettle bell 2x10 bil each Star lunges with slider under Rt foot x10    PATIENT  EDUCATION:  Education details: PT eval findings, anticipated POC, initial HEP, and DN rational, procedure, outcomes, potential side effects, and recommended post-treatment exercises/activity  Person educated: Patient Education method: Explanation, Demonstration, and Handouts Education comprehension: verbalized understanding and returned demonstration  HOME EXERCISE PROGRAM: Access Code: [email or web address] URL: [email or web address] Date: 01/05/2023 Prepared by: Raynelle Fanning  Exercises - Supine Quad Set  - 1 x daily - 7 x weekly - 2 sets - 10 reps - 5 sec hold - Active Straight Leg Raise with Quad Set  - 1 x daily - 7 x weekly - 2 sets - 10 reps - Seated Long Arc Quad  - 1 x  daily - 7 x weekly - 2 sets - 10 reps - 5 sec hold - Supine Knee Extension Strengthening  - 1 x daily - 7 x weekly - 2 sets - 10 reps - 5 sec hold - Seated Ankle Plantarflexion Dorsiflexion PROM  - 2 x daily - 7 x weekly - 1 sets - 3 reps - 30-60 sec hold - Supine Hamstring Stretch  - 2 x daily - 7 x weekly - 1 sets - 3 reps - 30 sec hold - Straight Leg Raise with External Rotation  - 1 x daily - 7 x weekly - 2 sets - 10 reps  ASSESSMENT:  CLINICAL IMPRESSION: Pt arrived with increased Lt knee pain today that reduced with increased activity. Pt did well with increased weight with sit to stand.  LEFS score improved to 53/80, indicating improved function.   PT monitored throughout session for technique and pain. Patient will benefit from skilled PT to address the below impairments and improve overall function.   OBJECTIVE IMPAIRMENTS: decreased activity tolerance, decreased ROM, decreased strength, increased muscle spasms, impaired flexibility, and pain.   ACTIVITY LIMITATIONS: sitting, standing, and locomotion level  PARTICIPATION LIMITATIONS: occupation  PERSONAL FACTORS: 1 comorbidity: DM  are also affecting patient's functional outcome.   REHAB POTENTIAL: Excellent  CLINICAL DECISION MAKING: Evolving/moderate  complexity  EVALUATION COMPLEXITY: Low   SHORT TERM GOALS: Target date: 01/14/2023    Ind with initial HEP Baseline: Goal status: MET  2.  Decreased knee pain by 25%. Baseline: intermittent by 80%, still same intensity (01/14/23) Goal status: met   LONG TERM GOALS: Target date: 02/04/2023   Ind with advanced HEP and its progression Baseline:  Goal status: INITIAL  2. Pt able to sit and stand without knee pain to allow return to full duty at work Baseline: pain with light duty standing (01/14/23) Goal status: In progress   3.  Improved LE strength to 5/5 to improve function Baseline:  Goal status: INITIAL  4.  Able to climb stairs without pain Baseline:  Goal status: INITIAL  5.   Improved LEFS to 51 showing functional improvement Baseline: 53 (01/28/23) Goal status:In progress    PLAN:  PT FREQUENCY: 2x/week  PT DURATION: 6 weeks  PLANNED INTERVENTIONS: 97164- PT Re-evaluation, 97110-Therapeutic exercises, 97530- Therapeutic activity, [id number]- Neuromuscular re-education, 97535- Self Care, 16109- Manual therapy, [id number]- Gait training, 97014- Electrical stimulation (unattended), 97035- Ultrasound, 60454- Ionotophoresis 4mg /ml Dexamethasone, Patient/Family education, Balance training, Stair training, Taping, Dry Needling, Joint mobilization, Spinal mobilization, Cryotherapy, and Moist heat  PLAN FOR NEXT SESSION: Work on Lt LE stability, strength, flexibility       [facility], PT 01/28/23 8:53 AM

## 2023-02-01 ENCOUNTER — Ambulatory Visit: Payer: PRIVATE HEALTH INSURANCE

## 2023-02-01 DIAGNOSIS — M25562 Pain in left knee: Secondary | ICD-10-CM

## 2023-02-01 DIAGNOSIS — M6281 Muscle weakness (generalized): Secondary | ICD-10-CM

## 2023-02-01 DIAGNOSIS — R252 Cramp and spasm: Secondary | ICD-10-CM

## 2023-02-01 NOTE — Therapy (Signed)
OUTPATIENT PHYSICAL THERAPY TREATMENT   Patient Name: Jasmine Huerta MRN: [id number] DOB:26-Nov-1985, 37 y.o., female Today's Date: 02/01/2023  END OF SESSION:  PT End of Session - 02/01/23 0846     Visit Number 9    Date for PT Re-Evaluation 02/04/23    Authorization Type [facility]- Workers comp approved 13 visits    Authorization - Visit Number 9    Authorization - Number of Visits 13    PT Start Time 0800    PT Stop Time 0844    PT Time Calculation (min) 44 min    Activity Tolerance Patient tolerated treatment well    Behavior During Therapy WFL for tasks assessed/performed                     Past Medical History:  Diagnosis Date   Anxiety    Arthritis    "My Dr said I do , I dont know."   Bipolar disorder (HCC) age 54   Depression    GDM (gestational diabetes mellitus)    history of GDM   GERD (gastroesophageal reflux disease)    Headache    migraines - 07/02/17-  none recently   Pre-diabetes    Past Surgical History:  Procedure Laterality Date   CHOLECYSTECTOMY N/A 07/05/2017   Procedure: LAPAROSCOPIC CHOLECYSTECTOMY;  Surgeon: Berna Bue, MD;  Location: [facility];  Service: General;  Laterality: N/A;   TUBAL LIGATION  09/17/2011   Procedure: POST PARTUM TUBAL LIGATION;  Surgeon: Purcell Nails, MD;  Location: [facility];  Service: Gynecology;  Laterality: Bilateral;   WISDOM TOOTH EXTRACTION     Patient Active Problem List   Diagnosis Date Noted   Hyperlipidemia associated with type 2 diabetes mellitus (HCC) 12/03/2021   Vitamin D deficiency 12/03/2021   DM (diabetes mellitus), type 2 (HCC) 06/07/2018   NSVD (normal spontaneous vaginal delivery) 09/16/2011   Noncompliance with medications 08/25/2011   Noncompliance of patient with dietary regimen 08/25/2011   GDM (gestational diabetes mellitus) 08/25/2011   Gestational diabetes 07/16/2011   H/O gestational diabetes in prior pregnancy, currently pregnant 06/10/2011    PCP: Philip Aspen, Limmie Patricia, MD   REFERRING PROVIDER: Sheral Apley, MD   REFERRING DIAG: [phone] (ICD-10-CM) - Left knee pain   THERAPY DIAG:  Acute pain of left knee - Plan: PT plan of care cert/re-cert  Cramp and spasm - Plan: PT plan of care cert/re-cert  Muscle weakness (generalized) - Plan: PT plan of care cert/re-cert  Rationale for Evaluation and Treatment: Rehabilitation  ONSET DATE: 11/18/22  SUBJECTIVE:   SUBJECTIVE STATEMENT: I was playing with the dog and I had some aching in the knee.  No pain, just aching. I'm getting better.    PERTINENT HISTORY: DM, ADHD  PAIN: 02/01/23:  Are you having pain? Yes: NPRS scale: 0-6/10 Pain location: below Lt knee Pain description: quad tightness, burning/tingling in knee Aggravating factors: prolonged sitting x 30 min, driving,  prolonged walking/standing for prolonged periods  Relieving factors: elevate, ice or heat  PRECAUTIONS: None  RED FLAGS: None   WEIGHT BEARING RESTRICTIONS: No  FALLS:  Has patient fallen in last 6 months? Yes. Number of falls 1   LIVING ENVIRONMENT: Lives with: lives with their spouse Lives in: House/apartment Stairs: Yes: Internal: 13 steps; on left going up and External: 1 steps; none Has following equipment at home: None  OCCUPATION: Nurse tech  PLOF: Independent  PATIENT GOALS: I want to feel normal  NEXT MD VISIT:  02/12/23  OBJECTIVE:  Note: Objective measures were completed at Evaluation unless otherwise noted.  DIAGNOSTIC FINDINGS: XR - mild OA  PATIENT SURVEYS:  LEFS 42 / 80 = 52.5 % 01/28/23: LEFS 53/80  COGNITION: Overall cognitive status: Within functional limits for tasks assessed     SENSATION: WFL  MUSCLE LENGTH: Tight HS B  PALPATION: Increased tone and trigger points in L ant tib, peroneals and distal lateral quads B patellar mobility hypermobile  LOWER EXTREMITY ROM:  Active ROM Right eval Left eval  Hip flexion    Hip extension    Hip abduction    Hip adduction     Hip internal rotation    Hip external rotation    Knee flexion 124 120  Knee extension hyper ext  hyper ext   Ankle dorsiflexion    Ankle plantarflexion    Ankle inversion    Ankle eversion     (Blank rows = not tested)  LOWER EXTREMITY MMT: *pain  MMT Right eval Left eval  Hip flexion 4+ 4+* pain in quad  Hip extension 5 5  Hip abduction 5 5  Hip adduction    Hip internal rotation    Hip external rotation    Knee flexion    Knee extension 5 5  Ankle dorsiflexion 5 5* pain in quad  Ankle plantarflexion    Ankle inversion    Ankle eversion     (Blank rows = not tested)  LOWER EXTREMITY SPECIAL TESTS:  Knee special tests: Anterior drawer test: negative, Posterior drawer test: negative, Apley's compression test: negative, Apley's distraction test: negative, and varus/valgus stress - negative  GAIT: WNL  TODAY'S TREATMENT:     DATE:   02/01/23 Elliptical: Ramp 4, level 5x 6 minutes Leg press: seat 5 80# bil legs 2x10, single leg: 80# Rt, 80# Lt 2x10 Standing rockerboard: x3 minutes  Seated hamstring stretch 3x20 seconds  Bosu lunges: Lt and Rt 2x10 Sit to stand with staggered stance: added 10# kettle bell 2x10 bil each Walking around building with 10# on Rt and Lt each lap Resisted walking: 4 ways 15# forward and reverse, 10# sidestepping x5 each   01/28/23 Elliptical: Ramp 4, level 5x 6 minutes Leg press: seat 5 80# bil legs 2x10, single leg: 80# Rt, 50# Lt 2x10 bil each , 60# 2x10 Standing rockerboard: x3 minutes  Seated hamstring stretch 3x20 seconds  Bosu lunges: Lt and Rt 2x10 Sit to stand with staggered stance: added 10# kettle bell 2x10 bil each Walking around building with 10# on Rt and Lt each lap  01/21/23 NuStep: level 4x 6 min- legs only.  PT present to discuss progress  Leg press: seat 5 80# bil legs 2x10, single leg: 60# Rt, 60# Lt 2x10 bil each  Standing rockerboard: x3 minutes  Seated hamstring stretch 3x20 seconds  Bosu ball step ups: Lt  and Rt 2x10 Sit to stand with staggered stance: added 5# kettle bell 2x10 bil each Resisted walking: 4 ways 15# forward and reverse, 10# sidestepping x5 each   PATIENT EDUCATION:  Education details: PT eval findings, anticipated POC, initial HEP, and DN rational, procedure, outcomes, potential side effects, and recommended post-treatment exercises/activity  Person educated: Patient Education method: Explanation, Demonstration, and Handouts Education comprehension: verbalized understanding and returned demonstration  HOME EXERCISE PROGRAM: Access Code: [email or web address] URL: [email or web address] Date: 01/05/2023 Prepared by: Raynelle Fanning  Exercises - Supine Quad Set  - 1 x daily - 7 x weekly - 2 sets - 10 reps - 5 sec hold -  Active Straight Leg Raise with Quad Set  - 1 x daily - 7 x weekly - 2 sets - 10 reps - Seated Long Arc Quad  - 1 x daily - 7 x weekly - 2 sets - 10 reps - 5 sec hold - Supine Knee Extension Strengthening  - 1 x daily - 7 x weekly - 2 sets - 10 reps - 5 sec hold - Seated Ankle Plantarflexion Dorsiflexion PROM  - 2 x daily - 7 x weekly - 1 sets - 3 reps - 30-60 sec hold - Supine Hamstring Stretch  - 2 x daily - 7 x weekly - 1 sets - 3 reps - 30 sec hold - Straight Leg Raise with External Rotation  - 1 x daily - 7 x weekly - 2 sets - 10 reps  ASSESSMENT:  CLINICAL IMPRESSION: Pt reports aching in her Lt knee after running after he dog yesterday.  Overall, she reports 80% overall improvement since the start of care.  She has increased pain/achy ness with long periods of standing and stair climbing.  Pt did well with increased weight with leg press.  Pt was more balanced with carrying weight on Lt side abound building today.  LEFS score improved to 53/80 last week, indicating improved function.   PT monitored throughout session for technique and pain. Patient will benefit from skilled PT to address the below impairments and improve overall function.   OBJECTIVE  IMPAIRMENTS: decreased activity tolerance, decreased ROM, decreased strength, increased muscle spasms, impaired flexibility, and pain.   ACTIVITY LIMITATIONS: sitting, standing, and locomotion level  PARTICIPATION LIMITATIONS: occupation  PERSONAL FACTORS: 1 comorbidity: DM  are also affecting patient's functional outcome.   REHAB POTENTIAL: Excellent  CLINICAL DECISION MAKING: Evolving/moderate complexity  EVALUATION COMPLEXITY: Low   SHORT TERM GOALS: Target date: 01/14/2023    Ind with initial HEP Baseline: Goal status: MET  2.  Decreased knee pain by 25%. Baseline: intermittent by 80%  Goal status: met   LONG TERM GOALS: Target date: 03/05/23  Ind with advanced HEP and its progression Baseline:  Goal status: In progress   2. Pt able to sit and stand without knee pain to allow return to full duty at work Baseline: pain up to 6/10 in Lt knee with long periods of standing (02/01/23) Goal status: In progress   3.  Improved LE strength to 5/5 to improve function Baseline:  Goal status: INITIAL  4.  Able to climb stairs without pain Baseline: able to do this at home, 1 flight.  Pain at work with longer flights (02/01/23) Goal status: in progress  5.   Improved LEFS to 65 showing functional improvement Baseline: 53 (01/28/23) Goal status:revised    PLAN:  PT FREQUENCY: 2x/week  PT DURATION: 4 weeks  PLANNED INTERVENTIONS: 97164- PT Re-evaluation, 97110-Therapeutic exercises, 97530- Therapeutic activity, 97112- Neuromuscular re-education, 97535- Self Care, 96045- Manual therapy, [id number]- Gait training, 97014- Electrical stimulation (unattended), 97035- Ultrasound, 40981- Ionotophoresis 4mg /ml Dexamethasone, Patient/Family education, Balance training, Stair training, Taping, Dry Needling, Joint mobilization, Spinal mobilization, Cryotherapy, and Moist heat  PLAN FOR NEXT SESSION: Work on [employer] LE stability, strength, flexibility.  3 more sessions approved.          Lorrene Reid, PT 02/01/23 8:47 AM

## 2023-02-05 ENCOUNTER — Encounter: Payer: Self-pay | Admitting: Physical Therapy

## 2023-02-05 ENCOUNTER — Ambulatory Visit: Payer: PRIVATE HEALTH INSURANCE | Attending: Orthopedic Surgery | Admitting: Physical Therapy

## 2023-02-05 DIAGNOSIS — M6281 Muscle weakness (generalized): Secondary | ICD-10-CM | POA: Diagnosis present

## 2023-02-05 DIAGNOSIS — M25562 Pain in left knee: Secondary | ICD-10-CM | POA: Diagnosis present

## 2023-02-05 DIAGNOSIS — R252 Cramp and spasm: Secondary | ICD-10-CM | POA: Diagnosis present

## 2023-02-05 NOTE — Therapy (Signed)
 OUTPATIENT PHYSICAL THERAPY TREATMENT   Patient Name: Jasmine Huerta MRN: [id number] DOB:Jul 12, 1985, 38 y.o., female Today's Date: 02/05/2023  END OF SESSION:  PT End of Session - 02/05/23 1105     Visit Number 10    Date for PT Re-Evaluation 03/05/23    Authorization Type [facility]- Workers comp approved 13 visits    Authorization - Visit Number 10    Authorization - Number of Visits 13    PT Start Time 1014    PT Stop Time 1059    PT Time Calculation (min) 45 min    Activity Tolerance Patient tolerated treatment well    Behavior During Therapy WFL for tasks assessed/performed                      Past Medical History:  Diagnosis Date   Anxiety    Arthritis    My Dr said I do , I dont know.   Bipolar disorder [facility]) age 36   Depression    GDM (gestational diabetes mellitus)    history of GDM   GERD (gastroesophageal reflux disease)    Headache    migraines - 07/02/17-  none recently   Pre-diabetes    Past Surgical History:  Procedure Laterality Date   CHOLECYSTECTOMY N/A 07/05/2017   Procedure: LAPAROSCOPIC CHOLECYSTECTOMY;  Surgeon: Signe Mitzie LABOR, MD;  Location: [facility];  Service: General;  Laterality: N/A;   TUBAL LIGATION  09/17/2011   Procedure: POST PARTUM TUBAL LIGATION;  Surgeon: Jon CINDERELLA Rummer, MD;  Location: [facility];  Service: Gynecology;  Laterality: Bilateral;   WISDOM TOOTH EXTRACTION     Patient Active Problem List   Diagnosis Date Noted   Hyperlipidemia associated with type 2 diabetes mellitus (HCC) 12/03/2021   Vitamin D  deficiency 12/03/2021   DM (diabetes mellitus), type 2 (HCC) 06/07/2018   NSVD (normal spontaneous vaginal delivery) 09/16/2011   Noncompliance with medications 08/25/2011   Noncompliance of patient with dietary regimen 08/25/2011   GDM (gestational diabetes mellitus) 08/25/2011   Gestational diabetes 07/16/2011   H/O gestational diabetes in prior pregnancy, currently pregnant 06/10/2011    PCP: Theophilus Andrews,  Tully CINDERELLA, MD   REFERRING PROVIDER: Beverley Evalene BIRCH, MD   REFERRING DIAG: [phone] (ICD-10-CM) - Left knee pain   THERAPY DIAG:  Acute pain of left knee  Cramp and spasm  Muscle weakness (generalized)  Rationale for Evaluation and Treatment: Rehabilitation  ONSET DATE: 11/18/22  SUBJECTIVE:   SUBJECTIVE STATEMENT: I was on the floor at work and I felt a sharp spasm in my knee. Right now it feels achy.   PERTINENT HISTORY: DM, ADHD  PAIN: 02/01/23:  Are you having pain? Yes: NPRS scale: 0-6/10 Pain location: below Lt knee Pain description: quad tightness, burning/tingling in knee Aggravating factors: prolonged sitting x 30 min, driving,  prolonged walking/standing for prolonged periods  Relieving factors: elevate, ice or heat  PRECAUTIONS: None  RED FLAGS: None   WEIGHT BEARING RESTRICTIONS: No  FALLS:  Has patient fallen in last 6 months? Yes. Number of falls 1   LIVING ENVIRONMENT: Lives with: lives with their spouse Lives in: House/apartment Stairs: Yes: Internal: 13 steps; on left going up and External: 1 steps; none Has following equipment at home: None  OCCUPATION: Nurse tech  PLOF: Independent  PATIENT GOALS: I want to feel normal  NEXT MD VISIT: 02/12/23  OBJECTIVE:  Note: Objective measures were completed at Evaluation unless otherwise noted.  DIAGNOSTIC FINDINGS: XR - mild OA  PATIENT SURVEYS:  LEFS 42 / 80 = 52.5 % 01/28/23: LEFS 53/80  COGNITION: Overall cognitive status: Within functional limits for tasks assessed     SENSATION: WFL  MUSCLE LENGTH: Tight HS B  PALPATION: Increased tone and trigger points in L ant tib, peroneals and distal lateral quads B patellar mobility hypermobile  LOWER EXTREMITY ROM:  Active ROM Right eval Left eval  Hip flexion    Hip extension    Hip abduction    Hip adduction    Hip internal rotation    Hip external rotation    Knee flexion 124 120  Knee extension hyper ext  hyper ext    Ankle dorsiflexion    Ankle plantarflexion    Ankle inversion    Ankle eversion     (Blank rows = not tested)  LOWER EXTREMITY MMT: *pain  MMT Right eval Left eval  Hip flexion 4+ 4+* pain in quad  Hip extension 5 5  Hip abduction 5 5  Hip adduction    Hip internal rotation    Hip external rotation    Knee flexion    Knee extension 5 5  Ankle dorsiflexion 5 5* pain in quad  Ankle plantarflexion    Ankle inversion    Ankle eversion     (Blank rows = not tested)  LOWER EXTREMITY SPECIAL TESTS:  Knee special tests: Anterior drawer test: negative, Posterior drawer test: negative, Apley's compression test: negative, Apley's distraction test: negative, and varus/valgus stress - negative  GAIT: WNL  TODAY'S TREATMENT:     DATE:   02/05/2023 Elliptical: Ramp 4, level 5x 6 minutes Leg press: seat 5 80# bil legs 2x10, single leg: 70# Rt, 70# Lt 2x10 Standing rockerboard: x3 minutes  Seated hamstring stretch 3x20 seconds bilateral  Bosu lunges: forwards & lateral 2 x 10 bilateral  Sit to stand with staggered stance: added 10# kettle bell 2x10 bil each Walking around building with 10# on Rt and Lt each lap Single leg cone taps ( cone on 16 inch box) x 20 taps ; once on each leg Resisted walking: 4 ways 17# forward and reverse, 13# sidestepping x5 each    02/01/23 Elliptical: Ramp 4, level 5x 6 minutes Leg press: seat 5 80# bil legs 2x10, single leg: 80# Rt, 80# Lt 2x10 Standing rockerboard: x3 minutes  Seated hamstring stretch 3x20 seconds  Bosu lunges: Lt and Rt 2x10 Sit to stand with staggered stance: added 10# kettle bell 2x10 bil each Walking around building with 10# on Rt and Lt each lap Resisted walking: 4 ways 15# forward and reverse, 10# sidestepping x5 each   01/28/23 Elliptical: Ramp 4, level 5x 6 minutes Leg press: seat 5 80# bil legs 2x10, single leg: 80# Rt, 50# Lt 2x10 bil each , 60# 2x10 Standing rockerboard: x3 minutes  Seated hamstring stretch 3x20  seconds  Bosu lunges: Lt and Rt 2x10 Sit to stand with staggered stance: added 10# kettle bell 2x10 bil each Walking around building with 10# on Rt and Lt each lap   PATIENT EDUCATION:  Education details: PT eval findings, anticipated POC, initial HEP, and DN rational, procedure, outcomes, potential side effects, and recommended post-treatment exercises/activity  Person educated: Patient Education method: Explanation, Demonstration, and Handouts Education comprehension: verbalized understanding and returned demonstration  HOME EXERCISE PROGRAM: Access Code: [email or web address] URL: [email or web address] Date: 01/05/2023 Prepared by: Mliss  Exercises - Supine Quad Set  - 1 x daily - 7 x weekly - 2 sets - 10 reps - 5  sec hold - Active Straight Leg Raise with Quad Set  - 1 x daily - 7 x weekly - 2 sets - 10 reps - Seated Long Arc Quad  - 1 x daily - 7 x weekly - 2 sets - 10 reps - 5 sec hold - Supine Knee Extension Strengthening  - 1 x daily - 7 x weekly - 2 sets - 10 reps - 5 sec hold - Seated Ankle Plantarflexion Dorsiflexion PROM  - 2 x daily - 7 x weekly - 1 sets - 3 reps - 30-60 sec hold - Supine Hamstring Stretch  - 2 x daily - 7 x weekly - 1 sets - 3 reps - 30 sec hold - Straight Leg Raise with External Rotation  - 1 x daily - 7 x weekly - 2 sets - 10 reps  ASSESSMENT:  CLINICAL IMPRESSION: Patient reports she did more standing and walking at work yesterday and that bothered her knee a little bit. She presents with an achy pain today. [employer] single leg balance activities today which were challenging. Patient required verbal and visual cues for correct exercise performance. Decreased single leg leg press weight due to patient verbalizing feeling increased challenge.Patient will benefit from skilled PT to address the below impairments and improve overall function.     OBJECTIVE IMPAIRMENTS: decreased activity tolerance, decreased ROM, decreased strength, increased  muscle spasms, impaired flexibility, and pain.   ACTIVITY LIMITATIONS: sitting, standing, and locomotion level  PARTICIPATION LIMITATIONS: occupation  PERSONAL FACTORS: 1 comorbidity: DM  are also affecting patient's functional outcome.   REHAB POTENTIAL: Excellent  CLINICAL DECISION MAKING: Evolving/moderate complexity  EVALUATION COMPLEXITY: Low   SHORT TERM GOALS: Target date: 01/14/2023    Ind with initial HEP Baseline: Goal status: MET  2.  Decreased knee pain by 25%. Baseline: intermittent by 80%  Goal status: met   LONG TERM GOALS: Target date: 03/05/23  Ind with advanced HEP and its progression Baseline:  Goal status: In progress   2. Pt able to sit and stand without knee pain to allow return to full duty at work Baseline: pain up to 6/10 in Lt knee with long periods of standing (02/01/23) Goal status: In progress   3.  Improved LE strength to 5/5 to improve function Baseline:  Goal status: INITIAL  4.  Able to climb stairs without pain Baseline: able to do this at home, 1 flight.  Pain at work with longer flights (02/01/23) Goal status: in progress  5.   Improved LEFS to 65 showing functional improvement Baseline: 53 (01/28/23) Goal status:revised    PLAN:  PT FREQUENCY: 2x/week  PT DURATION: 4 weeks  PLANNED INTERVENTIONS: 97164- PT Re-evaluation, 97110-Therapeutic exercises, 97530- Therapeutic activity, 97112- Neuromuscular re-education, 97535- Self Care, 02859- Manual therapy, [id number]- Gait training, 97014- Electrical stimulation (unattended), 97035- Ultrasound, 02966- Ionotophoresis 4mg /ml Dexamethasone , Patient/Family education, Balance training, Stair training, Taping, Dry Needling, Joint mobilization, Spinal mobilization, Cryotherapy, and Moist heat  PLAN FOR NEXT SESSION: Work on Lt LE stability & single leg balance,  2 more sessions approved.         Kristeen Sar, PT 02/05/23 11:09 AM

## 2023-02-09 ENCOUNTER — Ambulatory Visit: Payer: PRIVATE HEALTH INSURANCE

## 2023-02-09 DIAGNOSIS — M25562 Pain in left knee: Secondary | ICD-10-CM

## 2023-02-09 DIAGNOSIS — R252 Cramp and spasm: Secondary | ICD-10-CM

## 2023-02-09 DIAGNOSIS — M6281 Muscle weakness (generalized): Secondary | ICD-10-CM

## 2023-02-09 NOTE — Therapy (Signed)
 OUTPATIENT PHYSICAL THERAPY TREATMENT   Patient Name: Jasmine Huerta MRN: [id number] DOB:1986/01/18, 38 y.o., female Today's Date: 02/09/2023  END OF SESSION:  PT End of Session - 02/09/23 1226     Visit Number 11    Date for PT Re-Evaluation 03/05/23    Authorization Type [facility]- Workers comp approved 13 visits    Authorization - Visit Number 11    Authorization - Number of Visits 13    PT Start Time 1147    PT Stop Time 1228    PT Time Calculation (min) 41 min    Activity Tolerance Patient tolerated treatment well    Behavior During Therapy WFL for tasks assessed/performed                       Past Medical History:  Diagnosis Date   Anxiety    Arthritis    My Dr said I do , I dont know.   Bipolar disorder [facility]) age 38   Depression    GDM (gestational diabetes mellitus)    history of GDM   GERD (gastroesophageal reflux disease)    Headache    migraines - 07/02/17-  none recently   Pre-diabetes    Past Surgical History:  Procedure Laterality Date   CHOLECYSTECTOMY N/A 07/05/2017   Procedure: LAPAROSCOPIC CHOLECYSTECTOMY;  Surgeon: Signe Mitzie LABOR, MD;  [facility]: [facility];  Service: General;  Laterality: N/A;   TUBAL LIGATION  09/17/2011   Procedure: POST PARTUM TUBAL LIGATION;  Surgeon: Jon CINDERELLA Rummer, MD;  Location: [facility];  Service: Gynecology;  Laterality: Bilateral;   WISDOM TOOTH EXTRACTION     Patient Active Problem List   Diagnosis Date Noted   Hyperlipidemia associated with type 2 diabetes mellitus (HCC) 12/03/2021   Vitamin D  deficiency 12/03/2021   DM (diabetes mellitus), type 2 (HCC) 06/07/2018   NSVD (normal spontaneous vaginal delivery) 09/16/2011   Noncompliance with medications 08/25/2011   Noncompliance of patient with dietary regimen 08/25/2011   GDM (gestational diabetes mellitus) 08/25/2011   Gestational diabetes 07/16/2011   H/O gestational diabetes in prior pregnancy, currently pregnant 06/10/2011    PCP: Theophilus Andrews,  Tully CINDERELLA, MD   REFERRING PROVIDER: Beverley Evalene BIRCH, MD   REFERRING DIAG: [phone] (ICD-10-CM) - Left knee pain   THERAPY DIAG:  Acute pain of left knee  Cramp and spasm  Muscle weakness (generalized)  Rationale for Evaluation and Treatment: Rehabilitation  ONSET DATE: 11/18/22  SUBJECTIVE:   SUBJECTIVE STATEMENT: I am feeling much better overall.  I had some throbbing in the knee after a long drive to [place] .  I'm wearing a knee brace when I work.    PERTINENT HISTORY: DM, ADHD  PAIN: 02/09/23: Are you having pain? Yes: NPRS scale: 1/10 Pain location: below Lt knee Pain description: quad tightness, burning/tingling in knee Aggravating factors: prolonged sitting x 30 min, driving,  prolonged walking/standing for prolonged periods  Relieving factors: elevate, ice or heat  PRECAUTIONS: None  RED FLAGS: None   WEIGHT BEARING RESTRICTIONS: No  FALLS:  Has patient fallen in last 6 months? Yes. Number of falls 1   LIVING ENVIRONMENT: Lives with: lives with their spouse Lives in: House/apartment Stairs: Yes: Internal: 13 steps; on left going up and External: 1 steps; none Has following equipment at home: None  OCCUPATION: Nurse tech  PLOF: Independent  PATIENT GOALS: I want to feel normal  NEXT MD VISIT: 02/12/23  OBJECTIVE:  Note: Objective measures were completed at Evaluation unless otherwise  noted.  DIAGNOSTIC FINDINGS: XR - mild OA  PATIENT SURVEYS:  LEFS 42 / 80 = 52.5 % 01/28/23: LEFS 53/80  COGNITION: Overall cognitive status: Within functional limits for tasks assessed     SENSATION: WFL  MUSCLE LENGTH: Tight HS B  PALPATION: Increased tone and trigger points in L ant tib, peroneals and distal lateral quads B patellar mobility hypermobile  LOWER EXTREMITY ROM:  Active ROM Right eval Left eval  Hip flexion    Hip extension    Hip abduction    Hip adduction    Hip internal rotation    Hip external rotation    Knee flexion 124  120  Knee extension hyper ext  hyper ext   Ankle dorsiflexion    Ankle plantarflexion    Ankle inversion    Ankle eversion     (Blank rows = not tested)  LOWER EXTREMITY MMT: *pain  MMT Right eval Left eval  Hip flexion 4+ 4+* pain in quad  Hip extension 5 5  Hip abduction 5 5  Hip adduction    Hip internal rotation    Hip external rotation    Knee flexion    Knee extension 5 5  Ankle dorsiflexion 5 5* pain in quad  Ankle plantarflexion    Ankle inversion    Ankle eversion     (Blank rows = not tested)  LOWER EXTREMITY SPECIAL TESTS:  Knee special tests: Anterior drawer test: negative, Posterior drawer test: negative, Apley's compression test: negative, Apley's distraction test: negative, and varus/valgus stress - negative  GAIT: WNL  TODAY'S TREATMENT:     DATE:   02/09/2023 Elliptical: Ramp 4, level 5x 6 minutes Leg press: seat 5 80# bil legs 2x10, single leg: 70# Rt, 70# Lt 2x10 Standing rockerboard: x3 minutes  Seated hamstring stretch 3x20 seconds bilateral  Bosu lunges: forwards & lateral 2 x 10 bilateral  Sit to stand with staggered stance: added 15# kettle bell 2x10 bil each Walking around building with 15# on Rt and Lt each lap Single leg cone taps ( cone on 16 inch box) x 20 taps ; once on each leg Resisted walking: 4 ways 15# forward and reverse, 15# sidestepping x5 each   02/05/2023 Elliptical: Ramp 4, level 5x 6 minutes Leg press: seat 5 80# bil legs 2x10, single leg: 70# Rt, 70# Lt 2x10 Standing rockerboard: x3 minutes  Seated hamstring stretch 3x20 seconds bilateral  Bosu lunges: forwards & lateral 2 x 10 bilateral  Sit to stand with staggered stance: added 10# kettle bell 2x10 bil each Walking around building with 10# on Rt and Lt each lap Single leg cone taps ( cone on 16 inch box) x 20 taps ; once on each leg Resisted walking: 4 ways 17# forward and reverse, 13# sidestepping x5 each    02/01/23 Elliptical: Ramp 4, level 5x 6 minutes Leg press:  seat 5 80# bil legs 2x10, single leg: 80# Rt, 80# Lt 2x10 Standing rockerboard: x3 minutes  Seated hamstring stretch 3x20 seconds  Bosu lunges: Lt and Rt 2x10 Sit to stand with staggered stance: added 10# kettle bell 2x10 bil each Walking around building with 10# on Rt and Lt each lap Resisted walking: 4 ways 15# forward and reverse, 10# sidestepping x5 each    PATIENT EDUCATION:  Education details: PT eval findings, anticipated POC, initial HEP, and DN rational, procedure, outcomes, potential side effects, and recommended post-treatment exercises/activity  Person educated: Patient Education method: Explanation, Demonstration, and Handouts Education comprehension: verbalized understanding  and returned demonstration  HOME EXERCISE PROGRAM: Access Code: [email or web address] URL: [email or web address] Date: 01/05/2023 Prepared by: Mliss  Exercises - Supine Quad Set  - 1 x daily - 7 x weekly - 2 sets - 10 reps - 5 sec hold - Active Straight Leg Raise with Quad Set  - 1 x daily - 7 x weekly - 2 sets - 10 reps - Seated Long Arc Quad  - 1 x daily - 7 x weekly - 2 sets - 10 reps - 5 sec hold - Supine Knee Extension Strengthening  - 1 x daily - 7 x weekly - 2 sets - 10 reps - 5 sec hold - Seated Ankle Plantarflexion Dorsiflexion PROM  - 2 x daily - 7 x weekly - 1 sets - 3 reps - 30-60 sec hold - Supine Hamstring Stretch  - 2 x daily - 7 x weekly - 1 sets - 3 reps - 30 sec hold - Straight Leg Raise with External Rotation  - 1 x daily - 7 x weekly - 2 sets - 10 reps  ASSESSMENT:  CLINICAL IMPRESSION: Pt had increased pain with sitting in the car for a long trip.  Pain has resolved now. Pt has more challenge with leg press on the Lt vs Rt but able to complete with equal weight. Pt was able to increase weight with sit to stand, walk around the clinic and side stepping.  Patient required verbal and visual cues for correct exercise performance with select exercises. Decreased single leg leg  press weight due to patient verbalizing feeling increased challenge.Patient will benefit from skilled PT to address the below impairments and improve overall function.  OBJECTIVE IMPAIRMENTS: decreased activity tolerance, decreased ROM, decreased strength, increased muscle spasms, impaired flexibility, and pain.   ACTIVITY LIMITATIONS: sitting, standing, and locomotion level  PARTICIPATION LIMITATIONS: occupation  PERSONAL FACTORS: 1 comorbidity: DM  are also affecting patient's functional outcome.   REHAB POTENTIAL: Excellent  CLINICAL DECISION MAKING: Evolving/moderate complexity  EVALUATION COMPLEXITY: Low   SHORT TERM GOALS: Target date: 01/14/2023    Ind with initial HEP Baseline: Goal status: MET  2.  Decreased knee pain by 25%. Baseline: intermittent by 80%  Goal status: met   LONG TERM GOALS: Target date: 03/05/23  Ind with advanced HEP and its progression Baseline:  Goal status: In progress   2. Pt able to sit and stand without knee pain to allow return to full duty at work Baseline: pain up to 6/10 in Lt knee with long periods of standing (02/01/23) Goal status: In progress   3.  Improved LE strength to 5/5 to improve function Baseline:  Goal status: INITIAL  4.  Able to climb stairs without pain Baseline: able to do this at home, 1 flight.  Pain at work with longer flights (02/01/23) Goal status: in progress  5.   Improved LEFS to 65 showing functional improvement Baseline: 53 (01/28/23) Goal status:revised    PLAN:  PT FREQUENCY: 2x/week  PT DURATION: 4 weeks  PLANNED INTERVENTIONS: 97164- PT Re-evaluation, 97110-Therapeutic exercises, 97530- Therapeutic activity, 97112- Neuromuscular re-education, 97535- Self Care, 02859- Manual therapy, [id number]- Gait training, 97014- Electrical stimulation (unattended), 97035- Ultrasound, 02966- Ionotophoresis 4mg /ml Dexamethasone , Patient/Family education, Balance training, Stair training, Taping, Dry Needling,  Joint mobilization, Spinal mobilization, Cryotherapy, and Moist heat  PLAN FOR NEXT SESSION: Work on Lt LE stability & single leg balance,  2 more sessions approved.         Burnard Joy, PT 02/09/23 12:33 PM

## 2023-02-11 ENCOUNTER — Ambulatory Visit: Payer: PRIVATE HEALTH INSURANCE | Attending: Orthopedic Surgery

## 2023-02-11 DIAGNOSIS — M25562 Pain in left knee: Secondary | ICD-10-CM | POA: Diagnosis present

## 2023-02-11 DIAGNOSIS — R252 Cramp and spasm: Secondary | ICD-10-CM | POA: Insufficient documentation

## 2023-02-11 DIAGNOSIS — M6281 Muscle weakness (generalized): Secondary | ICD-10-CM | POA: Insufficient documentation

## 2023-02-11 NOTE — Therapy (Signed)
 OUTPATIENT PHYSICAL THERAPY TREATMENT   Patient Name: Jasmine Huerta MRN: [id number] DOB:1986-01-21, 38 y.o., female Today's Date: 02/11/2023  END OF SESSION:  PT End of Session - 02/11/23 1059     Visit Number 12    Date for PT Re-Evaluation 03/05/23    Authorization Type [facility]- Workers comp approved 13 visits    Authorization - Visit Number 12    Authorization - Number of Visits 13    PT Start Time 1017    PT Stop Time 1059    PT Time Calculation (min) 42 min    Activity Tolerance Patient tolerated treatment well    Behavior During Therapy WFL for tasks assessed/performed                        Past Medical History:  Diagnosis Date   Anxiety    Arthritis    My Dr said I do , I dont know.   Bipolar disorder [facility]) age 66   Depression    GDM (gestational diabetes mellitus)    history of GDM   GERD (gastroesophageal reflux disease)    Headache    migraines - 07/02/17-  none recently   Pre-diabetes    Past Surgical History:  Procedure Laterality Date   CHOLECYSTECTOMY N/A 07/05/2017   Procedure: LAPAROSCOPIC CHOLECYSTECTOMY;  Surgeon: Signe Mitzie LABOR, MD;  Location: [facility];  Service: General;  Laterality: N/A;   TUBAL LIGATION  09/17/2011   Procedure: POST PARTUM TUBAL LIGATION;  Surgeon: Jon CINDERELLA Rummer, MD;  Location: [facility];  Service: Gynecology;  Laterality: Bilateral;   WISDOM TOOTH EXTRACTION     Patient Active Problem List   Diagnosis Date Noted   Hyperlipidemia associated with type 2 diabetes mellitus (HCC) 12/03/2021   Vitamin D  deficiency 12/03/2021   DM (diabetes mellitus), type 2 (HCC) 06/07/2018   NSVD (normal spontaneous vaginal delivery) 09/16/2011   Noncompliance with medications 08/25/2011   Noncompliance of patient with dietary regimen 08/25/2011   GDM (gestational diabetes mellitus) 08/25/2011   Gestational diabetes 07/16/2011   H/O gestational diabetes in prior pregnancy, currently pregnant 06/10/2011    PCP: Theophilus Andrews,  Tully CINDERELLA, MD   REFERRING PROVIDER: Beverley Evalene BIRCH, MD   REFERRING DIAG: [phone] (ICD-10-CM) - Left knee pain   THERAPY DIAG:  Acute pain of left knee  Cramp and spasm  Muscle weakness (generalized)  Rationale for Evaluation and Treatment: Rehabilitation  ONSET DATE: 11/18/22  SUBJECTIVE:   SUBJECTIVE STATEMENT: No pain today.  I'm not working today.   PERTINENT HISTORY: DM, ADHD  PAIN: 02/09/23: Are you having pain? Yes: NPRS scale: 1/10 Pain location: below Lt knee Pain description: quad tightness, burning/tingling in knee Aggravating factors: prolonged sitting x 30 min, driving,  prolonged walking/standing for prolonged periods  Relieving factors: elevate, ice or heat  PRECAUTIONS: None  RED FLAGS: None   WEIGHT BEARING RESTRICTIONS: No  FALLS:  Has patient fallen in last 6 months? Yes. Number of falls 1   LIVING ENVIRONMENT: Lives with: lives with their spouse Lives in: House/apartment Stairs: Yes: Internal: 13 steps; on left going up and External: 1 steps; none Has following equipment at home: None  OCCUPATION: Nurse tech  PLOF: Independent  PATIENT GOALS: I want to feel normal  NEXT MD VISIT: 02/12/23  OBJECTIVE:  Note: Objective measures were completed at Evaluation unless otherwise noted.  DIAGNOSTIC FINDINGS: XR - mild OA  PATIENT SURVEYS:  LEFS 42 / 80 = 52.5 % 01/28/23: LEFS  53/80  COGNITION: Overall cognitive status: Within functional limits for tasks assessed     SENSATION: WFL  MUSCLE LENGTH: Tight HS B  PALPATION: Increased tone and trigger points in L ant tib, peroneals and distal lateral quads B patellar mobility hypermobile  LOWER EXTREMITY ROM:  Active ROM Right eval Left eval  Hip flexion    Hip extension    Hip abduction    Hip adduction    Hip internal rotation    Hip external rotation    Knee flexion 124 120  Knee extension hyper ext  hyper ext   Ankle dorsiflexion    Ankle plantarflexion    Ankle  inversion    Ankle eversion     (Blank rows = not tested)  LOWER EXTREMITY MMT: *pain  MMT Right eval Left eval  Hip flexion 4+ 4+* pain in quad  Hip extension 5 5  Hip abduction 5 5  Hip adduction    Hip internal rotation    Hip external rotation    Knee flexion    Knee extension 5 5  Ankle dorsiflexion 5 5* pain in quad  Ankle plantarflexion    Ankle inversion    Ankle eversion     (Blank rows = not tested)  LOWER EXTREMITY SPECIAL TESTS:  Knee special tests: Anterior drawer test: negative, Posterior drawer test: negative, Apley's compression test: negative, Apley's distraction test: negative, and varus/valgus stress - negative  GAIT: WNL  TODAY'S TREATMENT:     DATE:   02/11/2023 NuStep: Level 6x 6 min-legs only Leg press: seat 5 80# bil legs 2x10, single leg: 70# Rt, 70# Lt 2x10 Standing rockerboard: x3 minutes  Seated hamstring stretch 3x20 seconds bilateral  Bosu lunges: forwards & lateral 2 x 10 bilateral  Sit to stand with staggered stance: added 15# kettle bell 2x10 bil each Walking around building with 15# on Rt and Lt each lap Single leg cone taps ( cone on 16 inch box) x 20 taps ; once on each leg Resisted walking: 4 ways 15# forward and reverse, 15# sidestepping x5 each   02/09/2023 Elliptical: Ramp 4, level 5x 6 minutes Leg press: seat 5 80# bil legs 2x10, single leg: 70# Rt, 70# Lt 2x10 Standing rockerboard: x3 minutes  Seated hamstring stretch 3x20 seconds bilateral  Bosu lunges: forwards & lateral 2 x 10 bilateral  Sit to stand with staggered stance: added 15# kettle bell 2x10 bil each Walking around building with 15# on Rt and Lt each lap Single leg cone taps ( cone on 16 inch box) x 20 taps ; once on each leg Resisted walking: 4 ways 15# forward and reverse, 15# sidestepping x5 each   02/05/2023 Elliptical: Ramp 4, level 5x 6 minutes Leg press: seat 5 80# bil legs 2x10, single leg: 70# Rt, 70# Lt 2x10 Standing rockerboard: x3 minutes  Seated  hamstring stretch 3x20 seconds bilateral  Bosu lunges: forwards & lateral 2 x 10 bilateral  Sit to stand with staggered stance: added 10# kettle bell 2x10 bil each Walking around building with 10# on Rt and Lt each lap Single leg cone taps ( cone on 16 inch box) x 20 taps ; once on each leg Resisted walking: 4 ways 17# forward and reverse, 13# sidestepping x5 each    PATIENT EDUCATION:  Education details: PT eval findings, anticipated POC, initial HEP, and DN rational, procedure, outcomes, potential side effects, and recommended post-treatment exercises/activity  Person educated: Patient Education method: Explanation, Demonstration, and Handouts Education comprehension: verbalized understanding  and returned demonstration  HOME EXERCISE PROGRAM: Access Code: [email or web address] URL: [email or web address] Date: 01/05/2023 Prepared by: Mliss  Exercises - Supine Quad Set  - 1 x daily - 7 x weekly - 2 sets - 10 reps - 5 sec hold - Active Straight Leg Raise with Quad Set  - 1 x daily - 7 x weekly - 2 sets - 10 reps - Seated Long Arc Quad  - 1 x daily - 7 x weekly - 2 sets - 10 reps - 5 sec hold - Supine Knee Extension Strengthening  - 1 x daily - 7 x weekly - 2 sets - 10 reps - 5 sec hold - Seated Ankle Plantarflexion Dorsiflexion PROM  - 2 x daily - 7 x weekly - 1 sets - 3 reps - 30-60 sec hold - Supine Hamstring Stretch  - 2 x daily - 7 x weekly - 1 sets - 3 reps - 30 sec hold - Straight Leg Raise with External Rotation  - 1 x daily - 7 x weekly - 2 sets - 10 reps  ASSESSMENT:  CLINICAL IMPRESSION: Pt arrived without pain.  She is able to negotiate steps at home without difficulty or pain.  If walking multiple flights, she experiences pain in distal Lt quad.  She continues on light duty and reports 4-6/10 Lt knee pain at the end of the shift. Patient required verbal and visual cues for correct exercise performance with select exercises today. Pt will see MD tomorrow to discuss  progress. Patient will benefit from skilled PT to address the below impairments and improve overall function.  OBJECTIVE IMPAIRMENTS: decreased activity tolerance, decreased ROM, decreased strength, increased muscle spasms, impaired flexibility, and pain.   ACTIVITY LIMITATIONS: sitting, standing, and locomotion level  PARTICIPATION LIMITATIONS: occupation  PERSONAL FACTORS: 1 comorbidity: DM  are also affecting patient's functional outcome.   REHAB POTENTIAL: Excellent  CLINICAL DECISION MAKING: Evolving/moderate complexity  EVALUATION COMPLEXITY: Low   SHORT TERM GOALS: Target date: 01/14/2023    Ind with initial HEP Baseline: Goal status: MET  2.  Decreased knee pain by 25%. Baseline: intermittent by 80%  Goal status: met   LONG TERM GOALS: Target date: 03/05/23  Ind with advanced HEP and its progression Baseline:  Goal status: In progress   2. Pt able to sit and stand without knee pain to allow return to full duty at work Baseline: still light duty, reports 4-6/10 with this (02/11/23) Goal status: In progress   3.  Improved LE strength to 5/5 to improve function Baseline:  Goal status: INITIAL  4.  Able to climb stairs without pain Baseline: no pain at home.  With multiple flights at work, some Lt distal quad pain (02/11/23) Goal status: in progress  5.   Improved LEFS to 65 showing functional improvement Baseline: 53 (01/28/23) Goal status:revised    PLAN:  PT FREQUENCY: 2x/week  PT DURATION: 4 weeks  PLANNED INTERVENTIONS: 97164- PT Re-evaluation, 97110-Therapeutic exercises, 97530- Therapeutic activity, 97112- Neuromuscular re-education, 97535- Self Care, 02859- Manual therapy, [id number]- Gait training, 97014- Electrical stimulation (unattended), 97035- Ultrasound, 02966- Ionotophoresis 4mg /ml Dexamethasone , Patient/Family education, Balance training, Stair training, Taping, Dry Needling, Joint mobilization, Spinal mobilization, Cryotherapy, and Moist  heat  PLAN FOR NEXT SESSION: Work on Lt LE stability & single leg balance,  1 more session approved.   See how MD visit went      Burnard Joy, PT 02/11/23 11:01 AM

## 2023-02-16 ENCOUNTER — Ambulatory Visit: Payer: Self-pay

## 2023-02-16 DIAGNOSIS — M6281 Muscle weakness (generalized): Secondary | ICD-10-CM

## 2023-02-16 DIAGNOSIS — M25562 Pain in left knee: Secondary | ICD-10-CM | POA: Diagnosis not present

## 2023-02-16 DIAGNOSIS — R252 Cramp and spasm: Secondary | ICD-10-CM

## 2023-02-16 NOTE — Therapy (Signed)
 OUTPATIENT PHYSICAL THERAPY TREATMENT   Patient Name: Jasmine Huerta MRN: [id number] DOB:06-10-1985, 38 y.o., female Today's Date: 02/16/2023  END OF SESSION:  PT End of Session - 02/16/23 1155     Visit Number 13    Authorization Type [facility]- Workers comp approved 13 visits    Authorization - Visit Number 13    Authorization - Number of Visits 13    PT Start Time 1146    PT Stop Time 1226    PT Time Calculation (min) 40 min    Activity Tolerance Patient tolerated treatment well    Behavior During Therapy WFL for tasks assessed/performed                         Past Medical History:  Diagnosis Date   Anxiety    Arthritis    My Dr said I do , I dont know.   Bipolar disorder [facility]) age 40   Depression    GDM (gestational diabetes mellitus)    history of GDM   GERD (gastroesophageal reflux disease)    Headache    migraines - 07/02/17-  none recently   Pre-diabetes    Past Surgical History:  Procedure Laterality Date   CHOLECYSTECTOMY N/A 07/05/2017   Procedure: LAPAROSCOPIC CHOLECYSTECTOMY;  Surgeon: Signe Mitzie LABOR, MD;  Location: [facility];  Service: General;  Laterality: N/A;   TUBAL LIGATION  09/17/2011   Procedure: POST PARTUM TUBAL LIGATION;  Surgeon: Jon CINDERELLA Rummer, MD;  Location: [facility];  Service: Gynecology;  Laterality: Bilateral;   WISDOM TOOTH EXTRACTION     Patient Active Problem List   Diagnosis Date Noted   Hyperlipidemia associated with type 2 diabetes mellitus (HCC) 12/03/2021   Vitamin D  deficiency 12/03/2021   DM (diabetes mellitus), type 2 (HCC) 06/07/2018   NSVD (normal spontaneous vaginal delivery) 09/16/2011   Noncompliance with medications 08/25/2011   Noncompliance of patient with dietary regimen 08/25/2011   GDM (gestational diabetes mellitus) 08/25/2011   Gestational diabetes 07/16/2011   H/O gestational diabetes in prior pregnancy, currently pregnant 06/10/2011    PCP: Theophilus Andrews, Tully CINDERELLA, MD   REFERRING PROVIDER:  Beverley Evalene BIRCH, MD   REFERRING DIAG: [phone] (ICD-10-CM) - Left knee pain   THERAPY DIAG:  Acute pain of left knee  Cramp and spasm  Muscle weakness (generalized)  Rationale for Evaluation and Treatment: Rehabilitation  ONSET DATE: 11/18/22  SUBJECTIVE:   SUBJECTIVE STATEMENT: I saw the MD.  I am off of light duty now and MD told me to wear the brace.  OK to D/C now.    PERTINENT HISTORY: DM, ADHD  PAIN: 02/16/23: Are you having pain? Yes: NPRS scale: 1/10 Pain location: below Lt knee Pain description: quad tightness, burning/tingling in knee Aggravating factors: prolonged sitting x 30 min, driving,  prolonged walking/standing for prolonged periods  Relieving factors: elevate, ice or heat  PRECAUTIONS: None  RED FLAGS: None   WEIGHT BEARING RESTRICTIONS: No  FALLS:  Has patient fallen in last 6 months? Yes. Number of falls 1   LIVING ENVIRONMENT: Lives with: lives with their spouse Lives in: House/apartment Stairs: Yes: Internal: 13 steps; on left going up and External: 1 steps; none Has following equipment at home: None  OCCUPATION: Nurse tech  PLOF: Independent  PATIENT GOALS: I want to feel normal  NEXT MD VISIT: 02/12/23  OBJECTIVE:  Note: Objective measures were completed at Evaluation unless otherwise noted.  DIAGNOSTIC FINDINGS: XR - mild OA  PATIENT  SURVEYS:  LEFS 42 / 80 = 52.5 % 01/28/23: LEFS 53/80 02/16/23: LEFS 70/80  COGNITION: Overall cognitive status: Within functional limits for tasks assessed     SENSATION: WFL  MUSCLE LENGTH: Tight HS B  PALPATION: Increased tone and trigger points in L ant tib, peroneals and distal lateral quads B patellar mobility hypermobile  LOWER EXTREMITY ROM:  Active ROM Right eval Left eval  Hip flexion    Hip extension    Hip abduction    Hip adduction    Hip internal rotation    Hip external rotation    Knee flexion 124 120  Knee extension hyper ext  hyper ext   Ankle dorsiflexion     Ankle plantarflexion    Ankle inversion    Ankle eversion     (Blank rows = not tested)  LOWER EXTREMITY MMT: *pain  MMT Right eval Left eval Left 02/16/23  Hip flexion 4+ 4+* pain in quad 5  Hip extension 5 5   Hip abduction 5 5   Hip adduction     Hip internal rotation     Hip external rotation     Knee flexion     Knee extension 5 5 5   Ankle dorsiflexion 5 5* pain in quad 5  Ankle plantarflexion     Ankle inversion     Ankle eversion      (Blank rows = not tested)  LOWER EXTREMITY SPECIAL TESTS:  Knee special tests: Anterior drawer test: negative, Posterior drawer test: negative, Apley's compression test: negative, Apley's distraction test: negative, and varus/valgus stress - negative  GAIT: WNL  TODAY'S TREATMENT:     DATE:   02/16/2023 Elliptical: Ramp 4, level 5x 6 minutes Leg press: seat 5 80# bil legs 2x10, single leg: 70# Rt, 70# Lt 2x10 Standing rockerboard: x3 minutes  Seated hamstring stretch 3x20 seconds bilateral  Bosu lunges: forwards & lateral 2 x 10 bilateral  Sit to stand with staggered stance: added 15# kettle bell 2x10 bil each Walking around building with 15# on Rt and Lt each lap Single leg cone taps ( cone on 16 inch box) x 20 taps ; once on each leg   02/11/2023 NuStep: Level 6x 6 min-legs only Leg press: seat 5 80# bil legs 2x10, single leg: 70# Rt, 70# Lt 2x10 Standing rockerboard: x3 minutes  Seated hamstring stretch 3x20 seconds bilateral  Bosu lunges: forwards & lateral 2 x 10 bilateral  Sit to stand with staggered stance: added 15# kettle bell 2x10 bil each Walking around building with 15# on Rt and Lt each lap Single leg cone taps ( cone on 16 inch box) x 20 taps ; once on each leg Resisted walking: 4 ways 15# forward and reverse, 15# sidestepping x5 each   02/09/2023 Elliptical: Ramp 4, level 5x 6 minutes Leg press: seat 5 80# bil legs 2x10, single leg: 70# Rt, 70# Lt 2x10 Standing rockerboard: x3 minutes  Seated hamstring stretch  3x20 seconds bilateral  Bosu lunges: forwards & lateral 2 x 10 bilateral  Sit to stand with staggered stance: added 15# kettle bell 2x10 bil each Walking around building with 15# on Rt and Lt each lap Single leg cone taps ( cone on 16 inch box) x 20 taps ; once on each leg Resisted walking: 4 ways 15# forward and reverse, 15# sidestepping x5 each   PATIENT EDUCATION:  Education details: PT eval findings, anticipated POC, initial HEP, and DN rational, procedure, outcomes, potential side effects, and recommended post-treatment  exercises/activity  Person educated: Patient Education method: Explanation, Demonstration, and Handouts Education comprehension: verbalized understanding and returned demonstration  HOME EXERCISE PROGRAM: Access Code: [email or web address] URL: [email or web address] Date: 01/05/2023 Prepared by: Mliss  Exercises - Supine Quad Set  - 1 x daily - 7 x weekly - 2 sets - 10 reps - 5 sec hold - Active Straight Leg Raise with Quad Set  - 1 x daily - 7 x weekly - 2 sets - 10 reps - Seated Long Arc Quad  - 1 x daily - 7 x weekly - 2 sets - 10 reps - 5 sec hold - Supine Knee Extension Strengthening  - 1 x daily - 7 x weekly - 2 sets - 10 reps - 5 sec hold - Seated Ankle Plantarflexion Dorsiflexion PROM  - 2 x daily - 7 x weekly - 1 sets - 3 reps - 30-60 sec hold - Supine Hamstring Stretch  - 2 x daily - 7 x weekly - 1 sets - 3 reps - 30 sec hold - Straight Leg Raise with External Rotation  - 1 x daily - 7 x weekly - 2 sets - 10 reps  ASSESSMENT:  CLINICAL IMPRESSION: Pt arrived without pain.  She is able to negotiate steps at home without difficulty or pain.  If walking multiple flights, she experiences pulling  in distal Lt quad.  She will begin full duty this week and plans to wear her brace at work.  MD has released her at this time. Pt completed all exercise without increased pain today.  Patient required verbal and visual cues for correct exercise performance with  select exercises today. Pt will D/C to HEP today.   OBJECTIVE IMPAIRMENTS: decreased activity tolerance, decreased ROM, decreased strength, increased muscle spasms, impaired flexibility, and pain.   ACTIVITY LIMITATIONS: sitting, standing, and locomotion level  PARTICIPATION LIMITATIONS: occupation  PERSONAL FACTORS: 1 comorbidity: DM  are also affecting patient's functional outcome.   REHAB POTENTIAL: Excellent  CLINICAL DECISION MAKING: Evolving/moderate complexity  EVALUATION COMPLEXITY: Low   SHORT TERM GOALS: Target date: 01/14/2023    Ind with initial HEP Baseline: Goal status: MET  2.  Decreased knee pain by 25%. Baseline: intermittent by 80%  Goal status: met   LONG TERM GOALS: Target date: 03/05/23  Ind with advanced HEP and its progression Baseline:  Goal status: MET  2. Pt able to sit and stand without knee pain to allow return to full duty at work Baseline: still light duty, reports 4-6/10 with this at time (02/16/23) Goal status: partially met   3.  Improved LE strength to 5/5 to improve function Baseline:  Goal status: Met  4.  Able to climb stairs without pain Baseline: no pain at home.  With multiple flights at work, some Lt distal quad pain but not limiting Goal status: MET  5.   Improved LEFS to 65 showing functional improvement Baseline: 70 (02/16/23) Goal status:MET   PLAN: PHYSICAL THERAPY DISCHARGE SUMMARY  Visits from Start of Care: 13  Current functional level related to goals / functional outcomes: See above.  Pt reports some pulling in the Lt knee with repetitive movement or walking.  Not limited at this time.     Remaining deficits: See above.     Education / Equipment: HEP, brace use   Patient agrees to discharge. Patient goals were partially met. Patient is being discharged due to being pleased with the current functional level.    Burnard Joy, PT 02/16/23 12:28 PM

## 2023-02-24 ENCOUNTER — Encounter: Payer: Self-pay | Admitting: Internal Medicine

## 2023-02-24 ENCOUNTER — Telehealth: Payer: Self-pay | Admitting: Internal Medicine

## 2023-02-24 ENCOUNTER — Ambulatory Visit: Payer: Self-pay | Admitting: Internal Medicine

## 2023-02-24 NOTE — Telephone Encounter (Signed)
Copied from CRM [id number]. Topic: Clinical - Medication Question >> Feb 24, 2023  9:36 AM Steele Sizer wrote: Reason for CRM: Pt asked if her PCP can provide her with a prescription for Adderall extended release 30 mg, Lexapro 10 MG instead of having to get it from [facility] on [address]

## 2023-02-24 NOTE — Telephone Encounter (Signed)
  Chief Complaint: arm pain Symptoms: numbness and tingling Frequency: ongoing Pertinent Negatives: Patient denies fever, swelling, redness of R arm Disposition: [] ED /[] Urgent Care (no appt availability in office) / [x] Appointment(In office/virtual)/ []  [place] [facility]/ [] Home Care/ [] Refused Recommended Disposition /[] [place] [facility]/ []  Follow-up with PCP Additional Notes: Patient reports R arm nerve pain. Reports that she had history of neuropathy in 2022 and recent back injury at work that exacerbated arm pain at this time. Has tried OTC medications with some relief. Scheduled patient per protocol on 02/24/2023. Patient verbalized understanding and to call back with worsening symptoms.   Reason for Disposition  Numbness (i.e., loss of sensation) in hand or fingers  Answer Assessment - Initial Assessment Questions 1. ONSET: "When did the pain start?"     Previously dx with neuropathy in 2022, recent injury to back that exacerbated arm pain 2. LOCATION: "Where is the pain located?"     Right arm 3. PAIN: "How bad is the pain?" (Scale 1-10; or mild, moderate, severe)   - MILD (1-3): Doesn't interfere with normal activities.   - MODERATE (4-7): Interferes with normal activities (e.g., work or school) or awakens from sleep.   - SEVERE (8-10): Excruciating pain, unable to do any normal activities, unable to hold a cup of water.     Moderate to severe 4. WORK OR EXERCISE: "Has there been any recent work or exercise that involved this part of the body?"     Was on light duty, but just got cleared. Still with physical duty at work. 5. CAUSE: "What do you think is causing the arm pain?"     Work injury to back exacerbating arm pain 6. OTHER SYMPTOMS: "Do you have any other symptoms?" (e.g., neck pain, swelling, rash, fever, numbness, weakness)     Pins and needles, numbness Someone is just squeezing my arm  Protocols used: Arm Pain-A-AH

## 2023-02-25 ENCOUNTER — Other Ambulatory Visit (HOSPITAL_BASED_OUTPATIENT_CLINIC_OR_DEPARTMENT_OTHER): Payer: Self-pay

## 2023-02-25 ENCOUNTER — Other Ambulatory Visit: Payer: Self-pay

## 2023-02-25 ENCOUNTER — Encounter: Payer: Self-pay | Admitting: Internal Medicine

## 2023-02-25 ENCOUNTER — Ambulatory Visit: Payer: Commercial Managed Care - HMO | Admitting: Internal Medicine

## 2023-02-25 VITALS — BP 98/62 | HR 67 | Temp 98.4°F | Ht 62.5 in | Wt 162.1 lb

## 2023-02-25 DIAGNOSIS — R350 Frequency of micturition: Secondary | ICD-10-CM | POA: Insufficient documentation

## 2023-02-25 DIAGNOSIS — M5412 Radiculopathy, cervical region: Secondary | ICD-10-CM | POA: Diagnosis not present

## 2023-02-25 DIAGNOSIS — Z7985 Long-term (current) use of injectable non-insulin antidiabetic drugs: Secondary | ICD-10-CM | POA: Diagnosis not present

## 2023-02-25 DIAGNOSIS — E1169 Type 2 diabetes mellitus with other specified complication: Secondary | ICD-10-CM

## 2023-02-25 DIAGNOSIS — F319 Bipolar disorder, unspecified: Secondary | ICD-10-CM | POA: Insufficient documentation

## 2023-02-25 MED ORDER — INDOMETHACIN 50 MG PO CAPS
50.0000 mg | ORAL_CAPSULE | Freq: Three times a day (TID) | ORAL | 1 refills | Status: DC | PRN
  Filled 2023-02-25: qty 90, 30d supply, fill #0

## 2023-02-25 MED ORDER — TRAMADOL HCL 50 MG PO TABS
50.0000 mg | ORAL_TABLET | Freq: Four times a day (QID) | ORAL | 1 refills | Status: AC | PRN
Start: 2023-02-25 — End: 2023-03-02
  Filled 2023-02-25: qty 20, 5d supply, fill #0

## 2023-02-25 MED ORDER — METHOCARBAMOL 500 MG PO TABS
500.0000 mg | ORAL_TABLET | Freq: Three times a day (TID) | ORAL | 1 refills | Status: DC | PRN
  Filled 2023-02-25: qty 30, 10d supply, fill #0

## 2023-02-25 MED ORDER — KETOROLAC TROMETHAMINE 60 MG/2ML IM SOLN
60.0000 mg | Freq: Once | INTRAMUSCULAR | Status: AC
  Administered 2023-02-25: 60 mg via INTRAMUSCULAR

## 2023-02-25 NOTE — Addendum Note (Signed)
Addended by: Delsa Grana R on: 02/25/2023 11:08 AM   Modules accepted: Orders
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
## 2023-02-25 NOTE — Assessment & Plan Note (Signed)
On Rx 

## 2023-02-25 NOTE — Assessment & Plan Note (Addendum)
New. Pain in the R arm is 10/10 going all the way to the fingers... There is some pain in the left and right leg also. R handed Tramadol prn  Potential benefits of a short term opioids use as well as potential risks (i.e. addiction risk, apnea etc) and complications (i.e. Somnolence, constipation and others) were explained to the patient and were aknowledged. Robaxin prn Indomethacin po Consider steroids Hold Atorvastatin for now

## 2023-02-25 NOTE — Telephone Encounter (Signed)
Patient is aware 

## 2023-02-26 ENCOUNTER — Other Ambulatory Visit: Payer: Commercial Managed Care - HMO

## 2023-02-28 ENCOUNTER — Other Ambulatory Visit: Payer: Commercial Managed Care - HMO

## 2023-03-03 ENCOUNTER — Telehealth: Payer: Self-pay

## 2023-03-03 ENCOUNTER — Encounter: Payer: Self-pay | Admitting: Internal Medicine

## 2023-03-03 NOTE — Telephone Encounter (Signed)
Copied from CRM [id number]. Topic: General - Other >> Mar 03, 2023 11:13 AM Irine Seal wrote: Reason for CRM: orders were placed 02/25/23 for MR Cervical Spine Wo Contrast (Order [id number]) Patient stated she was called and informed her appt was canceled due to insurance denial.  Per the appt notes "[employer] Denied #[id number] due to not completing 6 weeks of directed provider treatment." Patient is needing direction on where to go from here callback [phone]

## 2023-03-03 NOTE — Telephone Encounter (Signed)
I spoke to the patient and she stated " I called Dr. Loren Racer office and I don't know why your office is calling me".

## 2023-03-04 ENCOUNTER — Other Ambulatory Visit (HOSPITAL_BASED_OUTPATIENT_CLINIC_OR_DEPARTMENT_OTHER): Payer: Self-pay

## 2023-03-04 ENCOUNTER — Inpatient Hospital Stay: Admission: RE | Admit: 2023-03-04 | Payer: Commercial Managed Care - HMO | Source: Ambulatory Visit

## 2023-03-04 MED ORDER — FREESTYLE LIBRE 3 PLUS SENSOR MISC
11 refills | Status: AC
  Filled 2023-03-04 – 2023-04-12 (×2): qty 2, 28d supply, fill #0

## 2023-03-05 ENCOUNTER — Telehealth: Payer: Self-pay

## 2023-03-05 ENCOUNTER — Other Ambulatory Visit (HOSPITAL_BASED_OUTPATIENT_CLINIC_OR_DEPARTMENT_OTHER): Payer: Self-pay

## 2023-03-05 MED ORDER — TRULICITY 0.75 MG/0.5ML ~~LOC~~ SOAJ
0.7500 mg | SUBCUTANEOUS | 1 refills | Status: DC
  Filled 2023-03-05 – 2023-06-08 (×2): qty 2, 28d supply, fill #0

## 2023-03-05 NOTE — Telephone Encounter (Signed)
Patient called in our office asking next steps. Patient was seen by [facility] provider for an acute issue but she needs to schedule a follow up with her PCP Ardyth Harps to continue management of the issue.   If patient calls back she needs to schedule OV with PCP to discuss next steps and reorder of the MRI.

## 2023-03-05 NOTE — Telephone Encounter (Signed)
Jasmine Huerta is Dr. Lily Peer Acosta's patient.   Please schedule an appointment with Dr. Patience Musca. Thank you

## 2023-03-05 NOTE — Telephone Encounter (Signed)
Copied from CRM [id number]. Topic: General - Other >> Mar 05, 2023 10:17 AM Pascal Lux wrote: Reason for CRM: Patient stated she was called and informed her appt was canceled due to insurance denial. States that we need to resend MRI paperwork with more details to the insurance company. Patient PCP is at [facility] but because she was told by a nurse to go to [facility] she saw Dr. Posey Rea and would like for his team to review and call her back please.

## 2023-03-06 ENCOUNTER — Emergency Department (HOSPITAL_BASED_OUTPATIENT_CLINIC_OR_DEPARTMENT_OTHER)
Admission: EM | Admit: 2023-03-06 | Discharge: 2023-03-06 | Disposition: A | Discharge status: Home / Self Care | Payer: Commercial Managed Care - HMO | Attending: Emergency Medicine | Admitting: Emergency Medicine

## 2023-03-06 ENCOUNTER — Encounter (HOSPITAL_BASED_OUTPATIENT_CLINIC_OR_DEPARTMENT_OTHER): Payer: Self-pay

## 2023-03-06 ENCOUNTER — Other Ambulatory Visit: Payer: Self-pay

## 2023-03-06 ENCOUNTER — Other Ambulatory Visit (HOSPITAL_BASED_OUTPATIENT_CLINIC_OR_DEPARTMENT_OTHER): Payer: Self-pay

## 2023-03-06 DIAGNOSIS — M79621 Pain in right upper arm: Secondary | ICD-10-CM | POA: Insufficient documentation

## 2023-03-06 DIAGNOSIS — M79601 Pain in right arm: Secondary | ICD-10-CM

## 2023-03-06 LAB — PREGNANCY, URINE: Preg Test, Ur: NEGATIVE

## 2023-03-06 MED ORDER — KETOROLAC TROMETHAMINE 10 MG PO TABS
10.0000 mg | ORAL_TABLET | Freq: Four times a day (QID) | ORAL | 0 refills | Status: DC | PRN
  Filled 2023-03-06: qty 20, 5d supply, fill #0

## 2023-03-06 MED ORDER — KETOROLAC TROMETHAMINE 60 MG/2ML IM SOLN
30.0000 mg | Freq: Once | INTRAMUSCULAR | Status: AC
Start: 2023-03-06 — End: 2023-03-06
  Administered 2023-03-06: 30 mg via INTRAMUSCULAR
  Filled 2023-03-06: qty 2

## 2023-03-06 MED ORDER — HYDROCODONE-ACETAMINOPHEN 5-325 MG PO TABS
1.0000 | ORAL_TABLET | Freq: Every evening | ORAL | 0 refills | Status: DC | PRN
  Filled 2023-03-06: qty 5, 5d supply, fill #0

## 2023-03-06 NOTE — Discharge Instructions (Signed)
You have been seen and discharged from the emergency department.  You were given a shot of Toradol.  Discontinue your Indocin as your prescription has been changed to oral Toradol.  You have also been prescribed a stronger pain medicine to take at night if needed.  Do not mix this medication with alcohol or other sedating medications. Do not drive or do heavy physical activity until you know how this medication affects you.  It may cause drowsiness.  You may use your previously prescribed muscle relaxer as prescribed and as needed.  Follow-up with your primary provider for further evaluation, MRI and further care. Take home medications as prescribed. If you have any worsening symptoms or further concerns for your health please return to an emergency department for further evaluation.

## 2023-03-06 NOTE — ED Provider Notes (Signed)
[place] EMERGENCY DEPARTMENT AT [facility] Provider Note   CSN: [id number] Arrival date & time: 03/06/23  4098     History  Chief Complaint  Patient presents with   Tingling   Arm Pain    right    Jasmine Huerta is a 38 y.o. female.  HPI   38 year old female presents emergency department with pain in the right upper extremity, ongoing for couple weeks.  Patient is following with her primary doctor as an outpatient, is planned for MRI of the cervical spine next week.  Here with worsening symptoms.  Has been prescribed indomethacin and Robaxin as an outpatient.  She has not been taking the muscle relaxer because "she is scared of certain medications".  States that the Indocin makes her drowsy.  The main medicine that has helped was a shot of Toradol a couple weeks ago.  She denies any numbness or weakness of the upper extremity.  Describes a tingling/burning sensation starting from the right side of the neck all the way out to the tips of the right upper extremity.  Denies any other associated symptoms including headache, vision changes, focal weakness, numbness.  There has been no right hand discoloration.  Home Medications Prior to Admission medications   Medication Sig Start Date End Date Taking? Authorizing Provider  amphetamine-dextroamphetamine (ADDERALL XR) 30 MG 24 hr capsule Take 1 capsule (30 mg total) by mouth every morning. 01/11/23     cetirizine (ZYRTEC) 10 MG tablet Take 1 tablet (10 mg total) by mouth daily as needed for allergies. 07/28/22   Philip Aspen, Limmie Patricia, MD  Continuous Glucose Sensor (FREESTYLE LIBRE 3 PLUS SENSOR) MISC Use 1 sensor every 14 days. 03/04/23     Continuous Glucose Sensor (FREESTYLE LIBRE 3 SENSOR) MISC Inject 1 each into the skin every 14 (fourteen) days. 06/15/22     dicyclomine (BENTYL) 20 MG tablet TAKE 1 TABLET (20 MG TOTAL) BY MOUTH 3 (THREE) TIMES DAILY AS NEEDED FOR SPASMS. 12/30/21   Philip Aspen, Limmie Patricia, MD  Dulaglutide  (TRULICITY) 0.75 MG/0.5ML SOAJ Inject 0.75 mg into the skin every 7 (seven) days. 03/05/23     escitalopram (LEXAPRO) 10 MG tablet Take 1 tablet (10 mg total) by mouth Nightly. 12/08/22     escitalopram (LEXAPRO) 10 MG tablet Take 1 tablet (10 mg total) by mouth every night 01/11/23     indomethacin (INDOCIN) 50 MG capsule Take 1 capsule (50 mg total) by mouth 3 (three) times daily with meals as needed. 02/25/23   Plotnikov, Georgina Quint, MD  methocarbamol (ROBAXIN) 500 MG tablet Take 1 tablet (500 mg total) by mouth every 8 (eight) hours as needed for muscle spasms. 02/25/23   Plotnikov, Georgina Quint, MD  pantoprazole (PROTONIX) 40 MG tablet Take 1 tablet (40 mg total) by mouth daily. 10/19/22   Philip Aspen, Limmie Patricia, MD  Semaglutide, 1 MG/DOSE, (OZEMPIC, 1 MG/DOSE,) 4 MG/3ML SOPN Inject 1 mg into the skin once a week. 02/26/22     Semaglutide, 1 MG/DOSE, (OZEMPIC, 1 MG/DOSE,) 4 MG/3ML SOPN Inject 1 mg into the skin once a week. 06/15/22     Semaglutide, 1 MG/DOSE, (OZEMPIC, 1 MG/DOSE,) 4 MG/3ML SOPN Inject 1 mg into the skin once a week. 09/24/22         Allergies    Patient has no known allergies.    Review of Systems   Review of Systems  Constitutional:  Negative for fever.  Eyes:  Negative for visual disturbance.  Respiratory:  Negative  for shortness of breath.   Cardiovascular:  Negative for chest pain.  Gastrointestinal:  Negative for abdominal pain, diarrhea and vomiting.  Musculoskeletal:  Positive for neck pain. Negative for back pain.       +RUE burning pain  Skin:  Negative for rash.  Neurological:  Negative for dizziness, facial asymmetry, speech difficulty, weakness and headaches.    Physical Exam Updated Vital Signs BP (!) 124/91 (BP Location: Left Arm)   Pulse 74   Temp 98.3 F (36.8 C) (Oral)   Resp 20   Ht 5' 2.5" (1.588 m)   Wt 73.2 kg   LMP 02/15/2023   SpO2 100%   BMI 29.05 kg/m  Physical Exam Vitals and nursing note reviewed.  Constitutional:      Appearance:  Normal appearance.  HENT:     Head: Normocephalic.     Mouth/Throat:     Mouth: Mucous membranes are moist.  Cardiovascular:     Rate and Rhythm: Normal rate.  Pulmonary:     Effort: Pulmonary effort is normal. No respiratory distress.  Abdominal:     Palpations: Abdomen is soft.     Tenderness: There is no abdominal tenderness.  Musculoskeletal:     Comments: Palpable right radial pulse, right hand is neurovascularly intact, worsening tingling/burning with deep palpation of the right trapezius muscle.  No rash or skin changes noted.  Skin:    General: Skin is warm.  Neurological:     Mental Status: She is alert and oriented to person, place, and time. Mental status is at baseline.  Psychiatric:        Mood and Affect: Mood normal.     ED Results / Procedures / Treatments   Labs (all labs ordered are listed, but only abnormal results are displayed) Labs Reviewed  PREGNANCY, URINE    EKG None  Radiology No results found.  Procedures Procedures    Medications Ordered in ED Medications - No data to display  ED Course/ Medical Decision Making/ A&P                                 Medical Decision Making Amount and/or Complexity of Data Reviewed Labs: ordered.  Risk Prescription drug management.   37 year old female presents emergency department with tingling/burning pain sensation in the right upper extremity.  Ongoing for couple weeks. No other associated neuro symptoms or red flags. Being evaluated by her primary doctor, plan for outpatient MRI of the cervical spine.  Had been given Robaxin but not taking it due to being scared medications.  Physical exam is very reassuring.  Arm is neurovascularly intact.  She has a lot of tenderness to palpation of the right trapezius muscle which makes me think there is a musculoskeletal origin to this.  Discussed with patient safety and proper use of muscle relaxer.  She was not having great relief with the Indocin and it  was causing her to be drowsy so we will change this to oral Toradol.  She is also requesting a Toradol shot here in the department which is appropriate.  Pain is causing difficulty with sleep, will prescribe a stronger pain medicine to use at night if needed.  Otherwise she is scheduled outpatient follow-up for reevaluation and pursuing MRI.  No indication for emergent imaging at this time.  Symptoms seem peripheral.  Patient at this time appears safe and stable for discharge and close outpatient follow up. Discharge plan  and strict return to ED precautions discussed, patient verbalizes understanding and agreement.        Final Clinical Impression(s) / ED Diagnoses Final diagnoses:  None    Rx / DC Orders ED Discharge Orders     None         Rozelle Logan, DO 03/06/23 1315

## 2023-03-06 NOTE — ED Triage Notes (Signed)
Patient arrives POV with complaints of right arm tingling and pain for ~1 week. Patient states that she seen by her PCP and given prescription meds with minimal relief. Rates her arm pain a 10/10.

## 2023-03-08 NOTE — Telephone Encounter (Signed)
Appointment scheduled 03/10/23.

## 2023-03-10 ENCOUNTER — Ambulatory Visit: Payer: Commercial Managed Care - HMO | Admitting: Internal Medicine

## 2023-03-10 ENCOUNTER — Other Ambulatory Visit (HOSPITAL_BASED_OUTPATIENT_CLINIC_OR_DEPARTMENT_OTHER): Payer: Self-pay

## 2023-03-10 ENCOUNTER — Encounter: Payer: Self-pay | Admitting: Internal Medicine

## 2023-03-10 VITALS — BP 102/70 | HR 64 | Temp 98.3°F | Wt 166.4 lb

## 2023-03-10 DIAGNOSIS — M542 Cervicalgia: Secondary | ICD-10-CM

## 2023-03-10 DIAGNOSIS — R200 Anesthesia of skin: Secondary | ICD-10-CM | POA: Diagnosis not present

## 2023-03-10 DIAGNOSIS — R202 Paresthesia of skin: Secondary | ICD-10-CM

## 2023-03-10 MED ORDER — MELOXICAM 7.5 MG PO TABS
7.5000 mg | ORAL_TABLET | Freq: Every day | ORAL | 0 refills | Status: DC
  Filled 2023-03-10: qty 30, 30d supply, fill #0

## 2023-03-10 NOTE — Progress Notes (Signed)
 Established Patient Office Visit     CC/Reason for Visit: Right neck and arm pain  HPI: Jasmine Huerta is a 38 y.o. female who is coming in today for the above mentioned reasons.  She has been dealing with this issue for about a month.  She started having right-sided neck and trapezius pain.  Has progressed to having numbness and tingling down into her fingertips.  She also notes at times pain in her right thigh.  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  She is having some difficulty stirring what she cooks on the stove top.  She had seen another provider for this issue who recommended a cervical MRI, however insurance denied stating that she needed to have 6 weeks of physical therapy beforehand.   Past Medical/Surgical History: Past Medical History:  Diagnosis Date   Anxiety    Arthritis    My Dr said I do , I dont know.   Bipolar disorder [facility]) age 50   Depression    GDM (gestational diabetes mellitus)    history of GDM   GERD (gastroesophageal reflux disease)    Headache    migraines - 07/02/17-  none recently   Pre-diabetes     Past Surgical History:  Procedure Laterality Date   CHOLECYSTECTOMY N/A 07/05/2017   Procedure: LAPAROSCOPIC CHOLECYSTECTOMY;  Surgeon: Signe Mitzie LABOR, MD;  Location: [facility] OR;  Service: General;  Laterality: N/A;   TUBAL LIGATION  09/17/2011   Procedure: POST PARTUM TUBAL LIGATION;  Surgeon: Jon CINDERELLA Rummer, MD;  Location: [facility] ORS;  Service: Gynecology;  Laterality: Bilateral;   WISDOM TOOTH EXTRACTION      Social History:  reports that she has never smoked. She has never used smokeless tobacco. She reports that she does not drink alcohol and does not use drugs.  Allergies: No Known Allergies  Family History:  Family History  Problem Relation Age of Onset   Heart disease Sister        whole in heart at birth   Anemia Mother    Hypertension Mother     Diabetes Mother    Thyroid disease Mother    Hypertension Maternal Grandfather    Anemia Daughter    Anemia Sister    Asthma Sister    Diabetes Maternal Uncle    Diabetes Maternal Grandmother    Thyroid disease Cousin    Seizures Sister      Current Outpatient Medications:    amphetamine -dextroamphetamine  (ADDERALL XR) 30 MG 24 hr capsule, Take 1 capsule (30 mg total) by mouth every morning., Disp: 30 capsule, Rfl: 0   cetirizine  (ZYRTEC ) 10 MG tablet, Take 1 tablet (10 mg total) by mouth daily as needed for allergies., Disp: 90 tablet, Rfl: 1   Continuous Glucose Sensor (FREESTYLE LIBRE 3 PLUS SENSOR) MISC, Use 1 sensor every 14 days., Disp: 2 each, Rfl: 11   Continuous Glucose Sensor (FREESTYLE LIBRE 3 SENSOR) MISC, Inject 1 each into the skin every 14 (fourteen) days., Disp: 2 each, Rfl: 11   dicyclomine  (BENTYL ) 20 MG tablet, TAKE 1 TABLET (20 MG TOTAL) BY MOUTH 3 (THREE) TIMES DAILY AS NEEDED FOR SPASMS., Disp: 270 tablet, Rfl: 1   Dulaglutide  (TRULICITY ) 0.75 MG/0.5ML SOAJ, Inject 0.75 mg into the skin every 7 (seven) days., Disp: 2 mL, Rfl: 1   meloxicam  (MOBIC ) 7.5 MG tablet, Take 1 tablet (7.5 mg total) by mouth daily., Disp: 30 tablet, Rfl:  0   methocarbamol  (ROBAXIN ) 500 MG tablet, Take 1 tablet (500 mg total) by mouth every 8 (eight) hours as needed for muscle spasms., Disp: 30 tablet, Rfl: 1   pantoprazole  (PROTONIX ) 40 MG tablet, Take 1 tablet (40 mg total) by mouth daily., Disp: 90 tablet, Rfl: 1  Review of Systems:  Negative unless indicated in HPI.   Physical Exam: Vitals:   03/10/23 0758  BP: 102/70  Pulse: 64  Temp: 98.3 F (36.8 C)  TempSrc: Oral  SpO2: 99%  Weight: 166 lb 6.4 oz (75.5 kg)    Body mass index is 29.95 kg/m.   Physical Exam Neurological:     General: No focal deficit present.     Mental Status: She is oriented to person, place, and time.  Psychiatric:        Mood and Affect: Mood normal.        Behavior: Behavior normal.         Thought Content: Thought content normal.        Judgment: Judgment normal.      Impression and Plan:  Neck pain -     Ambulatory referral to Physical Therapy -     Meloxicam ; Take 1 tablet (7.5 mg total) by mouth daily.  Dispense: 30 tablet; Refill: 0  Numbness and tingling of right arm -     Ambulatory referral to Physical Therapy -     Meloxicam ; Take 1 tablet (7.5 mg total) by mouth daily.  Dispense: 30 tablet; Refill: 0   -I think this is likely to be cervical radiculopathy but agree that a course of physical therapy is not unreasonable ahead of MRI.  I am concerned somewhat about her decreased grip strength.  Will go ahead and start physical therapy, meloxicam  during the daytime and muscle relaxer Robaxin  that she already has prescribed for nighttime.  If she fails to improve with physical therapy I would say that waiting 6 weeks could be dangerous and would advocate for a sooner MRI.   Time spent:31 minutes reviewing chart, interviewing and examining patient and formulating plan of care.     Tully Theophilus Andrews, MD [facility]

## 2023-03-11 ENCOUNTER — Ambulatory Visit: Payer: Commercial Managed Care - HMO | Attending: Internal Medicine | Admitting: Physical Therapy

## 2023-03-11 ENCOUNTER — Encounter: Payer: Self-pay | Admitting: Physical Therapy

## 2023-03-11 ENCOUNTER — Other Ambulatory Visit: Payer: Self-pay

## 2023-03-11 DIAGNOSIS — M542 Cervicalgia: Secondary | ICD-10-CM | POA: Insufficient documentation

## 2023-03-11 DIAGNOSIS — M79602 Pain in left arm: Secondary | ICD-10-CM | POA: Insufficient documentation

## 2023-03-11 DIAGNOSIS — R252 Cramp and spasm: Secondary | ICD-10-CM | POA: Insufficient documentation

## 2023-03-11 DIAGNOSIS — R293 Abnormal posture: Secondary | ICD-10-CM | POA: Insufficient documentation

## 2023-03-11 DIAGNOSIS — R202 Paresthesia of skin: Secondary | ICD-10-CM | POA: Diagnosis not present

## 2023-03-11 DIAGNOSIS — R200 Anesthesia of skin: Secondary | ICD-10-CM | POA: Diagnosis not present

## 2023-03-11 DIAGNOSIS — M79601 Pain in right arm: Secondary | ICD-10-CM | POA: Diagnosis present

## 2023-03-11 NOTE — Therapy (Signed)
 OUTPATIENT PHYSICAL THERAPY CERVICAL EVALUATION   Patient Name: Jasmine Huerta MRN: [id number] DOB:1985/08/05, 38 y.o., female Today's Date: 03/11/2023  END OF SESSION:  PT End of Session - 03/11/23 1356     Visit Number 1    Date for PT Re-Evaluation 04/22/23    Authorization Type [employer]    PT Start Time 0840    PT Stop Time 0920    PT Time Calculation (min) 40 min    Activity Tolerance Patient tolerated treatment well    Behavior During Therapy [facility] for tasks assessed/performed             Past Medical History:  Diagnosis Date   Anxiety    Arthritis    My Dr said I do , I dont know.   Bipolar disorder [facility]) age 33   Depression    GDM (gestational diabetes mellitus)    history of GDM   GERD (gastroesophageal reflux disease)    Headache    migraines - 07/02/17-  none recently   Pre-diabetes    Past Surgical History:  Procedure Laterality Date   CHOLECYSTECTOMY N/A 07/05/2017   Procedure: LAPAROSCOPIC CHOLECYSTECTOMY;  Surgeon: Signe Mitzie LABOR, MD;  Location: [facility] OR;  Service: General;  Laterality: N/A;   TUBAL LIGATION  09/17/2011   Procedure: POST PARTUM TUBAL LIGATION;  Surgeon: Jon CINDERELLA Rummer, MD;  Location: [facility] ORS;  Service: Gynecology;  Laterality: Bilateral;   WISDOM TOOTH EXTRACTION     Patient Active Problem List   Diagnosis Date Noted   Bipolar disorder (HCC) 02/25/2023   Increased frequency of urination 02/25/2023   Cervical radiculitis 02/25/2023   Hyperlipidemia associated with type 2 diabetes mellitus (HCC) 12/03/2021   Vitamin D  deficiency 12/03/2021   DM (diabetes mellitus), type 2 (HCC) 06/07/2018   NSVD (normal spontaneous vaginal delivery) 09/16/2011   Noncompliance with medications 08/25/2011   Noncompliance of patient with dietary regimen 08/25/2011   GDM (gestational diabetes mellitus) 08/25/2011   Gestational diabetes 07/16/2011   H/O gestational diabetes in prior pregnancy, currently pregnant 06/10/2011    PCP: Theophilus Andrews, Tully CINDERELLA, MD  REFERRING PROVIDER: Theophilus Andrews, Tully CINDERELLA, MD  REFERRING DIAG: M54.2 (ICD-10-CM) - Neck pain R20.0,R20.2 (ICD-10-CM) - Numbness and tingling of right arm  THERAPY DIAG:  Cervicalgia  Pain in right arm  Pain in left arm  Cramp and spasm  Abnormal posture  Rationale for Evaluation and Treatment: Rehabilitation  ONSET DATE: February 17 2023  SUBJECTIVE:  SUBJECTIVE STATEMENT: Patient presents with increased neck pain that radiates down her right arm that began January 2025. She was helping a patient out of bed and she felt the pain afterwards in her neck and down her Rt arm.  The pain comes in waves but pain is constant. It is a pins and needles sensation in her Rt arm and an achy pain in her neck area. She feels her right arm is weaker and she can barely write because of the pain. Neck pain mainly on the right side but she will occasionally feel pain on the Lt . She is not currently sleeping well she wakes up 4-5x a night. She sleeps with a heating pad to help with sleep. Toradol  shots did help but the pain was still there. She is currently on light duty on work; lifting < 10lbs. When she went to her PCP 03/10/2023 she stretched her neck and that made her pain worst. April 2024 she had a workplace injury and injured her back and she feels that injury may have started her symptoms. Hand dominance: Right  PERTINENT HISTORY:  Anxiety; headaches; pre-diabetes; bipolar  PAIN:  Are you having pain? Yes: NPRS scale: 6(currently) 10+ (worst) Pain location: Right side of neck; down full Rt arm Pain description: pins & needles; achy Aggravating factors: laying on her back; laying on her right side; staying in same position for long periods of time Relieving factors: Nothing  PRECAUTIONS:  None  RED FLAGS: None     WEIGHT BEARING RESTRICTIONS: No  FALLS:  Has patient fallen in last 6 months? 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  DIAGNOSTIC FINDINGS:  None  PATIENT SURVEYS:  NDI 36-50 72%  COGNITION: Overall cognitive status: Within functional limits for tasks assessed  SENSATION: Pins & needles down Rt arm  POSTURE: rounded shoulders and forward head  PALPATION: Increased muscle spasms & tenderness with bilateral upper trap palpation Increased muscle spasms of sub occipital palpation   CERVICAL ROM: *pain  Active ROM A/PROM (deg) eval  Flexion 40  Extension 45  Right lateral flexion 30*  Left lateral flexion 35*  Right rotation 55  Left rotation 55   (Blank rows = not tested)  UPPER EXTREMITY ROM: WFL no pain   UPPER EXTREMITY MMT:*pain  MMT Right eval Left eval  Shoulder flexion 4* 4+  Shoulder extension    Shoulder abduction 4-* 4+  Shoulder adduction    Shoulder extension    Shoulder internal rotation    Shoulder external rotation    Middle trapezius    Lower trapezius    Elbow flexion 4 4+  Elbow extension 4 4+  Wrist flexion    Wrist extension    Wrist ulnar deviation    Wrist radial deviation    Wrist pronation    Wrist supination    Grip strength 30lbs 50lbs   (Blank rows = not tested)  CERVICAL SPECIAL TESTS:  Upper limb tension test (ULTT): Positive and Distraction test: Positive (+) Median & Ulnar Nerve ; (-) Radial N on Rt   FUNCTIONAL TESTS:  5 times sit to stand: 10.65sec no UE support  TREATMENT DATE:  03/11/2023 Initial Evaluation &  HEP created                                                                                                                                 PATIENT EDUCATION:  Education details: DN handout; HEP; POC Person educated: Patient Education method: Explanation, Demonstration, and Handouts Education comprehension: verbalized understanding, returned demonstration, and needs further education  HOME EXERCISE PROGRAM: Access Code: [email or web address] URL: [email or web address] Date: 03/11/2023 Prepared by: Kristeen Sar  Exercises - Median Nerve Flossing  - 1 x daily - 7 x weekly - 1 sets - 10 reps - Ulnar Nerve Flossing  - 1 x daily - 7 x weekly - 1 sets - 10 reps - Hooklying Neck Distraction and Traction  - 1 x daily - 7 x weekly - 1 sets - 5 reps - 10-15 hold  ASSESSMENT:  CLINICAL IMPRESSION: Patient is a 38 y.o. female who was seen today for physical therapy evaluation and treatment for neck pain radiating down right arm. Mata presents to therapy with increased neck pain that radiates down her Rt arm that began January 2025. She is currently on light duty at work due to her increased pain and grip limitations. Based on evaluation noted decreased ROM, muscle weakness, grip strength imbalances and poor posture. Patient's upper limb tension tests were positive for the Median and ulnar nerve on the Right. She felt relief with cervical distraction and verbalized a decreased in pin and needles symptoms. Educated patient on the benefit of dry needling to address her muscle spasms. Patient is motivated and wants to get better. Overall, at end of treatment session patient verbalized mild decrease in pins and needles sensation after nerve flossing. Patient will benefit from skilled PT to address the below impairments and improve overall function.    OBJECTIVE IMPAIRMENTS: decreased activity tolerance, decreased ROM, decreased strength, increased fascial restrictions, impaired perceived functional  ability, increased muscle spasms, impaired sensation, impaired UE functional use, postural dysfunction, and pain.   ACTIVITY LIMITATIONS: carrying, lifting, sleeping, reach over head, and caring for others  PARTICIPATION LIMITATIONS: cleaning, laundry, driving, community activity, and occupation  PERSONAL FACTORS: Time since onset of injury/illness/exacerbation and 3+ comorbidities: anxiety; bipolar ;pre-diabetes  are also affecting patient's functional outcome.   REHAB POTENTIAL: Good  CLINICAL DECISION MAKING: Evolving/moderate complexity  EVALUATION COMPLEXITY: Moderate   GOALS: Goals reviewed with patient? Yes  SHORT TERM GOALS: Target date: 04/01/2023  Patient will be independent with initial HEP. Baseline:  Goal status: INITIAL  2.  Patient will report > or = to 30% improvement in cervical radicular symptoms since starting PT. Baseline:  Goal status: INITIAL   LONG TERM GOALS: Target date: 04/22/2023  Patient will demonstrate independence in advanced HEP. Baseline:  Goal status: INITIAL  2.  Patient will report > or = to 50% improvement in cervical radicular symptoms since starting PT. Baseline:  Goal status: INITIAL  3.  Patient  will verbalize and demonstrate self-care strategies to manage pain including tissue mobility practices and change of position. Baseline:  Goal status: INITIAL  4.  Patient will score < or = to 31/50 on NDI for decreased self perceived disability. Baseline: 36/50 Goal status: INITIAL     5.  Patient will be able to return to full work duties. Baseline:  Goal status: INITIAL   PLAN:  PT FREQUENCY: 1x/week  PT DURATION: 6 weeks  PLANNED INTERVENTIONS: 97164- PT Re-evaluation, 97110-Therapeutic exercises, 97530- Therapeutic activity, [id number]- Neuromuscular re-education, 97535- Self Care, [id number]- Manual therapy, [id number]- Canalith repositioning, [id number]- Aquatic Therapy, 97014- Electrical stimulation (unattended), [id number]- Electrical  stimulation (manual), [id number]- Vasopneumatic device, [id number]- Ultrasound, [id number]- Traction (mechanical), [id number]- Ionotophoresis 4mg /ml Dexamethasone , Patient/Family education, Balance training, Stair training, Taping, Dry Needling, Joint mobilization, Joint manipulation, Spinal manipulation, Spinal mobilization, Vestibular training, Cryotherapy, and Moist heat  PLAN FOR NEXT SESSION: review HEP; cervical traction?; UBE; DN upper trap & sub- occipitals    Kristeen Sar, PT 03/11/23 1:57 PM [facility] [address] [place], [place] [address] Phone # [phone] Fax [phone]

## 2023-03-11 NOTE — Patient Instructions (Signed)

## 2023-03-12 ENCOUNTER — Other Ambulatory Visit (HOSPITAL_BASED_OUTPATIENT_CLINIC_OR_DEPARTMENT_OTHER): Payer: Self-pay

## 2023-03-15 ENCOUNTER — Ambulatory Visit: Payer: Commercial Managed Care - HMO

## 2023-03-15 DIAGNOSIS — R293 Abnormal posture: Secondary | ICD-10-CM

## 2023-03-15 DIAGNOSIS — M79602 Pain in left arm: Secondary | ICD-10-CM

## 2023-03-15 DIAGNOSIS — M542 Cervicalgia: Secondary | ICD-10-CM | POA: Diagnosis not present

## 2023-03-15 DIAGNOSIS — M79601 Pain in right arm: Secondary | ICD-10-CM

## 2023-03-15 DIAGNOSIS — R252 Cramp and spasm: Secondary | ICD-10-CM

## 2023-03-15 NOTE — Therapy (Signed)
 OUTPATIENT PHYSICAL THERAPY CERVICAL EVALUATION   Patient Name: Jasmine Huerta MRN: [id number] DOB:1986-01-02, 38 y.o., female Today's Date: 03/15/2023  END OF SESSION:  PT End of Session - 03/15/23 0925     Visit Number 2    Date for PT Re-Evaluation 04/22/23    Authorization Type [employer]    PT Start Time 0847    PT Stop Time 0934    PT Time Calculation (min) 47 min    Activity Tolerance Patient tolerated treatment well    Behavior During Therapy WFL for tasks assessed/performed              Past Medical History:  Diagnosis Date   Anxiety    Arthritis    "My Dr said I do , I dont know."   Bipolar disorder (HCC) age 55   Depression    GDM (gestational diabetes mellitus)    history of GDM   GERD (gastroesophageal reflux disease)    Headache    migraines - 07/02/17-  none recently   Pre-diabetes    Past Surgical History:  Procedure Laterality Date   CHOLECYSTECTOMY N/A 07/05/2017   Procedure: LAPAROSCOPIC CHOLECYSTECTOMY;  Surgeon: Adalberto Acton, MD;  Location: [facility];  Service: General;  Laterality: N/A;   TUBAL LIGATION  09/17/2011   Procedure: POST PARTUM TUBAL LIGATION;  Surgeon: Madelene Schanz, MD;  Location: [facility];  Service: Gynecology;  Laterality: Bilateral;   WISDOM TOOTH EXTRACTION     Patient Active Problem List   Diagnosis Date Noted   Bipolar disorder (HCC) 02/25/2023   Increased frequency of urination 02/25/2023   Cervical radiculitis 02/25/2023   Hyperlipidemia associated with type 2 diabetes mellitus (HCC) 12/03/2021   Vitamin D  deficiency 12/03/2021   DM (diabetes mellitus), type 2 (HCC) 06/07/2018   NSVD (normal spontaneous vaginal delivery) 09/16/2011   Noncompliance with medications 08/25/2011   Noncompliance of patient with dietary regimen 08/25/2011   GDM (gestational diabetes mellitus) 08/25/2011   Gestational diabetes 07/16/2011   H/O gestational diabetes in prior pregnancy, currently pregnant 06/10/2011    PCP: Zilphia Hilt,  Charyl Coppersmith, MD  REFERRING PROVIDER: Zilphia Hilt, Charyl Coppersmith, MD  REFERRING DIAG: M54.2 (ICD-10-CM) - Neck pain R20.0,R20.2 (ICD-10-CM) - Numbness and tingling of right arm  THERAPY DIAG:  Cervicalgia  Pain in right arm  Pain in left arm  Cramp and spasm  Abnormal posture  Rationale for Evaluation and Treatment: Rehabilitation  ONSET DATE: February 17 2023  SUBJECTIVE:  SUBJECTIVE STATEMENT: My Rt neck is very stiff and sore.    From evaluation:  Patient presents with increased neck pain that radiates down her right arm that began January 2025. She was helping a patient out of bed and she felt the pain afterwards in her neck and down her Rt arm.  The pain comes in waves but pain is constant. It is a pins and needles sensation in her Rt arm and an achy pain in her neck area. She feels her right arm is weaker and she can barely write because of the pain. Neck pain mainly on the right side but she will occasionally feel pain on the Lt . She is not currently sleeping well she wakes up 4-5x a night. She sleeps with a heating pad to help with sleep. Toradol  shots did help but the pain was still there. She is currently on light duty on work; lifting < 10lbs. When she went to her PCP 03/10/2023 she stretched her neck and that made her pain worst. April 2024 she had a workplace injury and injured her back and she feels that injury may have started her symptoms. Hand dominance: Right  PERTINENT HISTORY:  Anxiety; headaches; pre-diabetes; bipolar  PAIN: 03/15/23 Are you having pain? Yes: NPRS scale: 7/10 Pain location: Right side of neck; down full Rt arm, Lt neck Pain description: pins & needles; achy Aggravating factors: laying on her back; laying on her right side; staying in same position for  long periods of time Relieving factors: Nothing  PRECAUTIONS: None  RED FLAGS: None    WEIGHT BEARING RESTRICTIONS: No  FALLS:  Has patient fallen in last 6 months? 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  DIAGNOSTIC FINDINGS:  None  PATIENT SURVEYS:  NDI 36-50 72%  COGNITION: Overall cognitive status: Within functional limits for tasks assessed  SENSATION: Pins & needles down Rt arm  POSTURE: rounded shoulders and forward head  PALPATION: Increased muscle spasms & tenderness with bilateral upper trap palpation Increased muscle spasms of sub occipital palpation   CERVICAL ROM: *pain  Active ROM A/PROM (deg) eval  Flexion 40  Extension 45  Right lateral flexion 30*  Left lateral flexion 35*  Right rotation 55  Left rotation 55   (Blank rows = not tested)  UPPER EXTREMITY ROM: WFL no pain   UPPER EXTREMITY MMT:*pain  MMT Right eval Left eval  Shoulder flexion 4* 4+  Shoulder extension    Shoulder abduction 4-* 4+  Shoulder adduction    Shoulder extension    Shoulder internal rotation    Shoulder external rotation    Middle trapezius    Lower trapezius    Elbow flexion 4 4+  Elbow extension 4 4+  Wrist flexion    Wrist extension    Wrist ulnar deviation    Wrist radial deviation    Wrist pronation    Wrist supination    Grip strength 30lbs 50lbs   (Blank rows = not tested)  CERVICAL SPECIAL TESTS:  Upper limb tension test (ULTT): Positive and Distraction test: Positive (+) Median & Ulnar Nerve ; (-) Radial N on Rt  FUNCTIONAL TESTS:  5 times sit to stand: 10.65sec no  UE support  TREATMENT DATE:  03/15/23:  review of HEP- max verbal and demo cues for median and ulnar nerve glides Arm Bike: Level 1x 6 min (3/3) Trigger Point Dry Needling  Initial Treatment: Pt instructed on Dry Needling rational, procedures, and possible side effects. Pt instructed to expect mild to moderate muscle soreness later in the day and/or into the next day.  Pt instructed in methods to reduce muscle soreness. Pt instructed to continue prescribed HEP. Because Dry Needling was performed over or adjacent to a lung field, pt was educated on S/S of pneumothorax and to seek immediate medical attention should they occur.  Patient was educated on signs and symptoms of infection and other risk factors and advised to seek medical attention should they occur.  Patient verbalized understanding of these instructions and education.   Patient Verbal Consent Given: Yes Education Handout Provided: Yes Muscles Treated: Bil upper traps and bil cervical multifidi  Electrical Stimulation Performed: No Treatment Response/Outcome: twitch and improved tissue mobility  Elongation and release to treated tissues  Cervical traction: 15/5# 60/10 seconds x10 min  03/11/2023 Initial Evaluation & HEP created                                                                                                                               PATIENT EDUCATION:  Education details: DN handout; HEP; POC Person educated: Patient Education method: Explanation, Demonstration, and Handouts Education comprehension: verbalized understanding, returned demonstration, and needs further education  HOME EXERCISE PROGRAM: Access Code: [email or web address] URL: [email or web address] Date: 03/11/2023 Prepared by: Penelope Bowie  Exercises - Median Nerve Flossing  - 1 x daily - 7 x weekly - 1 sets - 10 reps - Ulnar Nerve Flossing  - 1 x daily - 7 x weekly - 1 sets - 10 reps - Hooklying Neck Distraction and Traction  - 1 x  daily - 7 x weekly - 1 sets - 5 reps - 10-15 hold  ASSESSMENT:  CLINICAL IMPRESSION: First time follow-up after evaluation.  Pt required max verbal and tactile cues for nerve glides. Pt fatigued quickly with arm bike today and required a rest break.  Good response to dry needling with twitch response and improved tissue mobility and reduced pain after.  Good response to cervical traction. PT monitored throughout session for symptoms. Patient will benefit from skilled PT to address the below impairments and improve overall function.   OBJECTIVE IMPAIRMENTS: decreased activity tolerance, decreased ROM, decreased strength, increased fascial restrictions, impaired perceived functional ability, increased muscle spasms, impaired sensation, impaired UE functional use, postural dysfunction, and pain.   ACTIVITY LIMITATIONS: carrying, lifting, sleeping, reach over head, and caring for others  PARTICIPATION LIMITATIONS: cleaning, laundry, driving, community activity, and occupation  PERSONAL FACTORS: Time since onset of injury/illness/exacerbation and 3+ comorbidities: anxiety; bipolar ;pre-diabetes  are also affecting patient's functional outcome.   REHAB POTENTIAL: Good  CLINICAL DECISION  MAKING: Evolving/moderate complexity  EVALUATION COMPLEXITY: Moderate   GOALS: Goals reviewed with patient? Yes  SHORT TERM GOALS: Target date: 04/01/2023  Patient will be independent with initial HEP. Baseline:  Goal status: INITIAL  2.  Patient will report > or = to 30% improvement in cervical radicular symptoms since starting PT. Baseline:  Goal status: INITIAL   LONG TERM GOALS: Target date: 04/22/2023  Patient will demonstrate independence in advanced HEP. Baseline:  Goal status: INITIAL  2.  Patient will report > or = to 50% improvement in cervical radicular symptoms since starting PT. Baseline:  Goal status: INITIAL  3.  Patient will verbalize and demonstrate self-care strategies to manage  pain including tissue mobility practices and change of position. Baseline:  Goal status: INITIAL  4.  Patient will score < or = to 31/50 on NDI for decreased self perceived disability. Baseline: 36/50 Goal status: INITIAL     5.  Patient will be able to return to full work duties. Baseline:  Goal status: INITIAL   PLAN:  PT FREQUENCY: 1x/week  PT DURATION: 6 weeks  PLANNED INTERVENTIONS: 97164- PT Re-evaluation, 97110-Therapeutic exercises, 97530- Therapeutic activity, [id number]- Neuromuscular re-education, 97535- Self Care, 29562- Manual therapy, [id number]- Canalith repositioning, [id number]- Aquatic Therapy, 97014- Electrical stimulation (unattended), [id number]- Electrical stimulation (manual), [id number]- Vasopneumatic device, [id number]- Ultrasound, [id number]- Traction (mechanical), [id number]- Ionotophoresis 4mg /ml Dexamethasone , Patient/Family education, Balance training, Stair training, Taping, Dry Needling, Joint mobilization, Joint manipulation, Spinal manipulation, Spinal mobilization, Vestibular training, Cryotherapy, and Moist heat  PLAN FOR NEXT SESSION: assess response to treatment and repeat traction and dry needling if helpful.    Luella Sager, PT 03/15/23 9:30 AM  [facility] [address] [place], [place] [address] Phone # [phone] Fax [phone]

## 2023-03-15 NOTE — Patient Instructions (Signed)

## 2023-03-23 ENCOUNTER — Ambulatory Visit: Payer: Commercial Managed Care - HMO

## 2023-03-23 DIAGNOSIS — R293 Abnormal posture: Secondary | ICD-10-CM

## 2023-03-23 DIAGNOSIS — M79601 Pain in right arm: Secondary | ICD-10-CM

## 2023-03-23 DIAGNOSIS — M542 Cervicalgia: Secondary | ICD-10-CM | POA: Diagnosis not present

## 2023-03-23 DIAGNOSIS — R252 Cramp and spasm: Secondary | ICD-10-CM

## 2023-03-23 NOTE — Therapy (Signed)
OUTPATIENT PHYSICAL THERAPY TREATMENT   Patient Name: Jasmine Huerta MRN: [id number] DOB:1985-06-22, 38 y.o., female Today's Date: 03/23/2023  END OF SESSION:  PT End of Session - 03/23/23 1313     Visit Number 3    Date for PT Re-Evaluation 04/22/23    Authorization Type [employer]    PT Start Time 1231    PT Stop Time 1323    PT Time Calculation (min) 52 min    Activity Tolerance Patient tolerated treatment well    Behavior During Therapy WFL for tasks assessed/performed               Past Medical History:  Diagnosis Date   Anxiety    Arthritis    "My Dr said I do , I dont know."   Bipolar disorder (HCC) age 73   Depression    GDM (gestational diabetes mellitus)    history of GDM   GERD (gastroesophageal reflux disease)    Headache    migraines - 07/02/17-  none recently   Pre-diabetes    Past Surgical History:  Procedure Laterality Date   CHOLECYSTECTOMY N/A 07/05/2017   Procedure: LAPAROSCOPIC CHOLECYSTECTOMY;  Surgeon: Berna Bue, MD;  Location: [facility];  Service: General;  Laterality: N/A;   TUBAL LIGATION  09/17/2011   Procedure: POST PARTUM TUBAL LIGATION;  Surgeon: Purcell Nails, MD;  Location: [facility];  Service: Gynecology;  Laterality: Bilateral;   WISDOM TOOTH EXTRACTION     Patient Active Problem List   Diagnosis Date Noted   Bipolar disorder (HCC) 02/25/2023   Increased frequency of urination 02/25/2023   Cervical radiculitis 02/25/2023   Hyperlipidemia associated with type 2 diabetes mellitus (HCC) 12/03/2021   Vitamin D deficiency 12/03/2021   DM (diabetes mellitus), type 2 (HCC) 06/07/2018   NSVD (normal spontaneous vaginal delivery) 09/16/2011   Noncompliance with medications 08/25/2011   Noncompliance of patient with dietary regimen 08/25/2011   GDM (gestational diabetes mellitus) 08/25/2011   Gestational diabetes 07/16/2011   H/O gestational diabetes in prior pregnancy, currently pregnant 06/10/2011    PCP: Philip Aspen, Limmie Patricia,  MD  REFERRING PROVIDER: Philip Aspen, Limmie Patricia, MD  REFERRING DIAG: M54.2 (ICD-10-CM) - Neck pain R20.0,R20.2 (ICD-10-CM) - Numbness and tingling of right arm  THERAPY DIAG:  Cervicalgia  Pain in right arm  Cramp and spasm  Abnormal posture  Rationale for Evaluation and Treatment: Rehabilitation  ONSET DATE: February 17 2023  SUBJECTIVE:  SUBJECTIVE STATEMENT: The day after treatment it didn't hurt as bad in my Rt arm.  It is still vibrating.  My muscle pain is reduced.   From evaluation:  Patient presents with increased neck pain that radiates down her right arm that began January 2025. She was helping a patient out of bed and she felt the pain afterwards in her neck and down her Rt arm.  The pain comes in waves but pain is constant. It is a pins and needles sensation in her Rt arm and an achy pain in her neck area. She feels her right arm is weaker and she can barely write because of the pain. Neck pain mainly on the right side but she will occasionally feel pain on the Lt . She is not currently sleeping well she wakes up 4-5x a night. She sleeps with a heating pad to help with sleep. Toradol shots did help but the pain was still there. She is currently on light duty on work; lifting < 10lbs. When she went to her PCP 03/10/2023 she stretched her neck and that made her pain worst. April 2024 she had a workplace injury and injured her back and she feels that injury may have started her symptoms. Hand dominance: Right  PERTINENT HISTORY:  Anxiety; headaches; pre-diabetes; bipolar  PAIN: 03/23/23 Are you having pain? Yes: NPRS scale: 6/10 Pain location: Right side of neck; down full Rt arm, Lt neck Pain description: pins & needles; achy Aggravating factors: laying on her back; laying on  her right side; staying in same position for long periods of time Relieving factors: Nothing  PRECAUTIONS: None  RED FLAGS: None    WEIGHT BEARING RESTRICTIONS: No  FALLS:  Has patient fallen in last 6 months? 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  DIAGNOSTIC FINDINGS:  None  PATIENT SURVEYS:  NDI 36-50 72%  COGNITION: Overall cognitive status: Within functional limits for tasks assessed  SENSATION: Pins & needles down Rt arm  POSTURE: rounded shoulders and forward head  PALPATION: Increased muscle spasms & tenderness with bilateral upper trap palpation Increased muscle spasms of sub occipital palpation   CERVICAL ROM: *pain  Active ROM A/PROM (deg) eval  Flexion 40  Extension 45  Right lateral flexion 30*  Left lateral flexion 35*  Right rotation 55  Left rotation 55   (Blank rows = not tested)  UPPER EXTREMITY ROM: WFL no pain   UPPER EXTREMITY MMT:*pain  MMT Right eval Left eval  Shoulder flexion 4* 4+  Shoulder extension    Shoulder abduction 4-* 4+  Shoulder adduction    Shoulder extension    Shoulder internal rotation    Shoulder external rotation    Middle trapezius    Lower trapezius    Elbow flexion 4 4+  Elbow extension 4 4+  Wrist flexion    Wrist extension    Wrist ulnar deviation    Wrist radial deviation    Wrist pronation    Wrist supination    Grip strength 30lbs 50lbs   (Blank rows = not tested)  CERVICAL SPECIAL TESTS:  Upper limb tension test (ULTT): Positive and Distraction  test: Positive (+) Median & Ulnar Nerve ; (-) Radial N on Rt    FUNCTIONAL TESTS:  5 times sit to stand: 10.65sec no UE support  TREATMENT DATE:  03/23/23:  Arm Bike: Level 1x 6 min (3/3) Open book x10 each Supine: red band: D2 and horizontal abduction 2x10 bil each  Trigger Point Dry Needling Subsequent Treatment: Instructions provided previously at initial dry needling treatment.   Patient Verbal Consent Given: Yes Education Handout Provided: Previously Provided Muscles Treated: Bil upper traps and bil cervical multifidi  Electrical Stimulation Performed: No  Treatment Response/Outcome: twitch and improved tissue mobility  Elongation and release to treated tissues  Cervical traction: 15/5# 60/10 seconds x10 min  03/15/23:  review of HEP- max verbal and demo cues for median and ulnar nerve glides Arm Bike: Level 1x 6 min (3/3) Trigger Point Dry Needling  Initial Treatment: Pt instructed on Dry Needling rational, procedures, and possible side effects. Pt instructed to expect mild to moderate muscle soreness later in the day and/or into the next day.  Pt instructed in methods to reduce muscle soreness. Pt instructed to continue prescribed HEP. Because Dry Needling was performed over or adjacent to a lung field, pt was educated on S/S of pneumothorax and to seek immediate medical attention should they occur.  Patient was educated on signs and symptoms of infection and other risk factors and advised to seek medical attention should they occur.  Patient verbalized understanding of these instructions and education.   Patient Verbal Consent Given: Yes Education Handout Provided: Yes Muscles Treated: Bil upper traps and bil cervical multifidi  Electrical Stimulation Performed: No Treatment Response/Outcome: twitch and improved tissue mobility  Elongation and release to treated tissues  Cervical traction: 15/5# 60/10 seconds x10 min  03/11/2023 Initial Evaluation & HEP created                                                                                                                                PATIENT EDUCATION:  Education details: DN handout; HEP; POC Person educated: Patient Education method: Explanation, Demonstration, and Handouts Education comprehension: verbalized understanding, returned demonstration, and needs further education  HOME EXERCISE PROGRAM: Access Code: [email or web address] URL: [email or web address] Date: 03/11/2023 Prepared by: Claude Manges  Exercises - Median Nerve Flossing  - 1 x daily - 7 x weekly - 1 sets - 10 reps - Ulnar Nerve Flossing  - 1 x daily - 7 x weekly - 1 sets - 10 reps - Hooklying Neck Distraction and Traction  - 1 x daily - 7 x weekly - 1 sets - 5 reps - 10-15 hold  ASSESSMENT:  CLINICAL IMPRESSION: Pt reports that her Rt arm pain was better directly after last treatment although it has returned now.  Muscle pain is overall reduced since last session.  Pt did well with supine strength exercises and required cueing to reduce upper trap activation.  She demonstrated muscle fatigue with these exercises.  Good  response to dry needling with twitch response and improved tissue mobility and reduced pain after.  Good response to cervical traction as well. PT monitored throughout session for symptoms. Patient will benefit from skilled PT to address the below impairments and improve overall function.   OBJECTIVE IMPAIRMENTS: decreased activity tolerance, decreased ROM, decreased strength, increased fascial restrictions, impaired perceived functional ability, increased muscle spasms, impaired sensation, impaired UE functional use, postural dysfunction, and pain.   ACTIVITY LIMITATIONS: carrying, lifting, sleeping, reach over head, and caring for others  PARTICIPATION LIMITATIONS: cleaning, laundry, driving, community activity, and occupation  PERSONAL FACTORS: Time since onset of injury/illness/exacerbation and 3+ comorbidities: anxiety; bipolar ;pre-diabetes  are also affecting patient's  functional outcome.   REHAB POTENTIAL: Good  CLINICAL DECISION MAKING: Evolving/moderate complexity  EVALUATION COMPLEXITY: Moderate   GOALS: Goals reviewed with patient? Yes  SHORT TERM GOALS: Target date: 04/01/2023  Patient will be independent with initial HEP. Baseline:  Goal status: MET  2.  Patient will report > or = to 30% improvement in cervical radicular symptoms since starting PT. Baseline:  Goal status: INITIAL   LONG TERM GOALS: Target date: 04/22/2023  Patient will demonstrate independence in advanced HEP. Baseline:  Goal status: INITIAL  2.  Patient will report > or = to 50% improvement in cervical radicular symptoms since starting PT. Baseline:  Goal status: INITIAL  3.  Patient will verbalize and demonstrate self-care strategies to manage pain including tissue mobility practices and change of position. Baseline:  Goal status: INITIAL  4.  Patient will score < or = to 31/50 on NDI for decreased self perceived disability. Baseline: 36/50 Goal status: INITIAL     5.  Patient will be able to return to full work duties. Baseline:  Goal status: INITIAL   PLAN:  PT FREQUENCY: 1x/week  PT DURATION: 6 weeks  PLANNED INTERVENTIONS: 97164- PT Re-evaluation, 97110-Therapeutic exercises, 97530- Therapeutic activity, [id number]- Neuromuscular re-education, 97535- Self Care, 69629- Manual therapy, [id number]- Canalith repositioning, [id number]- Aquatic Therapy, 97014- Electrical stimulation (unattended), [id number]- Electrical stimulation (manual), [id number]- Vasopneumatic device, [id number]- Ultrasound, [id number]- Traction (mechanical), [id number]- Ionotophoresis 4mg /ml Dexamethasone, Patient/Family education, Balance training, Stair training, Taping, Dry Needling, Joint mobilization, Joint manipulation, Spinal manipulation, Spinal mobilization, Vestibular training, Cryotherapy, and Moist heat  PLAN FOR NEXT SESSION: assess response to treatment and repeat traction and dry needling if helpful. Add  supine strength to HEP   Lorrene Reid, PT 03/23/23 1:19 PM  [facility] [address] [place], [place] [address] Phone # [phone] Fax [phone]

## 2023-03-25 ENCOUNTER — Encounter: Payer: Self-pay | Admitting: Internal Medicine

## 2023-03-29 ENCOUNTER — Other Ambulatory Visit: Payer: Self-pay | Admitting: Internal Medicine

## 2023-03-29 ENCOUNTER — Other Ambulatory Visit (HOSPITAL_BASED_OUTPATIENT_CLINIC_OR_DEPARTMENT_OTHER): Payer: Self-pay

## 2023-03-29 DIAGNOSIS — G959 Disease of spinal cord, unspecified: Secondary | ICD-10-CM

## 2023-03-29 MED ORDER — METFORMIN HCL ER 500 MG PO TB24
500.0000 mg | ORAL_TABLET | Freq: Every evening | ORAL | 3 refills | Status: AC
  Filled 2023-03-29: qty 90, 90d supply, fill #0
  Filled 2023-05-01 – 2023-06-29 (×2): qty 90, 90d supply, fill #1
  Filled 2023-11-02: qty 90, 90d supply, fill #2
  Filled 2023-11-29 – 2024-01-16 (×4): qty 90, 90d supply, fill #3

## 2023-03-29 MED ORDER — TRULICITY 0.75 MG/0.5ML ~~LOC~~ SOAJ
0.7500 mg | SUBCUTANEOUS | 1 refills | Status: DC
Start: 2023-03-29 — End: 2023-08-20
  Filled 2023-03-29: qty 2, 28d supply, fill #0
  Filled 2023-04-28: qty 2, 28d supply, fill #1

## 2023-04-01 ENCOUNTER — Ambulatory Visit: Payer: Commercial Managed Care - HMO

## 2023-04-01 DIAGNOSIS — M542 Cervicalgia: Secondary | ICD-10-CM

## 2023-04-01 DIAGNOSIS — M79601 Pain in right arm: Secondary | ICD-10-CM

## 2023-04-01 DIAGNOSIS — M79602 Pain in left arm: Secondary | ICD-10-CM

## 2023-04-01 DIAGNOSIS — R252 Cramp and spasm: Secondary | ICD-10-CM

## 2023-04-01 DIAGNOSIS — R293 Abnormal posture: Secondary | ICD-10-CM

## 2023-04-01 NOTE — Therapy (Signed)
 OUTPATIENT PHYSICAL THERAPY TREATMENT   Patient Name: Jasmine Huerta MRN: [id number] DOB:[birth year], 38 y.o., female Today's Date: 04/01/2023  END OF SESSION:  PT End of Session - 04/01/23 1312     Visit Number 4    Date for PT Re-Evaluation 04/22/23    Authorization Type [employer]    PT Start Time 1233    PT Stop Time 1316    PT Time Calculation (min) 43 min    Activity Tolerance Patient tolerated treatment well    Behavior During Therapy WFL for tasks assessed/performed                Past Medical History:  Diagnosis Date   Anxiety    Arthritis    "My Dr said I do , I dont know."   Bipolar disorder (HCC) age 49   Depression    GDM (gestational diabetes mellitus)    history of GDM   GERD (gastroesophageal reflux disease)    Headache    migraines - 07/02/17-  none recently   Pre-diabetes    Past Surgical History:  Procedure Laterality Date   CHOLECYSTECTOMY N/A 07/05/2017   Procedure: LAPAROSCOPIC CHOLECYSTECTOMY;  Surgeon: Berna Bue, MD;  Location: [facility];  Service: General;  Laterality: N/A;   TUBAL LIGATION  09/17/2011   Procedure: POST PARTUM TUBAL LIGATION;  Surgeon: Purcell Nails, MD;  Location: [facility];  Service: Gynecology;  Laterality: Bilateral;   WISDOM TOOTH EXTRACTION     Patient Active Problem List   Diagnosis Date Noted   Bipolar disorder (HCC) 02/25/2023   Increased frequency of urination 02/25/2023   Cervical radiculitis 02/25/2023   Hyperlipidemia associated with type 2 diabetes mellitus (HCC) 12/03/2021   Vitamin D deficiency 12/03/2021   DM (diabetes mellitus), type 2 (HCC) 06/07/2018   NSVD (normal spontaneous vaginal delivery) 09/16/2011   Noncompliance with medications 08/25/2011   Noncompliance of patient with dietary regimen 08/25/2011   GDM (gestational diabetes mellitus) 08/25/2011   Gestational diabetes 07/16/2011   H/O gestational diabetes in prior pregnancy, currently pregnant 06/10/2011    PCP: Philip Aspen, Limmie Patricia,  MD  REFERRING PROVIDER: Philip Aspen, Limmie Patricia, MD  REFERRING DIAG: M54.2 (ICD-10-CM) - Neck pain R20.0,R20.2 (ICD-10-CM) - Numbness and tingling of right arm  THERAPY DIAG:  Cervicalgia  Pain in right arm  Cramp and spasm  Abnormal posture  Pain in left arm  Rationale for Evaluation and Treatment: Rehabilitation  ONSET DATE: February 17 2023  SUBJECTIVE:  SUBJECTIVE STATEMENT: I messaged the doctor and she is going to order an MRI.  Rt arm symptoms continue although my neck feels less stiff.  After each session, my arm pain is reduced and I can lie on my Rt side. Pain is less intense but still constant.    From evaluation:  Patient presents with increased neck pain that radiates down her right arm that began January 2025. She was helping a patient out of bed and she felt the pain afterwards in her neck and down her Rt arm.  The pain comes in waves but pain is constant. It is a pins and needles sensation in her Rt arm and an achy pain in her neck area. She feels her right arm is weaker and she can barely write because of the pain. Neck pain mainly on the right side but she will occasionally feel pain on the Lt . She is not currently sleeping well she wakes up 4-5x a night. She sleeps with a heating pad to help with sleep. Toradol shots did help but the pain was still there. She is currently on light duty on work; lifting < 10lbs. When she went to her PCP 03/10/2023 she stretched her neck and that made her pain worst. April 2024 she had a workplace injury and injured her back and she feels that injury may have started her symptoms. Hand dominance: Right  PERTINENT HISTORY:  Anxiety; headaches; pre-diabetes; bipolar  PAIN: 04/01/23 Are you having pain? Yes: NPRS scale: 4/10 Pain location:  Right side of neck; down full Rt arm, Lt neck Pain description: pins & needles; achy Aggravating factors: laying on her back; laying on her right side; staying in same position for long periods of time Relieving factors: Nothing  PRECAUTIONS: None  RED FLAGS: None    WEIGHT BEARING RESTRICTIONS: No  FALLS:  Has patient fallen in last 6 months? 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  DIAGNOSTIC FINDINGS:  None  PATIENT SURVEYS:  NDI 36-50 72%  COGNITION: Overall cognitive status: Within functional limits for tasks assessed  SENSATION: Pins & needles down Rt arm  POSTURE: rounded shoulders and forward head  PALPATION: Increased muscle spasms & tenderness with bilateral upper trap palpation Increased muscle spasms of sub occipital palpation   CERVICAL ROM: *pain  Active ROM A/PROM (deg) eval  Flexion 40  Extension 45  Right lateral flexion 30*  Left lateral flexion 35*  Right rotation 55  Left rotation 55   (Blank rows = not tested)  UPPER EXTREMITY ROM: WFL no pain   UPPER EXTREMITY MMT:*pain  MMT Right eval Left eval  Shoulder flexion 4* 4+  Shoulder extension    Shoulder abduction 4-* 4+  Shoulder adduction    Shoulder extension    Shoulder internal rotation    Shoulder external rotation    Middle trapezius    Lower trapezius    Elbow flexion 4 4+  Elbow extension 4 4+  Wrist flexion    Wrist extension    Wrist ulnar deviation    Wrist radial deviation    Wrist pronation    Wrist supination  Grip strength 30lbs 50lbs   (Blank rows = not tested)  CERVICAL SPECIAL  TESTS:  Upper limb tension test (ULTT): Positive and Distraction test: Positive (+) Median & Ulnar Nerve ; (-) Radial N on Rt   FUNCTIONAL TESTS:  5 times sit to stand: 10.65sec no UE support  TREATMENT DATE:   04/01/23:  Arm Bike: Level 1.5x 6 min (3/3) Self snags on Rt x10 Seated chin tucks- x 5 increased pain in Rt Lt  Seated upper trap stretch 3x20 seconds  Supine: red band: D2 and horizontal abduction, ER 2x10 bil each   Cervical traction: 16/5# 60/10 seconds x10 min  03/23/23:  Arm Bike: Level 1x 6 min (3/3) Open book x10 each Supine: red band: D2 and horizontal abduction 2x10 bil each  Trigger Point Dry Needling Subsequent Treatment: Instructions provided previously at initial dry needling treatment.   Patient Verbal Consent Given: Yes Education Handout Provided: Previously Provided Muscles Treated: Bil upper traps and bil cervical multifidi  Electrical Stimulation Performed: No  Treatment Response/Outcome: twitch and improved tissue mobility  Elongation and release to treated tissues  Cervical traction: 15/5# 60/10 seconds x10 min  03/15/23:  review of HEP- max verbal and demo cues for median and ulnar nerve glides Arm Bike: Level 1x 6 min (3/3) Trigger Point Dry Needling  Initial Treatment: Pt instructed on Dry Needling rational, procedures, and possible side effects. Pt instructed to expect mild to moderate muscle soreness later in the day and/or into the next day.  Pt instructed in methods to reduce muscle soreness. Pt instructed to continue prescribed HEP. Because Dry Needling was performed over or adjacent to a lung field, pt was educated on S/S of pneumothorax and to seek immediate medical attention should they occur.  Patient was educated on signs and symptoms of infection and other risk factors and advised to seek medical attention should they occur.  Patient verbalized understanding of these instructions and education.   Patient Verbal Consent Given:  Yes Education Handout Provided: Yes Muscles Treated: Bil upper traps and bil cervical multifidi  Electrical Stimulation Performed: No Treatment Response/Outcome: twitch and improved tissue mobility  Elongation and release to treated tissues  Cervical traction: 15/5# 60/10 seconds x10 min  03/11/2023 Initial Evaluation & HEP created                                                                                                                               PATIENT EDUCATION:  Education details: DN handout; HEP; POC Person educated: Patient Education method: Explanation, Demonstration, and Handouts Education comprehension: verbalized understanding, returned demonstration, and needs further education  HOME EXERCISE PROGRAM: Access Code: [email or web address] URL: [email or web address] Date: 04/01/2023 Prepared by: Tresa Endo  Exercises - Hooklying Neck Distraction and Traction  - 1 x daily - 7 x weekly - 1 sets - 5 reps - 10-15 hold - Supine Shoulder Horizontal Abduction with Resistance  - 2 x daily - 7 x weekly - 2 sets -  10 reps - Supine Bilateral Shoulder External Rotation with Resistance  - 2 x daily - 7 x weekly - 2 sets - 10 reps - Supine PNF D2 Flexion with Resistance  - 2 x daily - 7 x weekly - 2 sets - 10 reps - Seated Assisted Cervical Rotation with Towel  - 2 x daily - 7 x weekly - 1 sets - 5 reps - 5 hold - Seated Upper Trapezius Stretch  - 3 x daily - 7 x weekly - 1 sets - 3 reps - 20 hold  ASSESSMENT:  CLINICAL IMPRESSION: Pt reports that her Rt UE pain is reduced in frequency although still constant.  Pain has reduced from 9/10 to 4/10.  Pt reports that nerve glides aggravate symptoms so PT removed this from HEP.  Trial of chin tucks in sitting and this aggravated symptoms as well.   Good response to cervical traction today and prior sessions with reduced Rt UE radiculopathy.  PT monitored throughout session for symptoms. Patient will benefit from skilled PT to address the  below impairments and improve overall function.   OBJECTIVE IMPAIRMENTS: decreased activity tolerance, decreased ROM, decreased strength, increased fascial restrictions, impaired perceived functional ability, increased muscle spasms, impaired sensation, impaired UE functional use, postural dysfunction, and pain.   ACTIVITY LIMITATIONS: carrying, lifting, sleeping, reach over head, and caring for others  PARTICIPATION LIMITATIONS: cleaning, laundry, driving, community activity, and occupation  PERSONAL FACTORS: Time since onset of injury/illness/exacerbation and 3+ comorbidities: anxiety; bipolar ;pre-diabetes  are also affecting patient's functional outcome.   REHAB POTENTIAL: Good  CLINICAL DECISION MAKING: Evolving/moderate complexity  EVALUATION COMPLEXITY: Moderate   GOALS: Goals reviewed with patient? Yes  SHORT TERM GOALS: Target date: 04/01/2023  Patient will be independent with initial HEP. Baseline:  Goal status: MET  2.  Patient will report > or = to 30% improvement in cervical radicular symptoms since starting PT. Baseline: less intensity from 9/10 to 4/10, still same frequency  Goal status: MET   LONG TERM GOALS: Target date: 04/22/2023  Patient will demonstrate independence in advanced HEP. Baseline:  Goal status: INITIAL  2.  Patient will report > or = to 50% improvement in cervical radicular symptoms since starting PT. Baseline:  Goal status: INITIAL  3.  Patient will verbalize and demonstrate self-care strategies to manage pain including tissue mobility practices and change of position. Baseline:  Goal status: INITIAL  4.  Patient will score < or = to 31/50 on NDI for decreased self perceived disability. Baseline: 36/50 Goal status: INITIAL     5.  Patient will be able to return to full work duties. Baseline: light duty (04/01/23) Goal status: INITIAL   PLAN:  PT FREQUENCY: 1x/week  PT DURATION: 6 weeks  PLANNED INTERVENTIONS: 97164- PT  Re-evaluation, 97110-Therapeutic exercises, 97530- Therapeutic activity, 97112- Neuromuscular re-education, 97535- Self Care, 16109- Manual therapy, [id number]- Canalith repositioning, [id number]- Aquatic Therapy, 97014- Electrical stimulation (unattended), [id number]- Electrical stimulation (manual), [id number]- Vasopneumatic device, [id number]- Ultrasound, [id number]- Traction (mechanical), [id number]- Ionotophoresis 4mg /ml Dexamethasone, Patient/Family education, Balance training, Stair training, Taping, Dry Needling, Joint mobilization, Joint manipulation, Spinal manipulation, Spinal mobilization, Vestibular training, Cryotherapy, and Moist heat  PLAN FOR NEXT SESSION: assess response to treatment and repeat traction and dry needling if helpful.    Lorrene Reid, PT 04/01/23 1:14 PM  [facility] [address], [address] [place], [place] [address] Phone # [phone] Fax [phone]

## 2023-04-06 ENCOUNTER — Ambulatory Visit: Payer: Commercial Managed Care - HMO | Attending: Internal Medicine

## 2023-04-06 DIAGNOSIS — R293 Abnormal posture: Secondary | ICD-10-CM | POA: Diagnosis present

## 2023-04-06 DIAGNOSIS — R252 Cramp and spasm: Secondary | ICD-10-CM | POA: Insufficient documentation

## 2023-04-06 DIAGNOSIS — M79601 Pain in right arm: Secondary | ICD-10-CM | POA: Insufficient documentation

## 2023-04-06 DIAGNOSIS — M542 Cervicalgia: Secondary | ICD-10-CM | POA: Insufficient documentation

## 2023-04-06 DIAGNOSIS — M79602 Pain in left arm: Secondary | ICD-10-CM | POA: Insufficient documentation

## 2023-04-06 NOTE — Therapy (Signed)
 OUTPATIENT PHYSICAL THERAPY TREATMENT   Patient Name: Jasmine Huerta MRN: [id number] DOB:[birth year], 38 y.o., female Today's Date: 04/06/2023  END OF SESSION:  PT End of Session - 04/06/23 1216     Visit Number 5    Date for PT Re-Evaluation 04/22/23    Authorization Type [employer]    PT Start Time 1149    PT Stop Time 1229    PT Time Calculation (min) 40 min    Activity Tolerance Patient tolerated treatment well    Behavior During Therapy WFL for tasks assessed/performed                 Past Medical History:  Diagnosis Date   Anxiety    Arthritis    "My Dr said I do , I dont know."   Bipolar disorder (HCC) age 46   Depression    GDM (gestational diabetes mellitus)    history of GDM   GERD (gastroesophageal reflux disease)    Headache    migraines - 07/02/17-  none recently   Pre-diabetes    Past Surgical History:  Procedure Laterality Date   CHOLECYSTECTOMY N/A 07/05/2017   Procedure: LAPAROSCOPIC CHOLECYSTECTOMY;  Surgeon: Berna Bue, MD;  Location: [facility];  Service: General;  Laterality: N/A;   TUBAL LIGATION  09/17/2011   Procedure: POST PARTUM TUBAL LIGATION;  Surgeon: Purcell Nails, MD;  Location: [facility];  Service: Gynecology;  Laterality: Bilateral;   WISDOM TOOTH EXTRACTION     Patient Active Problem List   Diagnosis Date Noted   Bipolar disorder (HCC) 02/25/2023   Increased frequency of urination 02/25/2023   Cervical radiculitis 02/25/2023   Hyperlipidemia associated with type 2 diabetes mellitus (HCC) 12/03/2021   Vitamin D deficiency 12/03/2021   DM (diabetes mellitus), type 2 (HCC) 06/07/2018   NSVD (normal spontaneous vaginal delivery) 09/16/2011   Noncompliance with medications 08/25/2011   Noncompliance of patient with dietary regimen 08/25/2011   GDM (gestational diabetes mellitus) 08/25/2011   Gestational diabetes 07/16/2011   H/O gestational diabetes in prior pregnancy, currently pregnant 06/10/2011    PCP: Philip Aspen, Limmie Patricia,  MD  REFERRING PROVIDER: Philip Aspen, Limmie Patricia, MD  REFERRING DIAG: M54.2 (ICD-10-CM) - Neck pain R20.0,R20.2 (ICD-10-CM) - Numbness and tingling of right arm  THERAPY DIAG:  Cervicalgia  Pain in right arm  Cramp and spasm  Abnormal posture  Rationale for Evaluation and Treatment: Rehabilitation  ONSET DATE: February 17 2023  SUBJECTIVE:  SUBJECTIVE STATEMENT: I had less tingling in my Rt arm x 2 days after last session.  Tingling has returned today but less intense.   From evaluation:  Patient presents with increased neck pain that radiates down her right arm that began January 2025. She was helping a patient out of bed and she felt the pain afterwards in her neck and down her Rt arm.  The pain comes in waves but pain is constant. It is a pins and needles sensation in her Rt arm and an achy pain in her neck area. She feels her right arm is weaker and she can barely write because of the pain. Neck pain mainly on the right side but she will occasionally feel pain on the Lt . She is not currently sleeping well she wakes up 4-5x a night. She sleeps with a heating pad to help with sleep. Toradol shots did help but the pain was still there. She is currently on light duty on work; lifting < 10lbs. When she went to her PCP 03/10/2023 she stretched her neck and that made her pain worst. April 2024 she had a workplace injury and injured her back and she feels that injury may have started her symptoms. Hand dominance: Right  PERTINENT HISTORY:  Anxiety; headaches; pre-diabetes; bipolar  PAIN: 04/05/23 Are you having pain? Yes: NPRS scale: 4/10 Pain location: Right side of neck; down full Rt arm, Lt neck Pain description: pins & needles; achy Aggravating factors: laying on her back; laying on her  right side; staying in same position for long periods of time Relieving factors: Nothing  PRECAUTIONS: None  RED FLAGS: None    WEIGHT BEARING RESTRICTIONS: No  FALLS:  Has patient fallen in last 6 months? 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  DIAGNOSTIC FINDINGS:  None  PATIENT SURVEYS:  NDI 36-50 72%  COGNITION: Overall cognitive status: Within functional limits for tasks assessed  SENSATION: Pins & needles down Rt arm  POSTURE: rounded shoulders and forward head  PALPATION: Increased muscle spasms & tenderness with bilateral upper trap palpation Increased muscle spasms of sub occipital palpation   CERVICAL ROM: *pain  Active ROM A/PROM (deg) eval  Flexion 40  Extension 45  Right lateral flexion 30*  Left lateral flexion 35*  Right rotation 55  Left rotation 55   (Blank rows = not tested)  UPPER EXTREMITY ROM: WFL no pain   UPPER EXTREMITY MMT:*pain  MMT Right eval Left eval  Shoulder flexion 4* 4+  Shoulder extension    Shoulder abduction 4-* 4+  Shoulder adduction    Shoulder extension    Shoulder internal rotation    Shoulder external rotation    Middle trapezius    Lower trapezius    Elbow flexion 4 4+  Elbow extension 4 4+  Wrist flexion    Wrist extension    Wrist ulnar deviation    Wrist radial deviation    Wrist pronation    Wrist supination    Grip strength 30lbs 50lbs   (Blank rows = not tested)  CERVICAL SPECIAL TESTS:  Upper limb tension test (ULTT): Positive and Distraction test: Positive (+)  Median & Ulnar Nerve ; (-) Radial N on Rt   FUNCTIONAL  TESTS:  5 times sit to stand: 10.65sec no UE support  TREATMENT DATE:    04/05/23:  Arm Bike: Level 1.5x 6 min (3/3)-PT present to discuss progress  Wall clocks: blue loop x5 each  Self snags on Rt x10 Seated chin tucks- x 5 increased pain in Rt UE Seated upper trap stretch 3x20 seconds  Supine: red band: D2 and horizontal abduction, ER 2x10 bil each  Cervical traction: 16/5# 60/10 seconds x10 min  04/01/23:  Arm Bike: Level 1.5x 6 min (3/3) Self snags on Rt x10 Seated chin tucks- x 5 increased pain in Rt Lt  Seated upper trap stretch 3x20 seconds  Supine: red band: D2 and horizontal abduction, ER 2x10 bil each   Cervical traction: 16/5# 60/10 seconds x10 min  03/23/23:  Arm Bike: Level 1x 6 min (3/3) Open book x10 each Supine: red band: D2 and horizontal abduction 2x10 bil each  Trigger Point Dry Needling Subsequent Treatment: Instructions provided previously at initial dry needling treatment.   Patient Verbal Consent Given: Yes Education Handout Provided: Previously Provided Muscles Treated: Bil upper traps and bil cervical multifidi  Electrical Stimulation Performed: No  Treatment Response/Outcome: twitch and improved tissue mobility  Elongation and release to treated tissues  Cervical traction: 15/5# 60/10 seconds x10 min  03/15/23:  review of HEP- max verbal and demo cues for median and ulnar nerve glides Arm Bike: Level 1x 6 min (3/3) Trigger Point Dry Needling  Initial Treatment: Pt instructed on Dry Needling rational, procedures, and possible side effects. Pt instructed to expect mild to moderate muscle soreness later in the day and/or into the next day.  Pt instructed in methods to reduce muscle soreness. Pt instructed to continue prescribed HEP. Because Dry Needling was performed over or adjacent to a lung field, pt was educated on S/S of pneumothorax and to seek immediate medical attention should they occur.  Patient was educated on signs and symptoms of  infection and other risk factors and advised to seek medical attention should they occur.  Patient verbalized understanding of these instructions and education.   Patient Verbal Consent Given: Yes Education Handout Provided: Yes Muscles Treated: Bil upper traps and bil cervical multifidi  Electrical Stimulation Performed: No Treatment Response/Outcome: twitch and improved tissue mobility  Elongation and release to treated tissues  Cervical traction: 15/5# 60/10 seconds x10 min   PATIENT EDUCATION:  Education details: DN handout; HEP; POC Person educated: Patient Education method: Explanation, Demonstration, and Handouts Education comprehension: verbalized understanding, returned demonstration, and needs further education  HOME EXERCISE PROGRAM: Access Code: [email or web address] URL: [email or web address] Date: 04/01/2023 Prepared by: Tresa Endo  Exercises - Hooklying Neck Distraction and Traction  - 1 x daily - 7 x weekly - 1 sets - 5 reps - 10-15 hold - Supine Shoulder Horizontal Abduction with Resistance  - 2 x daily - 7 x weekly - 2 sets - 10 reps - Supine Bilateral Shoulder External Rotation with Resistance  - 2 x daily - 7 x weekly - 2 sets - 10 reps - Supine PNF D2 Flexion with Resistance  - 2 x daily - 7 x weekly - 2 sets - 10 reps - Seated Assisted Cervical Rotation with Towel  - 2 x daily - 7 x weekly - 1 sets - 5 reps - 5 hold - Seated Upper Trapezius Stretch  - 3 x daily - 7 x weekly - 1 sets - 3 reps -  20 hold  ASSESSMENT:  CLINICAL IMPRESSION: Pt had reduced Lt UE tingling after last session x 2 days. Pain is less intense overall.  Pt is not able to do nerve glides or cervical retraction due to increased symptoms with this. She required tactile and verbal cues for technique with exercise. Pt performed supine band exercises without increased Rt UE pain or radiculopathy today.   Good response to cervical traction today and prior sessions with reduced Rt UE radiculopathy  and she will monitor these symptoms to determine how long relief lasts.   PT monitored throughout session for symptoms. Patient will benefit from skilled PT to address the below impairments and improve overall function.   OBJECTIVE IMPAIRMENTS: decreased activity tolerance, decreased ROM, decreased strength, increased fascial restrictions, impaired perceived functional ability, increased muscle spasms, impaired sensation, impaired UE functional use, postural dysfunction, and pain.   ACTIVITY LIMITATIONS: carrying, lifting, sleeping, reach over head, and caring for others  PARTICIPATION LIMITATIONS: cleaning, laundry, driving, community activity, and occupation  PERSONAL FACTORS: Time since onset of injury/illness/exacerbation and 3+ comorbidities: anxiety; bipolar ;pre-diabetes  are also affecting patient's functional outcome.   REHAB POTENTIAL: Good  CLINICAL DECISION MAKING: Evolving/moderate complexity  EVALUATION COMPLEXITY: Moderate   GOALS: Goals reviewed with patient? Yes  SHORT TERM GOALS: Target date: 04/01/2023  Patient will be independent with initial HEP. Baseline:  Goal status: MET  2.  Patient will report > or = to 30% improvement in cervical radicular symptoms since starting PT. Baseline: less intensity from 9/10 to 4/10, still same frequency  Goal status: MET   LONG TERM GOALS: Target date: 04/22/2023  Patient will demonstrate independence in advanced HEP. Baseline:  Goal status: INITIAL  2.  Patient will report > or = to 50% improvement in cervical radicular symptoms since starting PT. Baseline:  Goal status: INITIAL  3.  Patient will verbalize and demonstrate self-care strategies to manage pain including tissue mobility practices and change of position. Baseline:  Goal status: INITIAL  4.  Patient will score < or = to 31/50 on NDI for decreased self perceived disability. Baseline: 36/50 Goal status: INITIAL     5.  Patient will be able to return to  full work duties. Baseline: light duty (04/01/23) Goal status: INITIAL   PLAN:  PT FREQUENCY: 1x/week  PT DURATION: 6 weeks  PLANNED INTERVENTIONS: 97164- PT Re-evaluation, 97110-Therapeutic exercises, 97530- Therapeutic activity, 97112- Neuromuscular re-education, 97535- Self Care, 16109- Manual therapy, [id number]- Canalith repositioning, [id number]- Aquatic Therapy, 97014- Electrical stimulation (unattended), [id number]- Electrical stimulation (manual), [id number]- Vasopneumatic device, [id number]- Ultrasound, [id number]- Traction (mechanical), [id number]- Ionotophoresis 4mg /ml Dexamethasone, Patient/Family education, Balance training, Stair training, Taping, Dry Needling, Joint mobilization, Joint manipulation, Spinal manipulation, Spinal mobilization, Vestibular training, Cryotherapy, and Moist heat  PLAN FOR NEXT SESSION: assess response to treatment and repeat traction and dry needling if helpful.    Lorrene Reid, PT 04/06/23 12:17 PM  [facility] [address], [address] [place], [place] [address] Phone # [phone] Fax [phone]

## 2023-04-10 ENCOUNTER — Encounter (HOSPITAL_BASED_OUTPATIENT_CLINIC_OR_DEPARTMENT_OTHER): Payer: Self-pay

## 2023-04-12 ENCOUNTER — Other Ambulatory Visit (HOSPITAL_BASED_OUTPATIENT_CLINIC_OR_DEPARTMENT_OTHER): Payer: Self-pay

## 2023-04-13 ENCOUNTER — Other Ambulatory Visit (HOSPITAL_BASED_OUTPATIENT_CLINIC_OR_DEPARTMENT_OTHER): Payer: Self-pay

## 2023-04-14 ENCOUNTER — Other Ambulatory Visit (HOSPITAL_BASED_OUTPATIENT_CLINIC_OR_DEPARTMENT_OTHER): Payer: Self-pay

## 2023-04-14 MED ORDER — ESCITALOPRAM OXALATE 10 MG PO TABS
10.0000 mg | ORAL_TABLET | Freq: Every day | ORAL | 3 refills | Status: DC
Start: 2023-04-14 — End: 2023-08-20
  Filled 2023-04-14: qty 30, 30d supply, fill #0
  Filled 2023-05-01: qty 30, 30d supply, fill #1

## 2023-04-14 MED ORDER — AMPHETAMINE-DEXTROAMPHET ER 30 MG PO CP24
30.0000 mg | ORAL_CAPSULE | Freq: Every morning | ORAL | 0 refills | Status: DC
Start: 2023-04-14 — End: 2023-07-06
  Filled 2023-04-14: qty 30, 30d supply, fill #0

## 2023-04-15 ENCOUNTER — Ambulatory Visit: Payer: Commercial Managed Care - HMO

## 2023-04-15 DIAGNOSIS — M542 Cervicalgia: Secondary | ICD-10-CM

## 2023-04-15 DIAGNOSIS — R252 Cramp and spasm: Secondary | ICD-10-CM

## 2023-04-15 DIAGNOSIS — R293 Abnormal posture: Secondary | ICD-10-CM

## 2023-04-15 DIAGNOSIS — M79601 Pain in right arm: Secondary | ICD-10-CM

## 2023-04-15 NOTE — Therapy (Signed)
 OUTPATIENT PHYSICAL THERAPY TREATMENT   Patient Name: Jasmine Huerta MRN: [id number] DOB:10/07/1985, 38 y.o., female Today's Date: 04/15/2023  END OF SESSION:  PT End of Session - 04/15/23 1302     Visit Number 6    Date for PT Re-Evaluation 04/22/23    Authorization Type [employer]    PT Start Time 1232    PT Stop Time 1312    PT Time Calculation (min) 40 min    Activity Tolerance Patient tolerated treatment well    Behavior During Therapy WFL for tasks assessed/performed                  Past Medical History:  Diagnosis Date   Anxiety    Arthritis    "My Dr said I do , I dont know."   Bipolar disorder (HCC) age 32   Depression    GDM (gestational diabetes mellitus)    history of GDM   GERD (gastroesophageal reflux disease)    Headache    migraines - 07/02/17-  none recently   Pre-diabetes    Past Surgical History:  Procedure Laterality Date   CHOLECYSTECTOMY N/A 07/05/2017   Procedure: LAPAROSCOPIC CHOLECYSTECTOMY;  Surgeon: Berna Bue, MD;  Location: [facility];  Service: General;  Laterality: N/A;   TUBAL LIGATION  09/17/2011   Procedure: POST PARTUM TUBAL LIGATION;  Surgeon: Purcell Nails, MD;  Location: [facility];  Service: Gynecology;  Laterality: Bilateral;   WISDOM TOOTH EXTRACTION     Patient Active Problem List   Diagnosis Date Noted   Bipolar disorder (HCC) 02/25/2023   Increased frequency of urination 02/25/2023   Cervical radiculitis 02/25/2023   Hyperlipidemia associated with type 2 diabetes mellitus (HCC) 12/03/2021   Vitamin D deficiency 12/03/2021   DM (diabetes mellitus), type 2 (HCC) 06/07/2018   NSVD (normal spontaneous vaginal delivery) 09/16/2011   Noncompliance with medications 08/25/2011   Noncompliance of patient with dietary regimen 08/25/2011   GDM (gestational diabetes mellitus) 08/25/2011   Gestational diabetes 07/16/2011   H/O gestational diabetes in prior pregnancy, currently pregnant 06/10/2011    PCP: Philip Aspen, Limmie Patricia, MD  REFERRING PROVIDER: Philip Aspen, Limmie Patricia, MD  REFERRING DIAG: M54.2 (ICD-10-CM) - Neck pain R20.0,R20.2 (ICD-10-CM) - Numbness and tingling of right arm  THERAPY DIAG:  Cervicalgia  Pain in right arm  Cramp and spasm  Abnormal posture  Rationale for Evaluation and Treatment: Rehabilitation  ONSET DATE: February 17 2023  SUBJECTIVE:  SUBJECTIVE STATEMENT: The tingling is less prominent in my Lt arm.  Traction improves symptoms for 1-2 days.  Able to sleep now.  I am going to schedule the MRI  From evaluation:  Patient presents with increased neck pain that radiates down her right arm that began January 2025. She was helping a patient out of bed and she felt the pain afterwards in her neck and down her Rt arm.  The pain comes in waves but pain is constant. It is a pins and needles sensation in her Rt arm and an achy pain in her neck area. She feels her right arm is weaker and she can barely write because of the pain. Neck pain mainly on the right side but she will occasionally feel pain on the Lt . She is not currently sleeping well she wakes up 4-5x a night. She sleeps with a heating pad to help with sleep. Toradol shots did help but the pain was still there. She is currently on light duty on work; lifting < 10lbs. When she went to her PCP 03/10/2023 she stretched her neck and that made her pain worst. April 2024 she had a workplace injury and injured her back and she feels that injury may have started her symptoms. Hand dominance: Right  PERTINENT HISTORY:  Anxiety; headaches; pre-diabetes; bipolar  PAIN: 04/15/23 Are you having pain? Yes: NPRS scale: 5/10 Pain location: Right side of neck; down full Rt arm, Lt neck Pain description: pins & needles; achy Aggravating factors:  laying on her back; laying on her right side; staying in same position for long periods of time Relieving factors: Nothing  PRECAUTIONS: None  RED FLAGS: None    WEIGHT BEARING RESTRICTIONS: No  FALLS:  Has patient fallen in last 6 months? 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  DIAGNOSTIC FINDINGS:  None  PATIENT SURVEYS:  NDI 36-50 72% 04/15/23: 42%  COGNITION: Overall cognitive status: Within functional limits for tasks assessed  SENSATION: Pins & needles down Rt arm  POSTURE: rounded shoulders and forward head  PALPATION: Increased muscle spasms & tenderness with bilateral upper trap palpation Increased muscle spasms of sub occipital palpation   CERVICAL ROM: *pain  Active ROM A/PROM (deg) eval A/ROM 04/15/23  Flexion 40   Extension 45   Right lateral flexion 30* 40  Left lateral flexion 35* 40  Right rotation 55 55  Left rotation 55 55   (Blank rows = not tested)  UPPER EXTREMITY ROM: WFL no pain   UPPER EXTREMITY MMT:*pain  MMT Right eval Left eval  Shoulder flexion 4* 4+  Shoulder extension    Shoulder abduction 4-* 4+  Shoulder adduction    Shoulder extension    Shoulder internal rotation    Shoulder external rotation    Middle trapezius    Lower trapezius    Elbow flexion 4 4+  Elbow extension 4 4+  Wrist flexion    Wrist extension    Wrist ulnar deviation    Wrist radial deviation    Wrist pronation    Wrist supination    Grip strength 30lbs 50lbs   (Blank rows = not  tested)  CERVICAL SPECIAL TESTS:  Upper limb tension test (ULTT): Positive and Distraction  test: Positive (+) Median & Ulnar Nerve ; (-) Radial N on Rt   FUNCTIONAL TESTS:  5 times sit to stand: 10.65sec no UE support  TREATMENT DATE:   04/15/23:  Arm Bike: Level 1.5x 6 min (3/3)-PT present to discuss progress  Cervical A/ROM: 3 ways 2x20 seconds- measurements taken after  Wall clocks: blue loop x5 each  3 way raises: 1# 2x10 bil each   Cervical traction: 16/5# 60/10 seconds x10 min 04/05/23:  Arm Bike: Level 1.5x 6 min (3/3)-PT present to discuss progress  Wall clocks: blue loop x5 each  Self snags on Rt x10 Seated chin tucks- x 5 increased pain in Rt UE Seated upper trap stretch 3x20 seconds  Supine: red band: D2 and horizontal abduction, ER 2x10 bil each  Cervical traction: 16/5# 60/10 seconds x10 min  04/01/23:  Arm Bike: Level 1.5x 6 min (3/3) Self snags on Rt x10 Seated chin tucks- x 5 increased pain in Rt Lt  Seated upper trap stretch 3x20 seconds  Supine: red band: D2 and horizontal abduction, ER 2x10 bil each   Cervical traction: 16/5# 60/10 seconds x10 min   PATIENT EDUCATION:  Education details: DN handout; HEP; POC Person educated: Patient Education method: Explanation, Demonstration, and Handouts Education comprehension: verbalized understanding, returned demonstration, and needs further education  HOME EXERCISE PROGRAM: Access Code: [email or web address] URL: [email or web address] Date: 04/01/2023 Prepared by: Tresa Endo  Exercises - Hooklying Neck Distraction and Traction  - 1 x daily - 7 x weekly - 1 sets - 5 reps - 10-15 hold - Supine Shoulder Horizontal Abduction with Resistance  - 2 x daily - 7 x weekly - 2 sets - 10 reps - Supine Bilateral Shoulder External Rotation with Resistance  - 2 x daily - 7 x weekly - 2 sets - 10 reps - Supine PNF D2 Flexion with Resistance  - 2 x daily - 7 x weekly - 2 sets - 10 reps - Seated Assisted Cervical Rotation with Towel  - 2 x daily - 7 x weekly - 1 sets - 5 reps - 5 hold - Seated Upper Trapezius Stretch   - 3 x daily - 7 x weekly - 1 sets - 3 reps - 20 hold  ASSESSMENT:  CLINICAL IMPRESSION: Pt reports 30-35% reduction in her Rt UE symptoms since the start of care.  Pt reports that the nerve pain is less prominent.  Reproduction of Rt UE radiculopathy with Rt side bending and forward flexion stretch.  She is able to sleep better with fewer interruptions.   Neck Disability Index improved to 21/50, goal met. Good response to cervical traction today and prior sessions with reduced Rt UE radiculopathy and she will monitor these symptoms to determine how long relief lasts.   PT monitored throughout session for symptoms. Patient will benefit from skilled PT to address the below impairments and improve overall function.   OBJECTIVE IMPAIRMENTS: decreased activity tolerance, decreased ROM, decreased strength, increased fascial restrictions, impaired perceived functional ability, increased muscle spasms, impaired sensation, impaired UE functional use, postural dysfunction, and pain.   ACTIVITY LIMITATIONS: carrying, lifting, sleeping, reach over head, and caring for others  PARTICIPATION LIMITATIONS: cleaning, laundry, driving, community activity, and occupation  PERSONAL FACTORS: Time since onset of injury/illness/exacerbation and 3+ comorbidities: anxiety; bipolar ;pre-diabetes  are also affecting patient's functional outcome.   REHAB POTENTIAL: Good  CLINICAL DECISION MAKING: Evolving/moderate complexity  EVALUATION COMPLEXITY: Moderate  GOALS: Goals reviewed with patient? Yes  SHORT TERM GOALS: Target date: 04/01/2023  Patient will be independent with initial HEP. Baseline:  Goal status: MET  2.  Patient will report > or = to 30% improvement in cervical radicular symptoms since starting PT. Baseline: 30-35% (04/15/23) Goal status: MET   LONG TERM GOALS: Target date: 04/22/2023  Patient will demonstrate independence in advanced HEP. Baseline:  Goal status: INITIAL  2.  Patient will  report > or = to 50% improvement in cervical radicular symptoms since starting PT. Baseline: 30-35% (04/15/23) Goal status: INITIAL  3.  Patient will verbalize and demonstrate self-care strategies to manage pain including tissue mobility practices and change of position. Baseline:  Goal status: INITIAL  4.  Patient will score < or = to 31/50 on NDI for decreased self perceived disability. Baseline: 21/50 (42%) 04/15/23 Goal status: MET     5.  Patient will be able to return to full work duties. Baseline: light duty (04/01/23) Goal status: INITIAL   PLAN:  PT FREQUENCY: 1x/week  PT DURATION: 6 weeks  PLANNED INTERVENTIONS: 97164- PT Re-evaluation, 97110-Therapeutic exercises, 97530- Therapeutic activity, 97112- Neuromuscular re-education, 97535- Self Care, 40981- Manual therapy, [id number]- Canalith repositioning, [id number]- Aquatic Therapy, 97014- Electrical stimulation (unattended), [id number]- Electrical stimulation (manual), [id number]- Vasopneumatic device, [id number]- Ultrasound, [id number]- Traction (mechanical), [id number]- Ionotophoresis 4mg /ml Dexamethasone, Patient/Family education, Balance training, Stair training, Taping, Dry Needling, Joint mobilization, Joint manipulation, Spinal manipulation, Spinal mobilization, Vestibular training, Cryotherapy, and Moist heat  PLAN FOR NEXT SESSION: assess response to treatment and repeat traction and dry needling if helpful.    Lorrene Reid, PT 04/15/23 1:06 PM  [facility] [address], [address] [place], [place] [address] Phone # [phone] Fax [phone]

## 2023-04-16 ENCOUNTER — Other Ambulatory Visit (HOSPITAL_BASED_OUTPATIENT_CLINIC_OR_DEPARTMENT_OTHER): Payer: Self-pay

## 2023-04-19 ENCOUNTER — Encounter: Payer: Self-pay | Admitting: Internal Medicine

## 2023-04-19 ENCOUNTER — Ambulatory Visit: Payer: Commercial Managed Care - HMO

## 2023-04-19 DIAGNOSIS — R293 Abnormal posture: Secondary | ICD-10-CM

## 2023-04-19 DIAGNOSIS — M542 Cervicalgia: Secondary | ICD-10-CM | POA: Diagnosis not present

## 2023-04-19 DIAGNOSIS — R252 Cramp and spasm: Secondary | ICD-10-CM

## 2023-04-19 DIAGNOSIS — M79602 Pain in left arm: Secondary | ICD-10-CM

## 2023-04-19 DIAGNOSIS — M79601 Pain in right arm: Secondary | ICD-10-CM

## 2023-04-19 NOTE — Therapy (Addendum)
 OUTPATIENT PHYSICAL THERAPY TREATMENT   Patient Name: Jasmine Huerta MRN: [id number] DOB:1985-11-04, 38 y.o., female Today's Date: 04/19/2023  END OF SESSION:  PT End of Session - 04/19/23 1250     Visit Number 7    Date for PT Re-Evaluation 04/22/23    Authorization Type [employer]    PT Start Time 1232    PT Stop Time 1305    PT Time Calculation (min) 33 min    Activity Tolerance Patient tolerated treatment well    Behavior During Therapy WFL for tasks assessed/performed                   Past Medical History:  Diagnosis Date   Anxiety    Arthritis    "My Dr said I do , I dont know."   Bipolar disorder (HCC) age 40   Depression    GDM (gestational diabetes mellitus)    history of GDM   GERD (gastroesophageal reflux disease)    Headache    migraines - 07/02/17-  none recently   Pre-diabetes    Past Surgical History:  Procedure Laterality Date   CHOLECYSTECTOMY N/A 07/05/2017   Procedure: LAPAROSCOPIC CHOLECYSTECTOMY;  Surgeon: Adalberto Acton, MD;  Location: [facility];  Service: General;  Laterality: N/A;   TUBAL LIGATION  09/17/2011   Procedure: POST PARTUM TUBAL LIGATION;  Surgeon: Madelene Schanz, MD;  Location: [facility];  Service: Gynecology;  Laterality: Bilateral;   WISDOM TOOTH EXTRACTION     Patient Active Problem List   Diagnosis Date Noted   Bipolar disorder (HCC) 02/25/2023   Increased frequency of urination 02/25/2023   Cervical radiculitis 02/25/2023   Hyperlipidemia associated with type 2 diabetes mellitus (HCC) 12/03/2021   Vitamin D  deficiency 12/03/2021   DM (diabetes mellitus), type 2 (HCC) 06/07/2018   NSVD (normal spontaneous vaginal delivery) 09/16/2011   Noncompliance with medications 08/25/2011   Noncompliance of patient with dietary regimen 08/25/2011   GDM (gestational diabetes mellitus) 08/25/2011   Gestational diabetes 07/16/2011   H/O gestational diabetes in prior pregnancy, currently pregnant 06/10/2011    PCP: Zilphia Hilt,  Charyl Coppersmith, MD  REFERRING PROVIDER: Zilphia Hilt, Charyl Coppersmith, MD  REFERRING DIAG: M54.2 (ICD-10-CM) - Neck pain R20.0,R20.2 (ICD-10-CM) - Numbness and tingling of right arm  THERAPY DIAG:  Cervicalgia  Pain in right arm  Cramp and spasm  Abnormal posture  Pain in left arm  Rationale for Evaluation and Treatment: Rehabilitation  ONSET DATE: February 17 2023  SUBJECTIVE:  SUBJECTIVE STATEMENT: Still working to schedule the MRI.  I will see the MD after I have the MRI.  My arm pain is worse over the past few days.  More frequent.   From evaluation:  Patient presents with increased neck pain that radiates down her right arm that began January 2025. She was helping a patient out of bed and she felt the pain afterwards in her neck and down her Rt arm.  The pain comes in waves but pain is constant. It is a pins and needles sensation in her Rt arm and an achy pain in her neck area. She feels her right arm is weaker and she can barely write because of the pain. Neck pain mainly on the right side but she will occasionally feel pain on the Lt . She is not currently sleeping well she wakes up 4-5x a night. She sleeps with a heating pad to help with sleep. Toradol  shots did help but the pain was still there. She is currently on light duty on work; lifting < 10lbs. When she went to her PCP 03/10/2023 she stretched her neck and that made her pain worst. April 2024 she had a workplace injury and injured her back and she feels that injury may have started her symptoms. Hand dominance: Right  PERTINENT HISTORY:  Anxiety; headaches; pre-diabetes; bipolar  PAIN: 04/19/23 Are you having pain? Yes: NPRS scale: 6/10 Pain location: Right side of neck; down full Rt arm, Lt neck Pain description: pins & needles;  achy Aggravating factors: laying on her back; laying on her right side; staying in same position for long periods of time Relieving factors: Nothing  PRECAUTIONS: None  RED FLAGS: None    WEIGHT BEARING RESTRICTIONS: No  FALLS:  Has patient fallen in last 6 months? 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  DIAGNOSTIC FINDINGS:  None  PATIENT SURVEYS:  NDI 36-50 72% 04/15/23: 42%  COGNITION: Overall cognitive status: Within functional limits for tasks assessed  SENSATION: Pins & needles down Rt arm  POSTURE: rounded shoulders and forward head  PALPATION: Increased muscle spasms & tenderness with bilateral upper trap palpation Increased muscle spasms of sub occipital palpation   CERVICAL ROM: *pain  Active ROM A/PROM (deg) eval A/ROM 04/15/23  Flexion 40   Extension 45   Right lateral flexion 30* 40  Left lateral flexion 35* 40  Right rotation 55 55  Left rotation 55 55   (Blank rows = not tested)  UPPER EXTREMITY ROM: WFL no pain   UPPER EXTREMITY MMT:*pain  MMT Right eval Left eval  Shoulder flexion 4* 4+  Shoulder extension    Shoulder abduction 4-* 4+  Shoulder adduction    Shoulder extension    Shoulder internal rotation    Shoulder external rotation    Middle trapezius    Lower trapezius    Elbow flexion 4 4+  Elbow extension 4 4+  Wrist flexion    Wrist extension    Wrist ulnar deviation    Wrist radial deviation    Wrist pronation    Wrist supination    Grip strength 30lbs 50lbs   (Blank  rows = not tested)  CERVICAL SPECIAL TESTS:  Upper limb tension test (ULTT):  Positive and Distraction test: Positive (+) Median & Ulnar Nerve ; (-) Radial N on Rt   FUNCTIONAL TESTS:  5 times sit to stand: 10.65sec no UE support  TREATMENT DATE:   04/19/23:  Arm Bike: Level 1.5x 6 min (3/3)-PT present to discuss progress  Wall clocks: blue loop x5 each  3 way raises: 1# 2x10 bil each  Cervical traction: 16/5# 60/10 seconds x10 min  04/15/23:  Arm Bike: Level 1.5x 6 min (3/3)-PT present to discuss progress  Cervical A/ROM: 3 ways 2x20 seconds- measurements taken after  Wall clocks: blue loop x5 each  3 way raises: 1# 2x10 bil each   Cervical traction: 16/5# 60/10 seconds x10 min 04/05/23:  Arm Bike: Level 1.5x 6 min (3/3)-PT present to discuss progress  Wall clocks: blue loop x5 each  Self snags on Rt x10 Seated chin tucks- x 5 increased pain in Rt UE Seated upper trap stretch 3x20 seconds  Supine: red band: D2 and horizontal abduction, ER 2x10 bil each  Cervical traction: 16/5# 60/10 seconds x10 min    PATIENT EDUCATION:  Education details: DN handout; HEP; POC Person educated: Patient Education method: Explanation, Demonstration, and Handouts Education comprehension: verbalized understanding, returned demonstration, and needs further education  HOME EXERCISE PROGRAM: Access Code: [email or web address] URL: [email or web address] Date: 04/01/2023 Prepared by: Loetta Ringer  Exercises - Hooklying Neck Distraction and Traction  - 1 x daily - 7 x weekly - 1 sets - 5 reps - 10-15 hold - Supine Shoulder Horizontal Abduction with Resistance  - 2 x daily - 7 x weekly - 2 sets - 10 reps - Supine Bilateral Shoulder External Rotation with Resistance  - 2 x daily - 7 x weekly - 2 sets - 10 reps - Supine PNF D2 Flexion with Resistance  - 2 x daily - 7 x weekly - 2 sets - 10 reps - Seated Assisted Cervical Rotation with Towel  - 2 x daily - 7 x weekly - 1 sets - 5 reps - 5 hold - Seated Upper Trapezius Stretch  - 3 x daily - 7 x weekly - 1 sets - 3 reps - 20  hold  ASSESSMENT:  CLINICAL IMPRESSION: Pt reports 30-35% reduction in her Rt UE symptoms since the start of care.  She does report that pain has been worse in the arm since last session.  Pt reports that the nerve pain is less prominent. She is able to sleep better overall with fewer interruptions.   Neck Disability Index improved to 21/50 last session, goal met. Pt will get MRI soon and pt will be placed on hold until she has results.   PT monitored throughout session for symptoms. Patient will benefit from skilled PT to address the below impairments and improve overall function.   OBJECTIVE IMPAIRMENTS: decreased activity tolerance, decreased ROM, decreased strength, increased fascial restrictions, impaired perceived functional ability, increased muscle spasms, impaired sensation, impaired UE functional use, postural dysfunction, and pain.   ACTIVITY LIMITATIONS: carrying, lifting, sleeping, reach over head, and caring for others  PARTICIPATION LIMITATIONS: cleaning, laundry, driving, community activity, and occupation  PERSONAL FACTORS: Time since onset of injury/illness/exacerbation and 3+ comorbidities: anxiety; bipolar ;pre-diabetes are also affecting patient's functional outcome.   REHAB POTENTIAL: Good  CLINICAL DECISION MAKING: Evolving/moderate complexity  EVALUATION COMPLEXITY: Moderate   GOALS: Goals reviewed with patient? Yes  SHORT TERM GOALS: Target date: 04/01/2023  Patient will be independent with initial  HEP. Baseline:  Goal status: MET  2.  Patient will report > or = to 30% improvement in cervical radicular symptoms since starting PT. Baseline: 30-35% (04/15/23) Goal status: MET   LONG TERM GOALS: Target date: 04/22/2023  Patient will demonstrate independence in advanced HEP. Baseline:  Goal status: INITIAL  2.  Patient will report > or = to 50% improvement in cervical radicular symptoms since starting PT. Baseline: 30-35% (04/15/23) Goal status:  INITIAL  3.  Patient will verbalize and demonstrate self-care strategies to manage pain including tissue mobility practices and change of position. Baseline:  Goal status: INITIAL  4.  Patient will score < or = to 31/50 on NDI for decreased self perceived disability. Baseline: 21/50 (42%) 04/15/23 Goal status: MET     5.  Patient will be able to return to full work duties. Baseline: light duty (04/01/23) Goal status: INITIAL   PLAN:  PT FREQUENCY: 1x/week  PT DURATION: 6 weeks  PLANNED INTERVENTIONS: 97164- PT Re-evaluation, 97110-Therapeutic exercises, 97530- Therapeutic activity, 97112- Neuromuscular re-education, 97535- Self Care, 08657- Manual therapy, [id number]- Canalith repositioning, [id number]- Aquatic Therapy, 97014- Electrical stimulation (unattended), [id number]- Electrical stimulation (manual), [id number]- Vasopneumatic device, [id number]- Ultrasound, [id number]- Traction (mechanical), [id number]- Ionotophoresis 4mg /ml Dexamethasone , Patient/Family education, Balance training, Stair training, Taping, Dry Needling, Joint mobilization, Joint manipulation, Spinal manipulation, Spinal mobilization, Vestibular training, Cryotherapy, and Moist heat  PLAN FOR NEXT SESSION: assess response to treatment and repeat traction and dry needling if helpful.    Luella Sager, PT 04/19/23 12:53 PM  PHYSICAL THERAPY DISCHARGE SUMMARY  Visits from Start of Care: 7  Current functional level related to goals / functional outcomes: See above for current status.  Pt was waiting to get MRI and was placed on hold.     Remaining deficits: See above for most current status.     Education / Equipment: HEP   Patient agrees to discharge. Patient goals were partially met. Patient is being discharged due to not returning since the last visit.  [facility] [address] [place], [place] [address] Phone # [phone] Fax [phone]

## 2023-04-22 ENCOUNTER — Telehealth: Payer: Self-pay | Admitting: Internal Medicine

## 2023-04-22 NOTE — Telephone Encounter (Signed)
 Copied from CRM [id number]. Topic: Referral - Question >> Apr 22, 2023  8:10 AM Elizebeth Brooking wrote: Reason for CRM: Patient called in stating that her insurance does not cover the location for that was sent for the MRI, She stated she prefers this location for the referral to be sent  [facility] [address] [place], [place] [address]  [phone]  [email or web address] Facility Type: [facility], [facility] Facility, Radiology & Imaging Services License Number: [id number]/A  National Provider ID (NPI): [id number]

## 2023-04-23 ENCOUNTER — Ambulatory Visit (HOSPITAL_BASED_OUTPATIENT_CLINIC_OR_DEPARTMENT_OTHER)

## 2023-04-28 ENCOUNTER — Other Ambulatory Visit (HOSPITAL_BASED_OUTPATIENT_CLINIC_OR_DEPARTMENT_OTHER): Payer: Self-pay

## 2023-05-01 ENCOUNTER — Encounter: Payer: Self-pay | Admitting: Internal Medicine

## 2023-05-01 ENCOUNTER — Other Ambulatory Visit (HOSPITAL_BASED_OUTPATIENT_CLINIC_OR_DEPARTMENT_OTHER): Payer: Self-pay

## 2023-05-05 ENCOUNTER — Other Ambulatory Visit (HOSPITAL_BASED_OUTPATIENT_CLINIC_OR_DEPARTMENT_OTHER): Payer: Self-pay

## 2023-05-10 ENCOUNTER — Other Ambulatory Visit (HOSPITAL_BASED_OUTPATIENT_CLINIC_OR_DEPARTMENT_OTHER): Payer: Self-pay

## 2023-05-10 ENCOUNTER — Ambulatory Visit: Admitting: Internal Medicine

## 2023-05-10 ENCOUNTER — Encounter: Payer: Self-pay | Admitting: Internal Medicine

## 2023-05-10 VITALS — BP 102/64 | HR 70 | Temp 98.0°F | Wt 166.9 lb

## 2023-05-10 DIAGNOSIS — Z7984 Long term (current) use of oral hypoglycemic drugs: Secondary | ICD-10-CM

## 2023-05-10 DIAGNOSIS — M5412 Radiculopathy, cervical region: Secondary | ICD-10-CM | POA: Diagnosis not present

## 2023-05-10 DIAGNOSIS — E1169 Type 2 diabetes mellitus with other specified complication: Secondary | ICD-10-CM | POA: Diagnosis not present

## 2023-05-10 DIAGNOSIS — E785 Hyperlipidemia, unspecified: Secondary | ICD-10-CM

## 2023-05-10 DIAGNOSIS — Z7985 Long-term (current) use of injectable non-insulin antidiabetic drugs: Secondary | ICD-10-CM

## 2023-05-10 MED ORDER — ROSUVASTATIN CALCIUM 5 MG PO TABS
5.0000 mg | ORAL_TABLET | Freq: Every day | ORAL | 1 refills | Status: AC
  Filled 2023-05-10: qty 90, 90d supply, fill #0

## 2023-05-10 NOTE — Progress Notes (Signed)
 Established Patient Office Visit     CC/Reason for Visit: Continued right neck right arm pain and tingling  HPI: Jasmine Huerta is a 38 y.o. female who is coming in today for the above mentioned reasons. Past Medical History is significant for: Hyperlipidemia, type 2 diabetes.  She is under the care of endocrinology.  This is now her third visit for cervical radiculitis.  She has now done 7 sessions of physical therapy over the course of 7 weeks without much improvement.  She has tried over-the-counter and prescription anti-inflammatory drugs.   Past Medical/Surgical History: Past Medical History:  Diagnosis Date   Anxiety    Arthritis    "My Dr said I do , I dont know."   Bipolar disorder (HCC) age 63   Depression    GDM (gestational diabetes mellitus)    history of GDM   GERD (gastroesophageal reflux disease)    Headache    migraines - 07/02/17-  none recently   Pre-diabetes     Past Surgical History:  Procedure Laterality Date   CHOLECYSTECTOMY N/A 07/05/2017   Procedure: LAPAROSCOPIC CHOLECYSTECTOMY;  Surgeon: Berna Bue, MD;  Location: [facility] OR;  Service: General;  Laterality: N/A;   TUBAL LIGATION  09/17/2011   Procedure: POST PARTUM TUBAL LIGATION;  Surgeon: Purcell Nails, MD;  Location: [facility];  Service: Gynecology;  Laterality: Bilateral;   WISDOM TOOTH EXTRACTION      Social History:  reports that she has never smoked. She has never used smokeless tobacco. She reports that she does not drink alcohol and does not use drugs.  Allergies: No Known Allergies  Family History:  Family History  Problem Relation Age of Onset   Heart disease Sister        whole in heart at birth   Anemia Mother    Hypertension Mother    Diabetes Mother    Thyroid disease Mother    Hypertension Maternal Grandfather    Anemia Daughter    Anemia Sister    Asthma Sister    Diabetes Maternal Uncle    Diabetes Maternal Grandmother    Thyroid disease Cousin    Seizures Sister       Current Outpatient Medications:    amphetamine-dextroamphetamine (ADDERALL XR) 30 MG 24 hr capsule, Take 1 capsule (30 mg total) by mouth every morning., Disp: 30 capsule, Rfl: 0   amphetamine-dextroamphetamine (ADDERALL XR) 30 MG 24 hr capsule, Take 1 capsule (30 mg total) by mouth every morning., Disp: 30 capsule, Rfl: 0   cetirizine (ZYRTEC) 10 MG tablet, Take 1 tablet (10 mg total) by mouth daily as needed for allergies., Disp: 90 tablet, Rfl: 1   Continuous Glucose Sensor (FREESTYLE LIBRE 3 PLUS SENSOR) MISC, Use 1 sensor every 14 days., Disp: 2 each, Rfl: 11   Continuous Glucose Sensor (FREESTYLE LIBRE 3 SENSOR) MISC, Inject 1 each into the skin every 14 (fourteen) days., Disp: 2 each, Rfl: 11   dicyclomine (BENTYL) 20 MG tablet, TAKE 1 TABLET (20 MG TOTAL) BY MOUTH 3 (THREE) TIMES DAILY AS NEEDED FOR SPASMS., Disp: 270 tablet, Rfl: 1   Dulaglutide (TRULICITY) 0.75 MG/0.5ML SOAJ, Inject 0.75 mg into the skin every 7 (seven) days., Disp: 2 mL, Rfl: 1   Dulaglutide (TRULICITY) 0.75 MG/0.5ML SOAJ, Inject 0.75 mg into the skin once a week., Disp: 2 mL, Rfl: 1   escitalopram (LEXAPRO) 10 MG tablet, Take 1 tablet (10 mg total) by mouth at bedtime., Disp: 30 tablet, Rfl: 3  meloxicam (MOBIC) 7.5 MG tablet, Take 1 tablet (7.5 mg total) by mouth daily., Disp: 30 tablet, Rfl: 0   metFORMIN (GLUCOPHAGE-XR) 500 MG 24 hr tablet, Take 1 tablet (500 mg total) by mouth every evening meal., Disp: 90 tablet, Rfl: 3   methocarbamol (ROBAXIN) 500 MG tablet, Take 1 tablet (500 mg total) by mouth every 8 (eight) hours as needed for muscle spasms., Disp: 30 tablet, Rfl: 1   pantoprazole (PROTONIX) 40 MG tablet, Take 1 tablet (40 mg total) by mouth daily., Disp: 90 tablet, Rfl: 1   rosuvastatin (CRESTOR) 5 MG tablet, Take 1 tablet (5 mg total) by mouth daily., Disp: 90 tablet, Rfl: 1  Review of Systems:  Negative unless indicated in HPI.   Physical Exam: Vitals:   05/10/23 1111  BP: 102/64  Pulse: 70   Temp: 98 F (36.7 C)  TempSrc: Oral  SpO2: 98%  Weight: 166 lb 14.4 oz (75.7 kg)    Body mass index is 30.04 kg/m.    Impression and Plan:  Type 2 diabetes mellitus with other specified complication, without long-term current use of insulin (HCC)  Hyperlipidemia associated with type 2 diabetes mellitus (HCC) -     Rosuvastatin Calcium; Take 1 tablet (5 mg total) by mouth daily.  Dispense: 90 tablet; Refill: 1  Cervical radiculitis  -Request MRI of cervical spine.  She continues to have pain numbness and tingling of her neck, right arm and right leg despite 7 weeks of conservative therapy. -She is under the care of of endocrinology, has been started on Trulicity.  Needs to be restarted on the statin.   Time spent:31 minutes reviewing chart, interviewing and examining patient and formulating plan of care.     Chaya Jan, MD [facility]

## 2023-05-11 ENCOUNTER — Other Ambulatory Visit (HOSPITAL_BASED_OUTPATIENT_CLINIC_OR_DEPARTMENT_OTHER): Payer: Self-pay

## 2023-05-11 MED ORDER — AMPHETAMINE-DEXTROAMPHET ER 30 MG PO CP24
30.0000 mg | ORAL_CAPSULE | Freq: Every morning | ORAL | 0 refills | Status: DC
  Filled 2023-05-14: qty 30, 30d supply, fill #0

## 2023-05-11 NOTE — Telephone Encounter (Signed)
 Done

## 2023-05-14 ENCOUNTER — Other Ambulatory Visit (HOSPITAL_BASED_OUTPATIENT_CLINIC_OR_DEPARTMENT_OTHER): Payer: Self-pay

## 2023-05-14 ENCOUNTER — Other Ambulatory Visit: Payer: Self-pay

## 2023-06-08 ENCOUNTER — Other Ambulatory Visit (HOSPITAL_BASED_OUTPATIENT_CLINIC_OR_DEPARTMENT_OTHER): Payer: Self-pay

## 2023-06-09 ENCOUNTER — Other Ambulatory Visit (HOSPITAL_BASED_OUTPATIENT_CLINIC_OR_DEPARTMENT_OTHER): Payer: Self-pay

## 2023-06-09 MED ORDER — OZEMPIC (0.25 OR 0.5 MG/DOSE) 2 MG/3ML ~~LOC~~ SOPN
0.2500 mg | PEN_INJECTOR | SUBCUTANEOUS | 3 refills | Status: AC
Start: 2023-06-09 — End: ?
  Filled 2023-06-09: qty 3, 56d supply, fill #0

## 2023-06-10 ENCOUNTER — Other Ambulatory Visit (HOSPITAL_BASED_OUTPATIENT_CLINIC_OR_DEPARTMENT_OTHER): Payer: Self-pay

## 2023-06-11 ENCOUNTER — Other Ambulatory Visit (HOSPITAL_BASED_OUTPATIENT_CLINIC_OR_DEPARTMENT_OTHER): Payer: Self-pay

## 2023-06-12 ENCOUNTER — Other Ambulatory Visit (HOSPITAL_BASED_OUTPATIENT_CLINIC_OR_DEPARTMENT_OTHER): Payer: Self-pay

## 2023-06-15 ENCOUNTER — Other Ambulatory Visit (HOSPITAL_BASED_OUTPATIENT_CLINIC_OR_DEPARTMENT_OTHER): Payer: Self-pay

## 2023-06-15 MED ORDER — ESCITALOPRAM OXALATE 5 MG PO TABS
5.0000 mg | ORAL_TABLET | Freq: Every day | ORAL | 0 refills | Status: DC
Start: 2023-06-15 — End: 2023-07-06
  Filled 2023-06-15: qty 14, 14d supply, fill #0

## 2023-06-15 MED ORDER — AMPHETAMINE-DEXTROAMPHETAMINE 20 MG PO TABS
20.0000 mg | ORAL_TABLET | Freq: Every day | ORAL | 0 refills | Status: DC
  Filled 2023-06-15: qty 30, 30d supply, fill #0

## 2023-06-15 MED ORDER — CITALOPRAM HYDROBROMIDE 10 MG PO TABS
ORAL_TABLET | ORAL | 0 refills | Status: DC
  Filled 2023-06-15: qty 42, 28d supply, fill #0

## 2023-06-23 ENCOUNTER — Encounter: Payer: Self-pay | Admitting: Internal Medicine

## 2023-06-24 ENCOUNTER — Ambulatory Visit
Admission: RE | Admit: 2023-06-24 | Discharge: 2023-06-24 | Disposition: A | Discharge status: Home / Self Care | Source: Ambulatory Visit | Attending: Nurse Practitioner | Admitting: Nurse Practitioner

## 2023-06-24 ENCOUNTER — Other Ambulatory Visit: Payer: Self-pay | Admitting: Nurse Practitioner

## 2023-06-24 DIAGNOSIS — R52 Pain, unspecified: Secondary | ICD-10-CM

## 2023-06-25 LAB — HEMOGLOBIN A1C: Hemoglobin A1C: 7.1

## 2023-07-06 ENCOUNTER — Other Ambulatory Visit (HOSPITAL_BASED_OUTPATIENT_CLINIC_OR_DEPARTMENT_OTHER): Payer: Self-pay

## 2023-07-06 ENCOUNTER — Encounter: Payer: Self-pay | Admitting: Internal Medicine

## 2023-07-06 ENCOUNTER — Encounter: Admitting: Internal Medicine

## 2023-07-06 ENCOUNTER — Telehealth: Payer: Self-pay | Admitting: Internal Medicine

## 2023-07-06 ENCOUNTER — Other Ambulatory Visit: Payer: Self-pay | Admitting: Internal Medicine

## 2023-07-06 DIAGNOSIS — R9389 Abnormal findings on diagnostic imaging of other specified body structures: Secondary | ICD-10-CM

## 2023-07-06 MED ORDER — MELOXICAM 7.5 MG PO TABS
7.5000 mg | ORAL_TABLET | Freq: Every day | ORAL | 0 refills | Status: AC
  Filled 2023-07-06 – 2023-07-24 (×2): qty 30, 30d supply, fill #0

## 2023-07-06 NOTE — Telephone Encounter (Signed)
 Patient is aware and referral was placed.

## 2023-07-06 NOTE — Progress Notes (Signed)
 This encounter was created in error - please disregard.

## 2023-07-06 NOTE — Addendum Note (Signed)
 Addended by: Nicolina Barrios B on: 07/06/2023 04:05 PM   Modules accepted: Orders

## 2023-07-06 NOTE — Telephone Encounter (Signed)
 Received MRI results. She has multilevel mild degenerative changes and a tiny disc protrusion C7-T1. Doubt this is causing any of her symptoms but can offer referral to neurosurgery if she would like.

## 2023-07-16 ENCOUNTER — Other Ambulatory Visit (HOSPITAL_BASED_OUTPATIENT_CLINIC_OR_DEPARTMENT_OTHER): Payer: Self-pay

## 2023-07-22 ENCOUNTER — Other Ambulatory Visit (HOSPITAL_BASED_OUTPATIENT_CLINIC_OR_DEPARTMENT_OTHER): Payer: Self-pay

## 2023-07-22 MED ORDER — AMPHETAMINE-DEXTROAMPHETAMINE 20 MG PO TABS
20.0000 mg | ORAL_TABLET | Freq: Every day | ORAL | 0 refills | Status: AC
  Filled 2023-07-22: qty 30, 30d supply, fill #0

## 2023-07-22 MED ORDER — BUSPIRONE HCL 7.5 MG PO TABS
7.5000 mg | ORAL_TABLET | Freq: Two times a day (BID) | ORAL | 3 refills | Status: AC
  Filled 2023-07-22: qty 60, 30d supply, fill #0
  Filled 2023-09-08: qty 60, 30d supply, fill #1

## 2023-07-22 MED ORDER — AMPHETAMINE-DEXTROAMPHETAMINE 20 MG PO TABS
20.0000 mg | ORAL_TABLET | Freq: Every day | ORAL | 0 refills | Status: DC
  Filled 2023-08-19: qty 30, 30d supply, fill #0

## 2023-07-26 ENCOUNTER — Other Ambulatory Visit (HOSPITAL_BASED_OUTPATIENT_CLINIC_OR_DEPARTMENT_OTHER): Payer: Self-pay

## 2023-07-29 NOTE — Therapy (Addendum)
 OUTPATIENT PHYSICAL THERAPY LOWER EXTREMITY EVALUATION   Patient Name: Jasmine Huerta MRN: [id number] DOB:July 14, 1985, 38 y.o., female Today's Date: 07/30/2023, female Today's Date: 07/30/2023  END OF SESSION:  PT End of Session - 07/30/23 0924     Visit Number 1    Number of Visits 12    Date for PT Re-Evaluation 09/10/23    Authorization Type work comp    Authorization - Visit Number 1    Authorization - Number of Visits 12    PT Start Time [phone]    PT Stop Time 0924    PT Time Calculation (min) 38 min    Activity Tolerance Patient tolerated treatment well    Behavior During Therapy WFL for tasks assessed/performed          Past Medical History:  Diagnosis Date   Anxiety    Arthritis    My Dr said I do , I dont know.   Bipolar disorder [facility]) age 38   Depression    GDM (gestational diabetes mellitus)    history of GDM   GERD (gastroesophageal reflux disease)    Headache    migraines - 07/02/17-  none recently   Pre-diabetes    Past Surgical History:  Procedure Laterality Date   CHOLECYSTECTOMY N/A 07/05/2017   Procedure: LAPAROSCOPIC CHOLECYSTECTOMY;  Surgeon: Signe Mitzie LABOR, MD;  Location: [facility] OR;  Service: General;  Laterality: N/A;   TUBAL LIGATION  09/17/2011   Procedure: POST PARTUM TUBAL LIGATION;  Surgeon: Jon CINDERELLA Rummer, MD;  Location: [facility];  Service: Gynecology;  Laterality: Bilateral;   WISDOM TOOTH EXTRACTION     Patient Active Problem List   Diagnosis Date Noted   Bipolar disorder (HCC) 02/25/2023   Increased frequency of urination 02/25/2023   Cervical radiculitis 02/25/2023   Hyperlipidemia associated with type 2 diabetes mellitus (HCC) 12/03/2021   Vitamin D  deficiency 12/03/2021   DM (diabetes mellitus), type 2 (HCC) 06/07/2018   NSVD (normal spontaneous vaginal delivery) 09/16/2011   Noncompliance with medications 08/25/2011   Noncompliance of patient with dietary regimen 08/25/2011   GDM (gestational diabetes mellitus) 08/25/2011   Gestational diabetes 07/16/2011   H/O  gestational diabetes in prior pregnancy, currently pregnant 06/10/2011    PCP: Theophilus Andrews, Tully CINDERELLA, MD   REFERRING PROVIDER: Yvone Rush, MD   REFERRING DIAG: B patellofemoral pain syndrome  THERAPY DIAG:  Pain in both knees, unspecified chronicity - Plan: PT plan of care cert/re-cert  Muscle weakness (generalized) - Plan: PT plan of care cert/re-cert  Cramp and spasm - Plan: PT plan of care cert/re-cert  Rationale for Evaluation and Treatment: Rehabilitation  ONSET DATE: 06/19/23  SUBJECTIVE:   SUBJECTIVE STATEMENT: Was on light duty for her neck/shoulder. She slipped on water  and hurt both knees. L> R. Right only about 1/10 and hurts with overdoing things. She has pulling in the back of the leg on the left with knee extension. Like cleaning out my pantry for 2 hours the other day and then the pain was really intense. Previously hurt L knee on 11/18/22 when a patient fell and her leg got caught up in the w/c. Did have back pain immediately after fall, but not longer hurting. Going up the stairs is worse. Crunching and needs to take a break - fatigue type of hurt. Just the knees, not thighs.  PERTINENT HISTORY: DM, ADHD, neck pain PAIN:  Are you having pain? Yes: NPRS scale: 5/10 up to 8/10 Pain location: L hip to ankle Pain description: intermittent sharp, stabbing Aggravating factors: overactivity  Relieving factors: ice and elevation  PRECAUTIONS: None  RED FLAGS: None   WEIGHT BEARING RESTRICTIONS: No  FALLS:  Has patient fallen in last 6 months? No  LIVING ENVIRONMENT: Lives with: lives with their spouse Lives in: House/apartment Stairs: Yes: Internal: 13 steps; on left going up and External: 1 steps; none Has following equipment at home: None  OCCUPATION: Nurse tech  PLOF: Independent  PATIENT GOALS: clear to be full duty so she can go back to work  NEXT MD VISIT: 08/26/23  OBJECTIVE:  Note: Objective measures were completed at Evaluation unless  otherwise noted.  DIAGNOSTIC FINDINGS: XR - mild OA   PATIENT SURVEYS:  LEFS  33 / 80 = 41.3 %   COGNITION: Overall cognitive status: Within functional limits for tasks assessed     MUSCLE LENGTH: Tight HS B, gastroc  POSTURE: increased lumbar lordosis and hyperextension of B knees  PALPATION: unremarkable  LOWER EXTREMITY ROM:  Active ROM Right eval Left eval  Hip flexion 5 5  Hip extension 5 5  Hip abduction 5 5  Hip adduction    Hip internal rotation    Hip external rotation    Knee flexion 4+ 5  Knee extension 4+ 5  Ankle dorsiflexion 5 5  Ankle plantarflexion    Ankle inversion    Ankle eversion     (Blank rows = not tested)  LOWER EXTREMITY MMT:  MMT Right eval Left eval  Knee flexion 133 133  Knee extension 0 0   (Blank rows = not tested)   FUNCTIONAL TESTS:  Squats - cues for correct form - demos some hip ADDuction Split squat - weak R>L SLS x 10 sec B                                                                                                                               TREATMENT DATE:   07/30/23 See pt ed and HEP   PATIENT EDUCATION:  Education details: PT eval findings, anticipated POC, initial HEP, and discussion of foam rollers   Person educated: Patient Education method: Explanation, Demonstration, Tactile cues, Verbal cues, and Handouts Education comprehension: verbalized understanding and returned demonstration  HOME EXERCISE PROGRAM: Access Code: [email or web address] URL: [email or web address] Date: 07/30/2023 Prepared by: Mliss  Exercises - Quadriceps Mobilization with Foam Roll  - 1 x daily - 7 x weekly - 1 sets - 10 reps - Sidelying IT Band Foam Roll Mobilization  - 1 x daily - 7 x weekly - 1 sets - 10 reps - Gastroc Stretch on Wall  - 2 x daily - 7 x weekly - 1 sets - 3 reps - 30-60 sec hold  ASSESSMENT:  CLINICAL IMPRESSION: Patient is a 38 y.o. female who was seen today for physical therapy evaluation and  treatment for B PFPS s/p a slip and fall at work on a wet floor. She has a h/o L knee pain since a fall at work in  October 2024. Patient has full B knee ROM, but tightness in her gastocs and HS. She has pain mainly with prolonged activity and stairs. No specific TTP, just general soreness and tightness reported. Negative knee special tests. Some weakness in her R Knee with MMT and she demonstrates functional hip weakness with squats and split squats. Her LEFS indicates 41% functionality. She will benefit from skilled PT to address these deficits.    OBJECTIVE IMPAIRMENTS: decreased strength, increased fascial restrictions, increased muscle spasms, impaired flexibility, and pain.   ACTIVITY LIMITATIONS: sitting, standing, squatting, stairs, and locomotion level  PARTICIPATION LIMITATIONS: meal prep, cleaning, laundry, shopping, occupation, and yard work  PERSONAL FACTORS: Past/current experiences and 1-2 comorbidities: previous L knee injury and Neck/back pain are also affecting patient's functional outcome.   REHAB POTENTIAL: Good  CLINICAL DECISION MAKING: Stable/uncomplicated  EVALUATION COMPLEXITY: Low   GOALS: Goals reviewed with patient? Yes  SHORT TERM GOALS: Target date: 08/20/2023    Ind with initial HEP Baseline: Goal status: INITIAL  2.  Decreased knee pain by 25% with ADLS. Baseline:  Goal status: INITIAL   LONG TERM GOALS: Target date: 09/10/2023   Ind with advanced HEP and its progression Baseline:  Goal status: INITIAL  2. Decreased knee pain by 75% with ADLS to allow RTW. Baseline:  Goal status: INITIAL  3.  Improved LE strength allowing for full squat and split squat with correct form. Baseline:  Goal status: INITIAL  4.  Able to climb stairs multiple times per day without increased pain or fatigue. Baseline:  Goal status: INITIAL  5.  Improved LEFS to 42 points showing functional improvement Baseline: 33 / 80 = 41.3 % Goal status:INITIAL     PLAN:  PT FREQUENCY: 2x/week  PT DURATION: 6 weeks  PLANNED INTERVENTIONS: 97164- PT Re-evaluation, 97110-Therapeutic exercises, 97530- Therapeutic activity, 97112- Neuromuscular re-education, 97535- Self Care, 02859- Manual therapy, [phone]- Aquatic Therapy, G0283- Electrical stimulation (unattended), 97035- Ultrasound, [id number]- Ionotophoresis 4mg /ml Dexamethasone , 79439 (1-2 muscles), 20561 (3+ muscles)- Dry Needling, Patient/Family education, Balance training, Stair training, Taping, Joint mobilization, Cryotherapy, and Moist heat  PLAN FOR NEXT SESSION: continue with foam rolling, IASTM prn to quads, calf/HS stretches, functional strengthening for LE as tolerated   Mliss Cummins, PT 07/30/23 10:08 AM

## 2023-07-30 ENCOUNTER — Other Ambulatory Visit: Payer: Self-pay

## 2023-07-30 ENCOUNTER — Encounter: Payer: Self-pay | Admitting: Physical Therapy

## 2023-07-30 ENCOUNTER — Ambulatory Visit: Attending: Orthopedic Surgery | Admitting: Physical Therapy

## 2023-07-30 DIAGNOSIS — M25561 Pain in right knee: Secondary | ICD-10-CM | POA: Insufficient documentation

## 2023-07-30 DIAGNOSIS — M25562 Pain in left knee: Secondary | ICD-10-CM | POA: Diagnosis present

## 2023-07-30 DIAGNOSIS — R252 Cramp and spasm: Secondary | ICD-10-CM | POA: Diagnosis present

## 2023-07-30 DIAGNOSIS — M6281 Muscle weakness (generalized): Secondary | ICD-10-CM | POA: Insufficient documentation

## 2023-08-02 ENCOUNTER — Encounter: Payer: Self-pay | Admitting: Rehabilitative and Restorative Service Providers"

## 2023-08-02 ENCOUNTER — Ambulatory Visit: Attending: Orthopedic Surgery | Admitting: Rehabilitative and Restorative Service Providers"

## 2023-08-02 DIAGNOSIS — M25562 Pain in left knee: Secondary | ICD-10-CM | POA: Diagnosis present

## 2023-08-02 DIAGNOSIS — M25561 Pain in right knee: Secondary | ICD-10-CM | POA: Diagnosis present

## 2023-08-02 DIAGNOSIS — M6281 Muscle weakness (generalized): Secondary | ICD-10-CM | POA: Diagnosis present

## 2023-08-02 DIAGNOSIS — R252 Cramp and spasm: Secondary | ICD-10-CM | POA: Diagnosis present

## 2023-08-02 NOTE — Therapy (Signed)
 OUTPATIENT PHYSICAL THERAPY TREATMENT NOTE   Patient Name: Jasmine Huerta MRN: [id number] DOB:1985/09/05, 38 y.o., female Today's Date: 08/02/2023  END OF SESSION:  PT End of Session - 08/02/23 1232     Visit Number 2    Date for PT Re-Evaluation 09/10/23    Authorization Type work comp    Authorization - Visit Number 2    Authorization - Number of Visits 13    PT Start Time 1230    PT Stop Time 1310    PT Time Calculation (min) 40 min    Activity Tolerance Patient tolerated treatment well    Behavior During Therapy WFL for tasks assessed/performed          Past Medical History:  Diagnosis Date   Anxiety    Arthritis    My Dr said I do , I dont know.   Bipolar disorder [facility]) age 74   Depression    GDM (gestational diabetes mellitus)    history of GDM   GERD (gastroesophageal reflux disease)    Headache    migraines - 07/02/17-  none recently   Pre-diabetes    Past Surgical History:  Procedure Laterality Date   CHOLECYSTECTOMY N/A 07/05/2017   Procedure: LAPAROSCOPIC CHOLECYSTECTOMY;  Surgeon: Signe Mitzie LABOR, MD;  Location: [facility] OR;  Service: General;  Laterality: N/A;   TUBAL LIGATION  09/17/2011   Procedure: POST PARTUM TUBAL LIGATION;  Surgeon: Jon CINDERELLA Rummer, MD;  Location: [facility];  Service: Gynecology;  Laterality: Bilateral;   WISDOM TOOTH EXTRACTION     Patient Active Problem List   Diagnosis Date Noted   Bipolar disorder (HCC) 02/25/2023   Increased frequency of urination 02/25/2023   Cervical radiculitis 02/25/2023   Hyperlipidemia associated with type 2 diabetes mellitus (HCC) 12/03/2021   Vitamin D  deficiency 12/03/2021   DM (diabetes mellitus), type 2 (HCC) 06/07/2018   NSVD (normal spontaneous vaginal delivery) 09/16/2011   Noncompliance with medications 08/25/2011   Noncompliance of patient with dietary regimen 08/25/2011   GDM (gestational diabetes mellitus) 08/25/2011   Gestational diabetes 07/16/2011   H/O gestational diabetes in prior  pregnancy, currently pregnant 06/10/2011    PCP: Theophilus Andrews, Tully CINDERELLA, MD   REFERRING PROVIDER: Yvone Rush, MD   REFERRING DIAG: B patellofemoral pain syndrome  THERAPY DIAG:  Pain in both knees, unspecified chronicity  Muscle weakness (generalized)  Cramp and spasm  Rationale for Evaluation and Treatment: Rehabilitation  ONSET DATE: 06/19/23  SUBJECTIVE:   SUBJECTIVE STATEMENT: Patient states that her left knee is hurting her more than her right.  PERTINENT HISTORY: DM, ADHD, neck pain  From Eval:  Was on light duty for her neck/shoulder. She slipped on water  and hurt both knees. L> R. Right only about 1/10 and hurts with overdoing things. She has pulling in the back of the leg on the left with knee extension. Like cleaning out my pantry for 2 hours the other day and then the pain was really intense. Previously hurt L knee on 11/18/22 when a patient fell and her leg got caught up in the w/c. Did have back pain immediately after fall, but not longer hurting. Going up the stairs is worse. Crunching and needs to take a break - fatigue type of hurt. Just the knees, not thighs.  PAIN:  Are you having pain? Yes: NPRS scale: 4/10 Pain location: L hip to ankle Pain description: intermittent sharp, stabbing Aggravating factors: overactivity Relieving factors: ice and elevation  PRECAUTIONS: None  RED FLAGS: None  WEIGHT BEARING RESTRICTIONS: No  FALLS:  Has patient fallen in last 6 months? No  LIVING ENVIRONMENT: Lives with: lives with their spouse Lives in: House/apartment Stairs: Yes: Internal: 13 steps; on left going up and External: 1 steps; none Has following equipment at home: None  OCCUPATION: Nurse tech  PLOF: Independent  PATIENT GOALS: clear to be full duty so she can go back to work  NEXT MD VISIT: 08/26/23  OBJECTIVE:  Note: Objective measures were completed at Evaluation unless otherwise noted.  DIAGNOSTIC FINDINGS: XR - mild OA    PATIENT SURVEYS:  Eval:  LEFS  33 / 80 = 41.3 %   COGNITION: Overall cognitive status: Within functional limits for tasks assessed     MUSCLE LENGTH: Tight HS B, gastroc  POSTURE: increased lumbar lordosis and hyperextension of B knees  PALPATION: unremarkable  LOWER EXTREMITY ROM:  Active ROM Right eval Left eval  Hip flexion 5 5  Hip extension 5 5  Hip abduction 5 5  Hip adduction    Hip internal rotation    Hip external rotation    Knee flexion 4+ 5  Knee extension 4+ 5  Ankle dorsiflexion 5 5  Ankle plantarflexion    Ankle inversion    Ankle eversion     (Blank rows = not tested)  LOWER EXTREMITY MMT:  MMT Right eval Left eval  Knee flexion 133 133  Knee extension 0 0   (Blank rows = not tested)   FUNCTIONAL TESTS:  Eval: Squats - cues for correct form - demos some hip ADDuction Split squat - weak R>L SLS x 10 sec B                                                                                                                               TREATMENT DATE:   08/02/2023: Nustep level 5 x6 min with PT present to discuss status Seated hamstring stretch 2x20 sec bilat Seated rocker board for DF/PF x1 min Standing rocker board for DF/PF x1 min Standing heel raise with focus on eccentric lowering 2x10 Side-lying IT band self mobilization on foam roller x1 min bilat Long sitting self mobilization roller on calf and quads Prone hamstring curl 2x10 bilat Prone quad stretch with strap x20 sec Side-lying clamshell 2x10 bilat Seated LAQ with hip adduction ball squeeze 2x10 bilat   07/30/23  See pt ed and HEP   PATIENT EDUCATION:  Education details: PT eval findings, anticipated POC, initial HEP, and discussion of foam rollers   Person educated: Patient Education method: Explanation, Demonstration, Tactile cues, Verbal cues, and Handouts Education comprehension: verbalized understanding and returned demonstration  HOME EXERCISE PROGRAM: Access  Code: [email or web address] URL: [email or web address] Date: 08/02/2023 Prepared by: Jarrell Taneshia Lorence  Exercises - Gastroc Stretch on Wall  - 2 x daily - 7 x weekly - 1 sets - 3 reps - 30-60 sec hold - Heel Toe Raises with Counter Support  - 1 x daily -  7 x weekly - 2 sets - 10 reps - Seated Hamstring Stretch  - 1 x daily - 7 x weekly - 2 reps - 20 sec hold - Quadriceps Mobilization with Foam Roll  - 1 x daily - 7 x weekly - 1 sets - 10 reps - Sidelying IT Band Foam Roll Mobilization  - 1 x daily - 7 x weekly - 1 sets - 10 reps - Clamshell  - 1 x daily - 7 x weekly - 2 sets - 10 reps - Active Straight Leg Raise with Quad Set  - 1 x daily - 7 x weekly - 2 sets - 10 reps - Prone Knee Flexion  - 1 x daily - 7 x weekly - 2 sets - 10 reps - Prone Quadriceps Stretch with Strap  - 1 x daily - 7 x weekly - 2 reps - 20 sec hold - Seated Long Arc Quad  - 1 x daily - 7 x weekly - 2 sets - 10 reps - 1 sec hold  ASSESSMENT:  CLINICAL IMPRESSION: Binta presents to skilled PT reporting that she got a roller and has been using it at home.  Patient states that she feels/hears her knees popping. Patient able to progress with treatment session and add in more flexibility and initiate strengthening exercises without weight.  Will work to slowly progress improved strengthening each session.  Provided patient with an updated HEP print-out and sent patient a text alert if she would like to download the app.  Patient unable to perform hamstring stretch in standing due to strain on knee, but was able to perform in seated position, so added this to her HEP.  Patient continues to require skilled PT to progress towards goal related activities and return to work.  OBJECTIVE IMPAIRMENTS: decreased strength, increased fascial restrictions, increased muscle spasms, impaired flexibility, and pain.   ACTIVITY LIMITATIONS: sitting, standing, squatting, stairs, and locomotion level  PARTICIPATION LIMITATIONS: meal prep,  cleaning, laundry, shopping, occupation, and yard work  PERSONAL FACTORS: Past/current experiences and 1-2 comorbidities: previous L knee injury and Neck/back pain are also affecting patient's functional outcome.   REHAB POTENTIAL: Good  CLINICAL DECISION MAKING: Stable/uncomplicated  EVALUATION COMPLEXITY: Low   GOALS: Goals reviewed with patient? Yes  SHORT TERM GOALS: Target date: 08/20/2023    Ind with initial HEP Baseline: Goal status: Ongoing  2.  Decreased knee pain by 25% with ADLS. Baseline:  Goal status: INITIAL   LONG TERM GOALS: Target date: 09/10/2023   Ind with advanced HEP and its progression Baseline:  Goal status: INITIAL  2. Decreased knee pain by 75% with ADLS to allow RTW. Baseline:  Goal status: INITIAL  3.  Improved LE strength allowing for full squat and split squat with correct form. Baseline:  Goal status: INITIAL  4.  Able to climb stairs multiple times per day without increased pain or fatigue. Baseline:  Goal status: INITIAL  5.  Improved LEFS to 42 points showing functional improvement Baseline: 33 / 80 = 41.3 % Goal status:INITIAL    PLAN:  PT FREQUENCY: 2x/week  PT DURATION: 6 weeks  PLANNED INTERVENTIONS: 97164- PT Re-evaluation, 97110-Therapeutic exercises, 97530- Therapeutic activity, 97112- Neuromuscular re-education, 97535- Self Care, 02859- Manual therapy, [id number]- Aquatic Therapy, H9716- Electrical stimulation (unattended), [id number]- Ultrasound, 02966- Ionotophoresis 4mg /ml Dexamethasone , 79439 (1-2 muscles), 20561 (3+ muscles)- Dry Needling, Patient/Family education, Balance training, Stair training, Taping, Joint mobilization, Cryotherapy, and Moist heat  PLAN FOR NEXT SESSION: continue with foam rolling, IASTM prn to quads,  calf/HS stretches, functional strengthening for LE as tolerated   Jarrell Laming, PT, DPT 08/02/23, 1:21 PM  [facility] [address] [place], [place]  [address] Phone # [phone] Fax [phone]

## 2023-08-05 ENCOUNTER — Ambulatory Visit: Attending: Orthopedic Surgery | Admitting: Physical Therapy

## 2023-08-05 ENCOUNTER — Encounter: Payer: Self-pay | Admitting: Physical Therapy

## 2023-08-05 DIAGNOSIS — M6281 Muscle weakness (generalized): Secondary | ICD-10-CM | POA: Diagnosis present

## 2023-08-05 DIAGNOSIS — R252 Cramp and spasm: Secondary | ICD-10-CM | POA: Diagnosis present

## 2023-08-05 DIAGNOSIS — M25561 Pain in right knee: Secondary | ICD-10-CM | POA: Insufficient documentation

## 2023-08-05 DIAGNOSIS — M25562 Pain in left knee: Secondary | ICD-10-CM | POA: Insufficient documentation

## 2023-08-05 NOTE — Therapy (Signed)
 OUTPATIENT PHYSICAL THERAPY TREATMENT NOTE   Patient Name: Jasmine Huerta MRN: [id number] DOB:[birth year], 38 y.o., female Today's Date: 08/05/2023  END OF SESSION:  PT End of Session - 08/05/23 1343     Visit Number 3    Number of Visits 12    Date for PT Re-Evaluation 09/10/23    Authorization Type work comp    Authorization - Visit Number 3    Authorization - Number of Visits 13    PT Start Time 1233    PT Stop Time 1314    PT Time Calculation (min) 41 min    Activity Tolerance Patient tolerated treatment well    Behavior During Therapy WFL for tasks assessed/performed           Past Medical History:  Diagnosis Date   Anxiety    Arthritis    My Dr said I do , I dont know.   Bipolar disorder [facility]) age 62   Depression    GDM (gestational diabetes mellitus)    history of GDM   GERD (gastroesophageal reflux disease)    Headache    migraines - 07/02/17-  none recently   Pre-diabetes    Past Surgical History:  Procedure Laterality Date   CHOLECYSTECTOMY N/A 07/05/2017   Procedure: LAPAROSCOPIC CHOLECYSTECTOMY;  Surgeon: Signe Mitzie LABOR, MD;  Location: [facility];  Service: General;  Laterality: N/A;   TUBAL LIGATION  09/17/2011   Procedure: POST PARTUM TUBAL LIGATION;  Surgeon: Jon CINDERELLA Rummer, MD;  Location: [facility];  Service: Gynecology;  Laterality: Bilateral;   WISDOM TOOTH EXTRACTION     Patient Active Problem List   Diagnosis Date Noted   Bipolar disorder (HCC) 02/25/2023   Increased frequency of urination 02/25/2023   Cervical radiculitis 02/25/2023   Hyperlipidemia associated with type 2 diabetes mellitus (HCC) 12/03/2021   Vitamin D  deficiency 12/03/2021   DM (diabetes mellitus), type 2 (HCC) 06/07/2018   NSVD (normal spontaneous vaginal delivery) 09/16/2011   Noncompliance with medications 08/25/2011   Noncompliance of patient with dietary regimen 08/25/2011   GDM (gestational diabetes mellitus) 08/25/2011   Gestational diabetes 07/16/2011   H/O gestational  diabetes in prior pregnancy, currently pregnant 06/10/2011    PCP: Theophilus Andrews, Tully CINDERELLA, MD   REFERRING PROVIDER: Yvone Rush, MD   REFERRING DIAG: B patellofemoral pain syndrome  THERAPY DIAG:  Pain in both knees, unspecified chronicity  Muscle weakness (generalized)  Cramp and spasm  Rationale for Evaluation and Treatment: Rehabilitation  ONSET DATE: 06/19/23  SUBJECTIVE:   SUBJECTIVE STATEMENT: Patient presents with 1/10 pain today. She feels her calf is not as tight.  PERTINENT HISTORY: DM, ADHD, neck pain  From Eval:  Was on light duty for her neck/shoulder. She slipped on water  and hurt both knees. L> R. Right only about 1/10 and hurts with overdoing things. She has pulling in the back of the leg on the left with knee extension. Like cleaning out my pantry for 2 hours the other day and then the pain was really intense. Previously hurt L knee on 11/18/22 when a patient fell and her leg got caught up in the w/c. Did have back pain immediately after fall, but not longer hurting. Going up the stairs is worse. Crunching and needs to take a break - fatigue type of hurt. Just the knees, not thighs.  PAIN:  Are you having pain? Yes: NPRS scale: 4/10 Pain location: L hip to ankle Pain description: intermittent sharp, stabbing Aggravating factors: overactivity Relieving factors: ice and elevation  PRECAUTIONS: None  RED FLAGS: None   WEIGHT BEARING RESTRICTIONS: No  FALLS:  Has patient fallen in last 6 months? No  LIVING ENVIRONMENT: Lives with: lives with their spouse Lives in: House/apartment Stairs: Yes: Internal: 13 steps; on left going up and External: 1 steps; none Has following equipment at home: None  OCCUPATION: Nurse tech  PLOF: Independent  PATIENT GOALS: clear to be full duty so she can go back to work  NEXT MD VISIT: 08/26/23  OBJECTIVE:  Note: Objective measures were completed at Evaluation unless otherwise noted.  DIAGNOSTIC FINDINGS:  XR - mild OA   PATIENT SURVEYS:  Eval:  LEFS  33 / 80 = 41.3 %   COGNITION: Overall cognitive status: Within functional limits for tasks assessed     MUSCLE LENGTH: Tight HS B, gastroc  POSTURE: increased lumbar lordosis and hyperextension of B knees  PALPATION: unremarkable  LOWER EXTREMITY ROM:  Active ROM Right eval Left eval  Hip flexion 5 5  Hip extension 5 5  Hip abduction 5 5  Hip adduction    Hip internal rotation    Hip external rotation    Knee flexion 4+ 5  Knee extension 4+ 5  Ankle dorsiflexion 5 5  Ankle plantarflexion    Ankle inversion    Ankle eversion     (Blank rows = not tested)  LOWER EXTREMITY MMT:  MMT Right eval Left eval  Knee flexion 133 133  Knee extension 0 0   (Blank rows = not tested)   FUNCTIONAL TESTS:  Eval: Squats - cues for correct form - demos some hip ADDuction Split squat - weak R>L SLS x 10 sec B                                                                                                                               TREATMENT DATE:   08/05/2023: Recumbent Bike Level 2- 5 mins PT present to discuss status (some increase in pain but she was able to tolerate it) Seated hamstring stretch 2x20 sec bilat  Standing heel raise with focus on eccentric lowering 2x10  Iso hip abduction with ball press x 10 bilateral  Side-lying clamshell 2x10 bilat TA activation + SLR x 10 bilateral  Prone hamstring curl 2x10 bilat Prone quad stretch with strap 2x30 sec bilateral  Side-lying IT band self mobilization on foam roller x1 min bilat Calf stretch on rockerboard 2 x 30       08/02/2023: Nustep level 5 x6 min with PT present to discuss status Seated hamstring stretch 2x20 sec bilat Seated rocker board for DF/PF x1 min Standing rocker board for DF/PF x1 min Standing heel raise with focus on eccentric lowering 2x10 Side-lying IT band self mobilization on foam roller x1 min bilat Long sitting self mobilization roller on  calf and quads Prone hamstring curl 2x10 bilat Prone quad stretch with strap x20 sec Side-lying clamshell 2x10 bilat Seated LAQ with hip adduction  ball squeeze 2x10 bilat   07/30/23  See pt ed and HEP   PATIENT EDUCATION:  Education details: PT eval findings, anticipated POC, initial HEP, and discussion of foam rollers   Person educated: Patient Education method: Explanation, Demonstration, Tactile cues, Verbal cues, and Handouts Education comprehension: verbalized understanding and returned demonstration  HOME EXERCISE PROGRAM: Access Code: [email or web address] URL: [email or web address] Date: 08/02/2023 Prepared by: Jarrell Menke  Exercises - Gastroc Stretch on Wall  - 2 x daily - 7 x weekly - 1 sets - 3 reps - 30-60 sec hold - Heel Toe Raises with Counter Support  - 1 x daily - 7 x weekly - 2 sets - 10 reps - Seated Hamstring Stretch  - 1 x daily - 7 x weekly - 2 reps - 20 sec hold - Quadriceps Mobilization with Foam Roll  - 1 x daily - 7 x weekly - 1 sets - 10 reps - Sidelying IT Band Foam Roll Mobilization  - 1 x daily - 7 x weekly - 1 sets - 10 reps - Clamshell  - 1 x daily - 7 x weekly - 2 sets - 10 reps - Active Straight Leg Raise with Quad Set  - 1 x daily - 7 x weekly - 2 sets - 10 reps - Prone Knee Flexion  - 1 x daily - 7 x weekly - 2 sets - 10 reps - Prone Quadriceps Stretch with Strap  - 1 x daily - 7 x weekly - 2 reps - 20 sec hold - Seated Long Arc Quad  - 1 x daily - 7 x weekly - 2 sets - 10 reps - 1 sec hold  ASSESSMENT:  CLINICAL IMPRESSION: Lasandra presents to therapy with minimal pain. She verbalized feeling good after last session and her calfs feel looser. Incorporated knee stability and strengthening exercises. Patient verbalized some discomfort with recumbent bike and single leg hip abduction exercises. Required verbal cues for TA activation while performing SLR. Overall, patient tolerated treatment session well. Patient will benefit from skilled PT to  address the below impairments and improve overall function.   OBJECTIVE IMPAIRMENTS: decreased strength, increased fascial restrictions, increased muscle spasms, impaired flexibility, and pain.   ACTIVITY LIMITATIONS: sitting, standing, squatting, stairs, and locomotion level  PARTICIPATION LIMITATIONS: meal prep, cleaning, laundry, shopping, occupation, and yard work  PERSONAL FACTORS: Past/current experiences and 1-2 comorbidities: previous L knee injury and Neck/back pain are also affecting patient's functional outcome.   REHAB POTENTIAL: Good  CLINICAL DECISION MAKING: Stable/uncomplicated  EVALUATION COMPLEXITY: Low   GOALS: Goals reviewed with patient? Yes  SHORT TERM GOALS: Target date: 08/20/2023    Ind with initial HEP Baseline: Goal status: Ongoing  2.  Decreased knee pain by 25% with ADLS. Baseline:  Goal status: INITIAL   LONG TERM GOALS: Target date: 09/10/2023   Ind with advanced HEP and its progression Baseline:  Goal status: INITIAL  2. Decreased knee pain by 75% with ADLS to allow RTW. Baseline:  Goal status: INITIAL  3.  Improved LE strength allowing for full squat and split squat with correct form. Baseline:  Goal status: INITIAL  4.  Able to climb stairs multiple times per day without increased pain or fatigue. Baseline:  Goal status: INITIAL  5.  Improved LEFS to 42 points showing functional improvement Baseline: 33 / 80 = 41.3 % Goal status:INITIAL    PLAN:  PT FREQUENCY: 2x/week  PT DURATION: 6 weeks  PLANNED INTERVENTIONS: 97164- PT Re-evaluation, 97110-Therapeutic exercises, 97530- Therapeutic activity, 97112-  Neuromuscular re-education, [phone]- Self Care, 02859- Manual therapy, [phone]- Aquatic Therapy, [phone]- Electrical stimulation (unattended), [phone]- Ultrasound, [id number]- Ionotophoresis 4mg /ml Dexamethasone , 20560 (1-2 muscles), 20561 (3+ muscles)- Dry Needling, Patient/Family education, Balance training, Stair training, Taping, Joint  mobilization, Cryotherapy, and Moist heat  PLAN FOR NEXT SESSION: progress weights as tolerated; continue with foam rolling, IASTM prn to quads, calf/HS stretches, functional strengthening for LE as tolerated    Kristeen Sar, PT 08/05/23 1:44 PM [facility] [address] [place], [place] [address] Phone # [phone] Fax [phone]

## 2023-08-09 ENCOUNTER — Ambulatory Visit: Payer: Self-pay

## 2023-08-09 DIAGNOSIS — M25561 Pain in right knee: Secondary | ICD-10-CM | POA: Diagnosis not present

## 2023-08-09 DIAGNOSIS — M6281 Muscle weakness (generalized): Secondary | ICD-10-CM

## 2023-08-09 DIAGNOSIS — R252 Cramp and spasm: Secondary | ICD-10-CM

## 2023-08-09 DIAGNOSIS — M25562 Pain in left knee: Secondary | ICD-10-CM

## 2023-08-09 NOTE — Therapy (Signed)
 OUTPATIENT PHYSICAL THERAPY TREATMENT NOTE   Patient Name: Jasmine Huerta MRN: [id number] DOB:1985-03-20, 38 y.o., female Today's Date: 08/09/2023  END OF SESSION:  PT End of Session - 08/09/23 1330     Visit Number 4    Date for PT Re-Evaluation 09/10/23    Authorization Type work comp    Authorization - Visit Number 4    Authorization - Number of Visits 13    PT Start Time 1232    PT Stop Time 1314    PT Time Calculation (min) 42 min    Activity Tolerance Patient tolerated treatment well    Behavior During Therapy WFL for tasks assessed/performed            Past Medical History:  Diagnosis Date   Anxiety    Arthritis    My Dr said I do , I dont know.   Bipolar disorder [facility]) age 85   Depression    GDM (gestational diabetes mellitus)    history of GDM   GERD (gastroesophageal reflux disease)    Headache    migraines - 07/02/17-  none recently   Pre-diabetes    Past Surgical History:  Procedure Laterality Date   CHOLECYSTECTOMY N/A 07/05/2017   Procedure: LAPAROSCOPIC CHOLECYSTECTOMY;  Surgeon: Signe Mitzie LABOR, MD;  Location: [facility];  Service: General;  Laterality: N/A;   TUBAL LIGATION  09/17/2011   Procedure: POST PARTUM TUBAL LIGATION;  Surgeon: Jon CINDERELLA Rummer, MD;  Location: [facility];  Service: Gynecology;  Laterality: Bilateral;   WISDOM TOOTH EXTRACTION     Patient Active Problem List   Diagnosis Date Noted   Bipolar disorder (HCC) 02/25/2023   Increased frequency of urination 02/25/2023   Cervical radiculitis 02/25/2023   Hyperlipidemia associated with type 2 diabetes mellitus (HCC) 12/03/2021   Vitamin D  deficiency 12/03/2021   DM (diabetes mellitus), type 2 (HCC) 06/07/2018   NSVD (normal spontaneous vaginal delivery) 09/16/2011   Noncompliance with medications 08/25/2011   Noncompliance of patient with dietary regimen 08/25/2011   GDM (gestational diabetes mellitus) 08/25/2011   Gestational diabetes 07/16/2011   H/O gestational diabetes in prior  pregnancy, currently pregnant 06/10/2011    PCP: Theophilus Andrews, Tully CINDERELLA, MD   REFERRING PROVIDER: Yvone Rush, MD   REFERRING DIAG: B patellofemoral pain syndrome  THERAPY DIAG:  Pain in both knees, unspecified chronicity  Muscle weakness (generalized)  Cramp and spasm  Rationale for Evaluation and Treatment: Rehabilitation  ONSET DATE: 06/19/23  SUBJECTIVE:   SUBJECTIVE STATEMENT: My Lt knee hurts today.  My Rt knee just pops  PERTINENT HISTORY: DM, ADHD, neck pain  From Eval:  Was on light duty for her neck/shoulder. She slipped on water  and hurt both knees. L> R. Right only about 1/10 and hurts with overdoing things. She has pulling in the back of the leg on the left with knee extension. Like cleaning out my pantry for 2 hours the other day and then the pain was really intense. Previously hurt L knee on 11/18/22 when a patient fell and her leg got caught up in the w/c. Did have back pain immediately after fall, but not longer hurting. Going up the stairs is worse. Crunching and needs to take a break - fatigue type of hurt. Just the knees, not thighs.  PAIN:  Are you having pain? Yes: NPRS scale: 6-7/10 Pain location: L hip to ankle Pain description: intermittent sharp, stabbing Aggravating factors: overactivity Relieving factors: ice and elevation  PRECAUTIONS: None  RED FLAGS: None  WEIGHT BEARING RESTRICTIONS: No  FALLS:  Has patient fallen in last 6 months? No  LIVING ENVIRONMENT: Lives with: lives with their spouse Lives in: House/apartment Stairs: Yes: Internal: 13 steps; on left going up and External: 1 steps; none Has following equipment at home: None  OCCUPATION: Nurse tech  PLOF: Independent  PATIENT GOALS: clear to be full duty so she can go back to work  NEXT MD VISIT: 08/26/23  OBJECTIVE:  Note: Objective measures were completed at Evaluation unless otherwise noted.  DIAGNOSTIC FINDINGS: XR - mild OA   PATIENT SURVEYS:  Eval:   LEFS  33 / 80 = 41.3 %   COGNITION: Overall cognitive status: Within functional limits for tasks assessed     MUSCLE LENGTH: Tight HS B, gastroc  POSTURE: increased lumbar lordosis and hyperextension of B knees  PALPATION: unremarkable  LOWER EXTREMITY ROM:  Active ROM Right eval Left eval  Hip flexion 5 5  Hip extension 5 5  Hip abduction 5 5  Hip adduction    Hip internal rotation    Hip external rotation    Knee flexion 4+ 5  Knee extension 4+ 5  Ankle dorsiflexion 5 5  Ankle plantarflexion    Ankle inversion    Ankle eversion     (Blank rows = not tested)  LOWER EXTREMITY MMT:  MMT Right eval Left eval  Knee flexion 133 133  Knee extension 0 0   (Blank rows = not tested)   FUNCTIONAL TESTS:  Eval: Squats - cues for correct form - demos some hip ADDuction Split squat - weak R>L SLS x 10 sec B                                                                                                                               TREATMENT DATE:   08/09/2023: Recumbent Bike Level 2- 5 mins PT present to discuss status (some increase in pain but she was able to tolerate it) Seated hamstring stretch 2x20 sec bilat Standing rockerboard x 3 minutes Weight shifting 3 ways on balance pad 1 min each-verbal cues for neutral foot  Standing heel raise with focus on eccentric lowering 2x10 Short arc quads: 5 2x10  Side-lying clamshell 2x10 bilat TA activation + SLR x 10 bilateral  Prone hamstring curl 2x10 bilat Prone quad stretch with strap 2x30 sec bilateral   08/05/2023: Recumbent Bike Level 2- 5 mins PT present to discuss status (some increase in pain but she was able to tolerate it) Seated hamstring stretch 2x20 sec bilat  Standing heel raise with focus on eccentric lowering 2x10  Iso hip abduction with ball press x 10 bilateral  Side-lying clamshell 2x10 bilat TA activation + SLR x 10 bilateral  Prone hamstring curl 2x10 bilat Prone quad stretch with strap 2x30  sec bilateral  Side-lying IT band self mobilization on foam roller x1 min bilat Calf stretch on rockerboard 2 x 30    08/02/2023: Nustep  level 5 x6 min with PT present to discuss status Seated hamstring stretch 2x20 sec bilat Seated rocker board for DF/PF x1 min Standing rocker board for DF/PF x1 min Standing heel raise with focus on eccentric lowering 2x10 Side-lying IT band self mobilization on foam roller x1 min bilat Long sitting self mobilization roller on calf and quads Prone hamstring curl 2x10 bilat Prone quad stretch with strap x20 sec Side-lying clamshell 2x10 bilat Seated LAQ with hip adduction ball squeeze 2x10 bilat    PATIENT EDUCATION:  Education details: PT eval findings, anticipated POC, initial HEP, and discussion of foam rollers   Person educated: Patient Education method: Explanation, Demonstration, Tactile cues, Verbal cues, and Handouts Education comprehension: verbalized understanding and returned demonstration  HOME EXERCISE PROGRAM: Access Code: [email or web address] URL: [email or web address] Date: 08/02/2023 Prepared by: Jarrell Menke  Exercises - Gastroc Stretch on Wall  - 2 x daily - 7 x weekly - 1 sets - 3 reps - 30-60 sec hold - Heel Toe Raises with Counter Support  - 1 x daily - 7 x weekly - 2 sets - 10 reps - Seated Hamstring Stretch  - 1 x daily - 7 x weekly - 2 reps - 20 sec hold - Quadriceps Mobilization with Foam Roll  - 1 x daily - 7 x weekly - 1 sets - 10 reps - Sidelying IT Band Foam Roll Mobilization  - 1 x daily - 7 x weekly - 1 sets - 10 reps - Clamshell  - 1 x daily - 7 x weekly - 2 sets - 10 reps - Active Straight Leg Raise with Quad Set  - 1 x daily - 7 x weekly - 2 sets - 10 reps - Prone Knee Flexion  - 1 x daily - 7 x weekly - 2 sets - 10 reps - Prone Quadriceps Stretch with Strap  - 1 x daily - 7 x weekly - 2 reps - 20 sec hold - Seated Long Arc Quad  - 1 x daily - 7 x weekly - 2 sets - 10 reps - 1 sec  hold  ASSESSMENT:  CLINICAL IMPRESSION: Pt has had a flare-up of Lt knee pain with unknown cause and rates the pain as 7-8/10.  Overall Lt calf tightness is reduced.  She required verbal cues today for neutral foot with weight shifting activity.  She had Rt>Lt knee pain with this. Patient will benefit from skilled PT to address the below impairments and improve overall function.   OBJECTIVE IMPAIRMENTS: decreased strength, increased fascial restrictions, increased muscle spasms, impaired flexibility, and pain.   ACTIVITY LIMITATIONS: sitting, standing, squatting, stairs, and locomotion level  PARTICIPATION LIMITATIONS: meal prep, cleaning, laundry, shopping, occupation, and yard work  PERSONAL FACTORS: Past/current experiences and 1-2 comorbidities: previous L knee injury and Neck/back pain are also affecting patient's functional outcome.   REHAB POTENTIAL: Good  CLINICAL DECISION MAKING: Stable/uncomplicated  EVALUATION COMPLEXITY: Low   GOALS: Goals reviewed with patient? Yes  SHORT TERM GOALS: Target date: 08/20/2023    Ind with initial HEP Baseline: Goal status: Ongoing  2.  Decreased knee pain by 25% with ADLS. Baseline:  Goal status: INITIAL   LONG TERM GOALS: Target date: 09/10/2023   Ind with advanced HEP and its progression Baseline:  Goal status: INITIAL  2. Decreased knee pain by 75% with ADLS to allow RTW. Baseline:  Goal status: INITIAL  3.  Improved LE strength allowing for full squat and split squat with correct form. Baseline:  Goal status: INITIAL  4.  Able to climb stairs multiple times per day without increased pain or fatigue. Baseline:  Goal status: INITIAL  5.  Improved LEFS to 42 points showing functional improvement Baseline: 33 / 80 = 41.3 % Goal status:INITIAL    PLAN:  PT FREQUENCY: 2x/week  PT DURATION: 6 weeks  PLANNED INTERVENTIONS: 97164- PT Re-evaluation, 97110-Therapeutic exercises, 97530- Therapeutic activity, 97112-  Neuromuscular re-education, 97535- Self Care, 02859- Manual therapy, [phone]- Aquatic Therapy, [id number]- Electrical stimulation (unattended), 97035- Ultrasound, 02966- Ionotophoresis 4mg /ml Dexamethasone , 79439 (1-2 muscles), 20561 (3+ muscles)- Dry Needling, Patient/Family education, Balance training, Stair training, Taping, Joint mobilization, Cryotherapy, and Moist heat  PLAN FOR NEXT SESSION: progress weights as tolerated; continue with foam rolling, IASTM prn to quads, calf/HS stretches, functional strengthening for LE as tolerated   Burnard Joy, PT 08/09/23 1:31 PM  [facility] [address] [place], [place] [address] Phone # [phone] Fax [phone]

## 2023-08-12 ENCOUNTER — Ambulatory Visit

## 2023-08-12 ENCOUNTER — Encounter: Payer: Self-pay | Admitting: Internal Medicine

## 2023-08-12 ENCOUNTER — Ambulatory Visit (INDEPENDENT_AMBULATORY_CARE_PROVIDER_SITE_OTHER): Admitting: Internal Medicine

## 2023-08-12 ENCOUNTER — Ambulatory Visit: Payer: Self-pay

## 2023-08-12 VITALS — BP 102/72 | HR 97 | Temp 98.3°F | Ht 62.5 in | Wt 169.2 lb

## 2023-08-12 DIAGNOSIS — M25561 Pain in right knee: Secondary | ICD-10-CM | POA: Diagnosis not present

## 2023-08-12 DIAGNOSIS — M6281 Muscle weakness (generalized): Secondary | ICD-10-CM

## 2023-08-12 DIAGNOSIS — R252 Cramp and spasm: Secondary | ICD-10-CM

## 2023-08-12 DIAGNOSIS — Z0000 Encounter for general adult medical examination without abnormal findings: Secondary | ICD-10-CM | POA: Diagnosis not present

## 2023-08-12 DIAGNOSIS — E1169 Type 2 diabetes mellitus with other specified complication: Secondary | ICD-10-CM | POA: Diagnosis not present

## 2023-08-12 DIAGNOSIS — E785 Hyperlipidemia, unspecified: Secondary | ICD-10-CM

## 2023-08-12 DIAGNOSIS — Z111 Encounter for screening for respiratory tuberculosis: Secondary | ICD-10-CM

## 2023-08-12 DIAGNOSIS — E559 Vitamin D deficiency, unspecified: Secondary | ICD-10-CM

## 2023-08-12 DIAGNOSIS — Z23 Encounter for immunization: Secondary | ICD-10-CM

## 2023-08-12 LAB — COMPREHENSIVE METABOLIC PANEL WITH GFR
ALT: 16 U/L (ref 0–35)
AST: 17 U/L (ref 0–37)
Albumin: 4.3 g/dL (ref 3.5–5.2)
Alkaline Phosphatase: 72 U/L (ref 39–117)
BUN: 5 mg/dL — ABNORMAL LOW (ref 6–23)
CO2: 28 meq/L (ref 19–32)
Calcium: 8.8 mg/dL (ref 8.4–10.5)
Chloride: 104 meq/L (ref 96–112)
Creatinine, Ser: 0.8 mg/dL (ref 0.40–1.20)
GFR: 94.93 mL/min (ref >60.00)
Glucose, Bld: 107 mg/dL — ABNORMAL HIGH (ref 70–99)
Potassium: 3.6 meq/L (ref 3.5–5.1)
Sodium: 137 meq/L (ref 135–145)
Total Bilirubin: 0.5 mg/dL (ref 0.2–1.2)
Total Protein: 7 g/dL (ref 6.0–8.3)

## 2023-08-12 LAB — LIPID PANEL
Cholesterol: 126 mg/dL (ref 0–200)
HDL: 54.9 mg/dL (ref >39.00)
LDL Cholesterol: 55 mg/dL (ref 0–99)
NonHDL: 71.58
Total CHOL/HDL Ratio: 2
Triglycerides: 85 mg/dL (ref 0.0–149.0)
VLDL: 17 mg/dL (ref 0.0–40.0)

## 2023-08-12 LAB — CBC WITH DIFFERENTIAL/PLATELET
Basophils Absolute: 0 K/uL (ref 0.0–0.1)
Basophils Relative: 0.4 % (ref 0.0–3.0)
Eosinophils Absolute: 0.1 K/uL (ref 0.0–0.7)
Eosinophils Relative: 0.8 % (ref 0.0–5.0)
HCT: 38.1 % (ref 36.0–46.0)
Hemoglobin: 12.3 g/dL (ref 12.0–15.0)
Lymphocytes Relative: 38.3 % (ref 12.0–46.0)
Lymphs Abs: 2.6 K/uL (ref 0.7–4.0)
MCHC: 32.3 g/dL (ref 30.0–36.0)
MCV: 79.6 fl (ref 78.0–100.0)
Monocytes Absolute: 0.4 K/uL (ref 0.1–1.0)
Monocytes Relative: 6.3 % (ref 3.0–12.0)
Neutro Abs: 3.7 K/uL (ref 1.4–7.7)
Neutrophils Relative %: 54.2 % (ref 43.0–77.0)
Platelets: 183 K/uL (ref 150.0–400.0)
RBC: 4.79 Mil/uL (ref 3.87–5.11)
RDW: 15.2 % (ref 11.5–15.5)
WBC: 6.9 K/uL (ref 4.0–10.5)

## 2023-08-12 LAB — HEMOGLOBIN A1C: Hgb A1c MFr Bld: 7.4 % — ABNORMAL HIGH (ref 4.6–6.5)

## 2023-08-12 LAB — VITAMIN D 25 HYDROXY (VIT D DEFICIENCY, FRACTURES): VITD: 25.51 ng/mL — ABNORMAL LOW (ref 30.00–100.00)

## 2023-08-12 LAB — TSH: TSH: 0.59 u[IU]/mL (ref 0.35–5.50)

## 2023-08-12 LAB — VITAMIN B12: Vitamin B-12: 542 pg/mL (ref 211–911)

## 2023-08-12 NOTE — Addendum Note (Signed)
 Addended by: KATHRYNE MILLMAN B on: 08/12/2023 02:14 PM   Modules accepted: Orders

## 2023-08-12 NOTE — Progress Notes (Signed)
 Established Patient Office Visit     CC/Reason for Visit: Annual preventive exam  HPI: Jasmine Huerta is a 38 y.o. female who is coming in today for the above mentioned reasons. Past Medical History is significant for: Type 2 diabetes and hyperlipidemia.  She is followed by endocrinology.  She is scheduled for eyes and dental exams.  Requesting HPV vaccine.  Has follow-up coming up with GYN.  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   Past Medical/Surgical History: Past Medical History:  Diagnosis Date   Anxiety    Arthritis    My Dr said I do , I dont know.   Bipolar disorder [facility]) age 47   Depression    GDM (gestational diabetes mellitus)    history of GDM   GERD (gastroesophageal reflux disease)    Headache    migraines - 07/02/17-  none recently   Pre-diabetes     Past Surgical History:  Procedure Laterality Date   CHOLECYSTECTOMY N/A 07/05/2017   Procedure: LAPAROSCOPIC CHOLECYSTECTOMY;  Surgeon: Signe Mitzie LABOR, MD;  Location: [facility] OR;  Service: General;  Laterality: N/A;   TUBAL LIGATION  09/17/2011   Procedure: POST PARTUM TUBAL LIGATION;  Surgeon: Jon CINDERELLA Rummer, MD;  Location: [facility] ORS;  Service: Gynecology;  Laterality: Bilateral;   WISDOM TOOTH EXTRACTION      Social History:  reports that she has never smoked. She has never used smokeless tobacco. She reports that she does not drink alcohol and does not use drugs.  Allergies: No Known Allergies  Family History:  Family History  Problem Relation Age of Onset   Heart disease Sister        whole in heart at birth   Anemia Mother    Hypertension Mother    Diabetes Mother    Thyroid disease Mother    Hypertension Maternal Grandfather    Anemia Daughter    Anemia Sister    Asthma Sister    Diabetes Maternal Uncle    Diabetes Maternal Grandmother    Thyroid disease Cousin    Seizures Sister      Current Outpatient Medications:    [START ON 08/19/2023]  amphetamine -dextroamphetamine  (ADDERALL) 20 MG tablet, Take 1 tablet (20 mg total) by mouth daily., Disp: 30 tablet, Rfl: 0   amphetamine -dextroamphetamine  (ADDERALL) 20 MG tablet, Take 1 tablet (20 mg total) by mouth daily., Disp: 30 tablet, Rfl: 0   busPIRone  (BUSPAR ) 7.5 MG tablet, Take 1 tablet (7.5 mg total) by mouth 2 (two) times daily., Disp: 60 tablet, Rfl: 3   cetirizine  (ZYRTEC ) 10 MG tablet, Take 1 tablet (10 mg total) by mouth daily as needed for allergies., Disp: 90 tablet, Rfl: 1   cholecalciferol (VITAMIN D3) 25 MCG (1000 UNIT) tablet, Take 1,000 Units by mouth daily., Disp: , Rfl:    Continuous Glucose Sensor (FREESTYLE LIBRE 3 PLUS SENSOR) MISC, Use 1 sensor every 14 days., Disp: 2 each, Rfl: 11   Continuous Glucose Sensor (FREESTYLE LIBRE 3 SENSOR) MISC, Inject 1 each into the skin every 14 (fourteen) days., Disp: 2 each, Rfl: 11   dicyclomine  (BENTYL ) 20 MG tablet, TAKE 1 TABLET (20 MG TOTAL) BY MOUTH 3 (THREE) TIMES DAILY AS NEEDED FOR SPASMS., Disp: 270 tablet, Rfl: 1   meloxicam  (MOBIC ) 7.5 MG tablet, Take 1 tablet (7.5 mg total) by mouth daily., Disp: 30 tablet, Rfl: 0   metFORMIN  (GLUCOPHAGE -XR) 500 MG 24 hr tablet, Take 1 tablet (500 mg total) by mouth  every evening meal., Disp: 90 tablet, Rfl: 3   pantoprazole  (PROTONIX ) 40 MG tablet, Take 1 tablet (40 mg total) by mouth daily., Disp: 90 tablet, Rfl: 1   rosuvastatin  (CRESTOR ) 5 MG tablet, Take 1 tablet (5 mg total) by mouth daily., Disp: 90 tablet, Rfl: 1   Semaglutide ,0.25 or 0.5MG /DOS, (OZEMPIC , 0.25 OR 0.5 MG/DOSE,) 2 MG/3ML SOPN, Inject 0.25 mg into the skin once a week., Disp: 3 mL, Rfl: 3   citalopram  (CELEXA ) 10 MG tablet, Take 1 tablet (10 mg total) by mouth daily for 14 days, THEN 2 tablets (20 mg total) daily for 14 days., Disp: 42 tablet, Rfl: 0   Dulaglutide  (TRULICITY ) 0.75 MG/0.5ML SOAJ, Inject 0.75 mg into the skin once a week., Disp: 2 mL, Rfl: 1   escitalopram  (LEXAPRO ) 10 MG tablet, Take 1 tablet (10 mg  total) by mouth at bedtime., Disp: 30 tablet, Rfl: 3  Review of Systems:  Negative unless indicated in HPI.   Physical Exam: Vitals:   08/12/23 1335  BP: 102/72  Pulse: 97  Temp: 98.3 F (36.8 C)  SpO2: 100%  Weight: 169 lb 3.2 oz (76.7 kg)  Height: 5' 2.5 (1.588 m)    Body mass index is 30.45 kg/m.   Physical Exam Vitals reviewed.  Constitutional:      General: She is not in acute distress.    Appearance: Normal appearance. She is not ill-appearing, toxic-appearing or diaphoretic.  HENT:     Head: Normocephalic.     Right Ear: Tympanic membrane, ear canal and external ear normal. There is no impacted cerumen.     Left Ear: Tympanic membrane, ear canal and external ear normal. There is no impacted cerumen.     Nose: Nose normal.     Mouth/Throat:     Mouth: Mucous membranes are moist.     Pharynx: Oropharynx is clear. No oropharyngeal exudate or posterior oropharyngeal erythema.  Eyes:     General: No scleral icterus.       Right eye: No discharge.        Left eye: No discharge.     Conjunctiva/sclera: Conjunctivae normal.     Pupils: Pupils are equal, round, and reactive to light.  Neck:     Vascular: No carotid bruit.  Cardiovascular:     Rate and Rhythm: Normal rate and regular rhythm.     Pulses: Normal pulses.     Heart sounds: Normal heart sounds.  Pulmonary:     Effort: Pulmonary effort is normal. No respiratory distress.     Breath sounds: Normal breath sounds.  Abdominal:     General: Abdomen is flat. Bowel sounds are normal.     Palpations: Abdomen is soft.  Musculoskeletal:        General: Normal range of motion.     Cervical back: Normal range of motion.  Skin:    General: Skin is warm and dry.  Neurological:     General: No focal deficit present.     Mental Status: She is alert and oriented to person, place, and time. Mental status is at baseline.  Psychiatric:        Mood and Affect: Mood normal.        Behavior: Behavior normal.         Thought Content: Thought content normal.        Judgment: Judgment normal.      Impression and Plan:  Encounter for preventive health examination  Type 2 diabetes mellitus with other specified complication,  without long-term current use of insulin (HCC) -     CBC with Differential/Platelet; Future -     Hemoglobin A1c; Future -     Comprehensive metabolic panel with GFR; Future  Hyperlipidemia associated with type 2 diabetes mellitus (HCC) -     Lipid panel; Future -     TSH; Future  Vitamin D  deficiency -     Vitamin B12; Future -     VITAMIN D  25 Hydroxy (Vit-D Deficiency, Fractures); Future  Immunization due   -Recommend routine eye and dental care. -Healthy lifestyle discussed in detail. -Labs to be updated today. -Prostate cancer screening: Not applicable Health Maintenance  Topic Date Due   Yearly kidney health urinalysis for diabetes  Never done   Hepatitis C Screening  Never done   Pneumococcal Vaccination (1 of 2 - PCV) Never done   Hepatitis B Vaccine (1 of 3 - 19+ 3-dose series) Never done   HPV Vaccine (1 - 3-dose SCDM series) Never done   COVID-19 Vaccine (2 - 2024-25 season) 10/04/2022   Eye exam for diabetics  04/17/2023   Pap with HPV screening  06/13/2023   Yearly kidney function blood test for diabetes  06/16/2023   Flu Shot  09/03/2023   Hemoglobin A1C  12/26/2023   Complete foot exam   08/11/2024   DTaP/Tdap/Td vaccine (3 - Td or Tdap) 12/20/2030   HIV Screening  Completed   Meningitis B Vaccine  Aged Out     -First HPV administered today.   Tully Theophilus Andrews, MD [facility]

## 2023-08-12 NOTE — Therapy (Signed)
 OUTPATIENT PHYSICAL THERAPY TREATMENT NOTE   Patient Name: Jasmine Huerta MRN: [id number] DOB:08/20/1985, 38 y.o., female Today's Date: 08/12/2023  END OF SESSION:  PT End of Session - 08/12/23 1259     Visit Number 5    Date for PT Re-Evaluation 09/10/23    Authorization Type work comp    Authorization - Visit Number 5    Authorization - Number of Visits 13    PT Start Time 1230    PT Stop Time 1313    PT Time Calculation (min) 43 min    Activity Tolerance Patient tolerated treatment well    Behavior During Therapy WFL for tasks assessed/performed             Past Medical History:  Diagnosis Date   Anxiety    Arthritis    My Dr said I do , I dont know.   Bipolar disorder [facility]) age 47   Depression    GDM (gestational diabetes mellitus)    history of GDM   GERD (gastroesophageal reflux disease)    Headache    migraines - 07/02/17-  none recently   Pre-diabetes    Past Surgical History:  Procedure Laterality Date   CHOLECYSTECTOMY N/A 07/05/2017   Procedure: LAPAROSCOPIC CHOLECYSTECTOMY;  Surgeon: Signe Mitzie LABOR, MD;  Location: [facility] OR;  Service: General;  Laterality: N/A;   TUBAL LIGATION  09/17/2011   Procedure: POST PARTUM TUBAL LIGATION;  Surgeon: Jon CINDERELLA Rummer, MD;  Location: [facility];  Service: Gynecology;  Laterality: Bilateral;   WISDOM TOOTH EXTRACTION     Patient Active Problem List   Diagnosis Date Noted   Bipolar disorder (HCC) 02/25/2023   Increased frequency of urination 02/25/2023   Cervical radiculitis 02/25/2023   Hyperlipidemia associated with type 2 diabetes mellitus (HCC) 12/03/2021   Vitamin D  deficiency 12/03/2021   DM (diabetes mellitus), type 2 (HCC) 06/07/2018   NSVD (normal spontaneous vaginal delivery) 09/16/2011   Noncompliance with medications 08/25/2011   Noncompliance of patient with dietary regimen 08/25/2011   GDM (gestational diabetes mellitus) 08/25/2011   Gestational diabetes 07/16/2011   H/O gestational diabetes in prior  pregnancy, currently pregnant 06/10/2011    PCP: Theophilus Andrews, Tully CINDERELLA, MD   REFERRING PROVIDER: Yvone Rush, MD   REFERRING DIAG: B patellofemoral pain syndrome  THERAPY DIAG:  Pain in both knees, unspecified chronicity  Muscle weakness (generalized)  Cramp and spasm  Rationale for Evaluation and Treatment: Rehabilitation  ONSET DATE: 06/19/23  SUBJECTIVE:   SUBJECTIVE STATEMENT: 2 days ago, my knees really hurt.  I had to wrap it and my pain was 10/10  PERTINENT HISTORY: DM, ADHD, neck pain  From Eval:  Was on light duty for her neck/shoulder. She slipped on water  and hurt both knees. L> R. Right only about 1/10 and hurts with overdoing things. She has pulling in the back of the leg on the left with knee extension. Like cleaning out my pantry for 2 hours the other day and then the pain was really intense. Previously hurt L knee on 11/18/22 when a patient fell and her leg got caught up in the w/c. Did have back pain immediately after fall, but not longer hurting. Going up the stairs is worse. Crunching and needs to take a break - fatigue type of hurt. Just the knees, not thighs.  PAIN: 08/12/23 Are you having pain? Yes: NPRS scale: 7/10 Pain location: L hip to ankle Pain description: intermittent sharp, stabbing Aggravating factors: overactivity, sometimes random Relieving factors: ice and  elevation, Mobic , go to bed  PRECAUTIONS: None  RED FLAGS: None   WEIGHT BEARING RESTRICTIONS: No  FALLS:  Has patient fallen in last 6 months? No  LIVING ENVIRONMENT: Lives with: lives with their spouse Lives in: House/apartment Stairs: Yes: Internal: 13 steps; on left going up and External: 1 steps; none Has following equipment at home: None  OCCUPATION: Nurse tech  PLOF: Independent  PATIENT GOALS: clear to be full duty so she can go back to work  NEXT MD VISIT: 08/26/23  OBJECTIVE:  Note: Objective measures were completed at Evaluation unless otherwise  noted.  DIAGNOSTIC FINDINGS: XR - mild OA   PATIENT SURVEYS:  Eval:  LEFS  33 / 80 = 41.3 %   COGNITION: Overall cognitive status: Within functional limits for tasks assessed     MUSCLE LENGTH: Tight HS B, gastroc  POSTURE: increased lumbar lordosis and hyperextension of B knees  PALPATION: unremarkable  LOWER EXTREMITY ROM:  Active ROM Right eval Left eval  Hip flexion 5 5  Hip extension 5 5  Hip abduction 5 5  Hip adduction    Hip internal rotation    Hip external rotation    Knee flexion 4+ 5  Knee extension 4+ 5  Ankle dorsiflexion 5 5  Ankle plantarflexion    Ankle inversion    Ankle eversion     (Blank rows = not tested)  LOWER EXTREMITY MMT:  MMT Right eval Left eval  Knee flexion 133 133  Knee extension 0 0   (Blank rows = not tested)   FUNCTIONAL TESTS:  Eval: Squats - cues for correct form - demos some hip ADDuction Split squat - weak R>L SLS x 10 sec B                                                                                                                               TREATMENT DATE:    08/12/2023: Recumbent Bike Level 2- 5 mins PT present to discuss status  Seated hamstring stretch 2x20 sec bilat Standing rockerboard x 3 minutes Weight shifting 3 ways on balance pad 1 min each-verbal cues for neutral foot  Standing heel raise with focus on eccentric lowering 2x10- on balance pad Sit to stand: 5# kettlebell 2x10 Short arc quads: 5 2x10- added 2#  Side-lying clamshell 2x10 bilat-added yellow loop TA activation + SLR x 10 bilateral  Prone hamstring curl 2x10 bilat- added 2# ankle weights    08/09/2023: Recumbent Bike Level 2- 5 mins PT present to discuss status (some increase in pain but she was able to tolerate it) Seated hamstring stretch 2x20 sec bilat Standing rockerboard x 3 minutes Weight shifting 3 ways on balance pad 1 min each-verbal cues for neutral foot  Standing heel raise with focus on eccentric lowering  2x10 Short arc quads: 5 2x10  Side-lying clamshell 2x10 bilat-  TA activation + SLR x 10 bilateral  Prone hamstring curl 2x10 bilat Prone quad  stretch with strap 2x30 sec bilateral   08/05/2023: Recumbent Bike Level 2- 5 mins PT present to discuss status (some increase in pain but she was able to tolerate it) Seated hamstring stretch 2x20 sec bilat  Standing heel raise with focus on eccentric lowering 2x10  Iso hip abduction with ball press x 10 bilateral  Side-lying clamshell 2x10 bilat TA activation + SLR x 10 bilateral  Prone hamstring curl 2x10 bilat Prone quad stretch with strap 2x30 sec bilateral  Side-lying IT band self mobilization on foam roller x1 min bilat Calf stretch on rockerboard 2 x 30    PATIENT EDUCATION:  Education details: PT eval findings, anticipated POC, initial HEP, and discussion of foam rollers   Person educated: Patient Education method: Explanation, Demonstration, Tactile cues, Verbal cues, and Handouts Education comprehension: verbalized understanding and returned demonstration  HOME EXERCISE PROGRAM: Access Code: [email or web address] URL: [email or web address] Date: 08/02/2023 Prepared by: Jarrell Menke  Exercises - Gastroc Stretch on Wall  - 2 x daily - 7 x weekly - 1 sets - 3 reps - 30-60 sec hold - Heel Toe Raises with Counter Support  - 1 x daily - 7 x weekly - 2 sets - 10 reps - Seated Hamstring Stretch  - 1 x daily - 7 x weekly - 2 reps - 20 sec hold - Quadriceps Mobilization with Foam Roll  - 1 x daily - 7 x weekly - 1 sets - 10 reps - Sidelying IT Band Foam Roll Mobilization  - 1 x daily - 7 x weekly - 1 sets - 10 reps - Clamshell  - 1 x daily - 7 x weekly - 2 sets - 10 reps - Active Straight Leg Raise with Quad Set  - 1 x daily - 7 x weekly - 2 sets - 10 reps - Prone Knee Flexion  - 1 x daily - 7 x weekly - 2 sets - 10 reps - Prone Quadriceps Stretch with Strap  - 1 x daily - 7 x weekly - 2 reps - 20 sec hold - Seated Long Arc Quad  -  1 x daily - 7 x weekly - 2 sets - 10 reps - 1 sec hold  ASSESSMENT:  CLINICAL IMPRESSION: Pt reports 10/10 bil knee pain 2 days ago with unknown cause.   Pt was able to participate in advancement of exercises without increased pain today.  PT monitored for pain and technique. Patient will benefit from skilled PT to address the below impairments and improve overall function.   OBJECTIVE IMPAIRMENTS: decreased strength, increased fascial restrictions, increased muscle spasms, impaired flexibility, and pain.   ACTIVITY LIMITATIONS: sitting, standing, squatting, stairs, and locomotion level  PARTICIPATION LIMITATIONS: meal prep, cleaning, laundry, shopping, occupation, and yard work  PERSONAL FACTORS: Past/current experiences and 1-2 comorbidities: previous L knee injury and Neck/back pain are also affecting patient's functional outcome.   REHAB POTENTIAL: Good  CLINICAL DECISION MAKING: Stable/uncomplicated  EVALUATION COMPLEXITY: Low   GOALS: Goals reviewed with patient? Yes  SHORT TERM GOALS: Target date: 08/20/2023    Ind with initial HEP Baseline: Goal status: MET (08/12/23)  2.  Decreased knee pain by 25% with ADLS. Baseline:  no change- 10/10 on 08/10/23 Goal status: In progress    LONG TERM GOALS: Target date: 09/10/2023   Ind with advanced HEP and its progression Baseline:  Goal status: INITIAL  2. Decreased knee pain by 75% with ADLS to allow RTW. Baseline:  Goal status: INITIAL  3.  Improved LE strength allowing for  full squat and split squat with correct form. Baseline:  Goal status: INITIAL  4.  Able to climb stairs multiple times per day without increased pain or fatigue. Baseline:  Goal status: INITIAL  5.  Improved LEFS to 42 points showing functional improvement Baseline: 33 / 80 = 41.3 % Goal status:INITIAL    PLAN:  PT FREQUENCY: 2x/week  PT DURATION: 6 weeks  PLANNED INTERVENTIONS: 97164- PT Re-evaluation, 97110-Therapeutic exercises, 97530-  Therapeutic activity, 97112- Neuromuscular re-education, 97535- Self Care, 02859- Manual therapy, [id number]- Aquatic Therapy, [id number]- Electrical stimulation (unattended), 97035- Ultrasound, [id number]- Ionotophoresis 4mg /ml Dexamethasone , 79439 (1-2 muscles), 20561 (3+ muscles)- Dry Needling, Patient/Family education, Balance training, Stair training, Taping, Joint mobilization, Cryotherapy, and Moist heat  PLAN FOR NEXT SESSION: progress weights as tolerated; continue with foam rolling, IASTM prn to quads, calf/HS stretches, functional strengthening for LE as tolerated   Burnard Joy, PT 08/12/23 1:16 PM  [facility] [address] [place], [place] [address] Phone # [phone] Fax [phone]

## 2023-08-16 ENCOUNTER — Other Ambulatory Visit (HOSPITAL_BASED_OUTPATIENT_CLINIC_OR_DEPARTMENT_OTHER): Payer: Self-pay

## 2023-08-16 ENCOUNTER — Ambulatory Visit: Payer: Self-pay | Admitting: Internal Medicine

## 2023-08-16 ENCOUNTER — Other Ambulatory Visit: Payer: Self-pay | Admitting: Internal Medicine

## 2023-08-16 DIAGNOSIS — E559 Vitamin D deficiency, unspecified: Secondary | ICD-10-CM

## 2023-08-16 LAB — QUANTIFERON-TB GOLD PLUS
Mitogen-NIL: 8.85 [IU]/mL
NIL: 0.07 [IU]/mL
QuantiFERON-TB Gold Plus: NEGATIVE
TB1-NIL: <0 [IU]/mL
TB2-NIL: <0 [IU]/mL

## 2023-08-16 MED ORDER — VITAMIN D (ERGOCALCIFEROL) 1.25 MG (50000 UNIT) PO CAPS
50000.0000 [IU] | ORAL_CAPSULE | ORAL | 0 refills | Status: AC
  Filled 2023-08-16: qty 12, 84d supply, fill #0

## 2023-08-17 ENCOUNTER — Ambulatory Visit

## 2023-08-17 DIAGNOSIS — R252 Cramp and spasm: Secondary | ICD-10-CM

## 2023-08-17 DIAGNOSIS — M6281 Muscle weakness (generalized): Secondary | ICD-10-CM

## 2023-08-17 DIAGNOSIS — M25561 Pain in right knee: Secondary | ICD-10-CM | POA: Diagnosis not present

## 2023-08-17 DIAGNOSIS — M25562 Pain in left knee: Secondary | ICD-10-CM

## 2023-08-17 NOTE — Therapy (Signed)
 OUTPATIENT PHYSICAL THERAPY TREATMENT NOTE   Patient Name: Jasmine Huerta MRN: [id number] DOB:1985/02/25, 38 y.o., female Today's Date: 08/17/2023  END OF SESSION:  PT End of Session - 08/17/23 0802     Visit Number 6    Date for PT Re-Evaluation 09/10/23    Authorization Type work comp    Authorization - Visit Number 6    Authorization - Number of Visits 13    PT Start Time 0721    PT Stop Time 0802    PT Time Calculation (min) 41 min    Activity Tolerance Patient tolerated treatment well    Behavior During Therapy WFL for tasks assessed/performed              Past Medical History:  Diagnosis Date   Anxiety    Arthritis    My Dr said I do , I dont know.   Bipolar disorder [facility]) age 6   Depression    GDM (gestational diabetes mellitus)    history of GDM   GERD (gastroesophageal reflux disease)    Headache    migraines - 07/02/17-  none recently   Pre-diabetes    Past Surgical History:  Procedure Laterality Date   CHOLECYSTECTOMY N/A 07/05/2017   Procedure: LAPAROSCOPIC CHOLECYSTECTOMY;  Surgeon: Signe Mitzie LABOR, MD;  Location: [facility] OR;  Service: General;  Laterality: N/A;   TUBAL LIGATION  09/17/2011   Procedure: POST PARTUM TUBAL LIGATION;  Surgeon: Jon CINDERELLA Rummer, MD;  Location: [facility];  Service: Gynecology;  Laterality: Bilateral;   WISDOM TOOTH EXTRACTION     Patient Active Problem List   Diagnosis Date Noted   Bipolar disorder (HCC) 02/25/2023   Increased frequency of urination 02/25/2023   Cervical radiculitis 02/25/2023   Hyperlipidemia associated with type 2 diabetes mellitus (HCC) 12/03/2021   Vitamin D  deficiency 12/03/2021   DM (diabetes mellitus), type 2 (HCC) 06/07/2018   NSVD (normal spontaneous vaginal delivery) 09/16/2011   Noncompliance with medications 08/25/2011   Noncompliance of patient with dietary regimen 08/25/2011   GDM (gestational diabetes mellitus) 08/25/2011   Gestational diabetes 07/16/2011   H/O gestational diabetes in prior  pregnancy, currently pregnant 06/10/2011    PCP: Theophilus Andrews, Tully CINDERELLA, MD   REFERRING PROVIDER: Yvone Rush, MD   REFERRING DIAG: B patellofemoral pain syndrome  THERAPY DIAG:  Pain in both knees, unspecified chronicity  Muscle weakness (generalized)  Cramp and spasm  Rationale for Evaluation and Treatment: Rehabilitation  ONSET DATE: 06/19/23  SUBJECTIVE:   SUBJECTIVE STATEMENT: I am feeling better.  I had to do nothing and rest.  Pain is a 6/10 now.  I don't think that I am getting better.    PERTINENT HISTORY: DM, ADHD, neck pain  From Eval:  Was on light duty for her neck/shoulder. She slipped on water  and hurt both knees. L> R. Right only about 1/10 and hurts with overdoing things. She has pulling in the back of the leg on the left with knee extension. Like cleaning out my pantry for 2 hours the other day and then the pain was really intense. Previously hurt L knee on 11/18/22 when a patient fell and her leg got caught up in the w/c. Did have back pain immediately after fall, but not longer hurting. Going up the stairs is worse. Crunching and needs to take a break - fatigue type of hurt. Just the knees, not thighs.  PAIN: 08/17/23 Are you having pain? Yes: NPRS scale: 4/10 Pain location: L hip to ankle Pain description:  intermittent sharp, stabbing Aggravating factors: overactivity, sometimes random Relieving factors: ice and elevation, Mobic , go to bed  PRECAUTIONS: None  RED FLAGS: None   WEIGHT BEARING RESTRICTIONS: No  FALLS:  Has patient fallen in last 6 months? No  LIVING ENVIRONMENT: Lives with: lives with their spouse Lives in: House/apartment Stairs: Yes: Internal: 13 steps; on left going up and External: 1 steps; none Has following equipment at home: None  OCCUPATION: Nurse tech  PLOF: Independent  PATIENT GOALS: clear to be full duty so she can go back to work  NEXT MD VISIT: 08/26/23  OBJECTIVE:  Note: Objective measures were  completed at Evaluation unless otherwise noted.  DIAGNOSTIC FINDINGS: XR - mild OA   PATIENT SURVEYS:  Eval:  LEFS  33 / 80 = 41.3 %   COGNITION: Overall cognitive status: Within functional limits for tasks assessed     MUSCLE LENGTH: Tight HS B, gastroc  POSTURE: increased lumbar lordosis and hyperextension of B knees  PALPATION: unremarkable  LOWER EXTREMITY ROM:  Active ROM Right eval Left eval  Hip flexion 5 5  Hip extension 5 5  Hip abduction 5 5  Hip adduction    Hip internal rotation    Hip external rotation    Knee flexion 4+ 5  Knee extension 4+ 5  Ankle dorsiflexion 5 5  Ankle plantarflexion    Ankle inversion    Ankle eversion     (Blank rows = not tested)  LOWER EXTREMITY MMT:  MMT Right eval Left eval  Knee flexion 133 133  Knee extension 0 0   (Blank rows = not tested)   FUNCTIONAL TESTS:  Eval: Squats - cues for correct form - demos some hip ADDuction Split squat - weak R>L SLS x 10 sec B                                                                                                                               TREATMENT DATE:   08/17/2023: Recumbent Bike Level 4- 5 mins PT present to discuss status  Leg press: seat 6, 50# bil 2x10 bil, 30# single  Seated hamstring stretch 2x20 sec bilat Standing rockerboard x 3 minutes Weight shifting 3 ways on balance pad 1 min each-verbal cues for neutral foot  Standing heel raise with focus on eccentric lowering 2x10- on balance pad Sit to stand: 5# kettlebell 2x10 Sidestepping at barre: yellow loop x10 Short arc quads: 2# 2x10 bil  Prone hamstring curl 2x10 bilat- added 2# ankle weights   08/12/2023: Recumbent Bike Level 2- 5 mins PT present to discuss status  Seated hamstring stretch 2x20 sec bilat Standing rockerboard x 3 minutes Weight shifting 3 ways on balance pad 1 min each-verbal cues for neutral foot  Standing heel raise with focus on eccentric lowering 2x10- on balance pad Sit to  stand: 5# kettlebell 2x10 Short arc quads: 5 2x10- added 2#  Side-lying clamshell 2x10 bilat-added yellow loop TA  activation + SLR x 10 bilateral  Prone hamstring curl 2x10 bilat- added 2# ankle weights    08/09/2023: Recumbent Bike Level 2- 5 mins PT present to discuss status (some increase in pain but she was able to tolerate it) Seated hamstring stretch 2x20 sec bilat Standing rockerboard x 3 minutes Weight shifting 3 ways on balance pad 1 min each-verbal cues for neutral foot  Standing heel raise with focus on eccentric lowering 2x10 Short arc quads: 5 2x10  Side-lying clamshell 2x10 bilat-  TA activation + SLR x 10 bilateral  Prone hamstring curl 2x10 bilat Prone quad stretch with strap 2x30 sec bilateral    PATIENT EDUCATION:  Education details: PT eval findings, anticipated POC, initial HEP, and discussion of foam rollers   Person educated: Patient Education method: Explanation, Demonstration, Tactile cues, Verbal cues, and Handouts Education comprehension: verbalized understanding and returned demonstration  HOME EXERCISE PROGRAM: Access Code: [email or web address] URL: [email or web address] Date: 08/02/2023 Prepared by: Jarrell Menke  Exercises - Gastroc Stretch on Wall  - 2 x daily - 7 x weekly - 1 sets - 3 reps - 30-60 sec hold - Heel Toe Raises with Counter Support  - 1 x daily - 7 x weekly - 2 sets - 10 reps - Seated Hamstring Stretch  - 1 x daily - 7 x weekly - 2 reps - 20 sec hold - Quadriceps Mobilization with Foam Roll  - 1 x daily - 7 x weekly - 1 sets - 10 reps - Sidelying IT Band Foam Roll Mobilization  - 1 x daily - 7 x weekly - 1 sets - 10 reps - Clamshell  - 1 x daily - 7 x weekly - 2 sets - 10 reps - Active Straight Leg Raise with Quad Set  - 1 x daily - 7 x weekly - 2 sets - 10 reps - Prone Knee Flexion  - 1 x daily - 7 x weekly - 2 sets - 10 reps - Prone Quadriceps Stretch with Strap  - 1 x daily - 7 x weekly - 2 reps - 20 sec hold - Seated Long  Arc Quad  - 1 x daily - 7 x weekly - 2 sets - 10 reps - 1 sec hold  ASSESSMENT:  CLINICAL IMPRESSION: Pain levels in bil knees Lt>Rt continues to fluctuate.  She reports no change in pain levels, but stiffness has improved somewhat.  Pt was able to tolerate all activities in the clinic without increased pain or limitation. PT provided verbal and tactile cues for foot and hip/knee alignment.   PT monitored for pain and technique. Patient will benefit from skilled PT to address the below impairments and improve overall function.   OBJECTIVE IMPAIRMENTS: decreased strength, increased fascial restrictions, increased muscle spasms, impaired flexibility, and pain.   ACTIVITY LIMITATIONS: sitting, standing, squatting, stairs, and locomotion level  PARTICIPATION LIMITATIONS: meal prep, cleaning, laundry, shopping, occupation, and yard work  PERSONAL FACTORS: Past/current experiences and 1-2 comorbidities: previous L knee injury and Neck/back pain are also affecting patient's functional outcome.   REHAB POTENTIAL: Good  CLINICAL DECISION MAKING: Stable/uncomplicated  EVALUATION COMPLEXITY: Low   GOALS: Goals reviewed with patient? Yes  SHORT TERM GOALS: Target date: 08/20/2023    Ind with initial HEP Baseline: Goal status: MET (08/12/23)  2.  Decreased knee pain by 25% with ADLS. Baseline:  no change- pain is the same, stiffness is improved (08/16/13) Goal status: In progress    LONG TERM GOALS: Target date: 09/10/2023   Ind with  advanced HEP and its progression Baseline:  Goal status: INITIAL  2. Decreased knee pain by 75% with ADLS to allow RTW. Baseline:  Goal status: INITIAL  3.  Improved LE strength allowing for full squat and split squat with correct form. Baseline:  Goal status: INITIAL  4.  Able to climb stairs multiple times per day without increased pain or fatigue. Baseline:  Goal status: INITIAL  5.  Improved LEFS to 42 points showing functional  improvement Baseline: 33 / 80 = 41.3 % Goal status:INITIAL    PLAN:  PT FREQUENCY: 2x/week  PT DURATION: 6 weeks  PLANNED INTERVENTIONS: 97164- PT Re-evaluation, 97110-Therapeutic exercises, 97530- Therapeutic activity, 97112- Neuromuscular re-education, 97535- Self Care, 02859- Manual therapy, [phone]- Aquatic Therapy, G0283- Electrical stimulation (unattended), 97035- Ultrasound, 02966- Ionotophoresis 4mg /ml Dexamethasone , 79439 (1-2 muscles), 20561 (3+ muscles)- Dry Needling, Patient/Family education, Balance training, Stair training, Taping, Joint mobilization, Cryotherapy, and Moist heat  PLAN FOR NEXT SESSION: progress weights as tolerated; continue with foam rolling, IASTM prn to quads, calf/HS stretches, functional strengthening for LE as tolerated   Burnard Joy, PT 08/17/23 8:05 AM   [facility] [address] [place], [place] [address] Phone # [phone] Fax [phone]

## 2023-08-19 ENCOUNTER — Other Ambulatory Visit (HOSPITAL_BASED_OUTPATIENT_CLINIC_OR_DEPARTMENT_OTHER): Payer: Self-pay

## 2023-08-19 ENCOUNTER — Ambulatory Visit: Payer: Self-pay

## 2023-08-19 DIAGNOSIS — M25561 Pain in right knee: Secondary | ICD-10-CM | POA: Diagnosis not present

## 2023-08-19 DIAGNOSIS — M6281 Muscle weakness (generalized): Secondary | ICD-10-CM

## 2023-08-19 DIAGNOSIS — R252 Cramp and spasm: Secondary | ICD-10-CM

## 2023-08-19 NOTE — Therapy (Signed)
 OUTPATIENT PHYSICAL THERAPY TREATMENT NOTE   Patient Name: Jasmine Huerta MRN: [id number] DOB:[birth year], 38 y.o., female Today's Date: 08/19/2023  END OF SESSION:  PT End of Session - 08/19/23 0821     Visit Number 7    Date for PT Re-Evaluation 09/10/23    Authorization Type work comp    Authorization - Visit Number 7    Authorization - Number of Visits 13    PT Start Time 0801    PT Stop Time 0817    PT Time Calculation (min) 16 min    Activity Tolerance Other (comment)   pt was not feeling well so left early   Behavior During Therapy WFL for tasks assessed/performed               Past Medical History:  Diagnosis Date   Anxiety    Arthritis    My Dr said I do , I dont know.   Bipolar disorder [facility]) age 43   Depression    GDM (gestational diabetes mellitus)    history of GDM   GERD (gastroesophageal reflux disease)    Headache    migraines - 07/02/17-  none recently   Pre-diabetes    Past Surgical History:  Procedure Laterality Date   CHOLECYSTECTOMY N/A 07/05/2017   Procedure: LAPAROSCOPIC CHOLECYSTECTOMY;  Surgeon: Signe Mitzie LABOR, MD;  Location: [facility] OR;  Service: General;  Laterality: N/A;   TUBAL LIGATION  09/17/2011   Procedure: POST PARTUM TUBAL LIGATION;  Surgeon: Jon CINDERELLA Rummer, MD;  Location: [facility];  Service: Gynecology;  Laterality: Bilateral;   WISDOM TOOTH EXTRACTION     Patient Active Problem List   Diagnosis Date Noted   Bipolar disorder (HCC) 02/25/2023   Increased frequency of urination 02/25/2023   Cervical radiculitis 02/25/2023   Hyperlipidemia associated with type 2 diabetes mellitus (HCC) 12/03/2021   Vitamin D  deficiency 12/03/2021   DM (diabetes mellitus), type 2 (HCC) 06/07/2018   NSVD (normal spontaneous vaginal delivery) 09/16/2011   Noncompliance with medications 08/25/2011   Noncompliance of patient with dietary regimen 08/25/2011   GDM (gestational diabetes mellitus) 08/25/2011   Gestational diabetes 07/16/2011   H/O  gestational diabetes in prior pregnancy, currently pregnant 06/10/2011    PCP: Theophilus Andrews, Tully CINDERELLA, MD   REFERRING PROVIDER: Yvone Rush, MD   REFERRING DIAG: B patellofemoral pain syndrome  THERAPY DIAG:  Pain in both knees, unspecified chronicity  Muscle weakness (generalized)  Cramp and spasm  Rationale for Evaluation and Treatment: Rehabilitation  ONSET DATE: 06/19/23  SUBJECTIVE:   SUBJECTIVE STATEMENT: I was sore after last session and Rt>Lt.    PERTINENT HISTORY: DM, ADHD, neck pain  From Eval:  Was on light duty for her neck/shoulder. She slipped on water  and hurt both knees. L> R. Right only about 1/10 and hurts with overdoing things. She has pulling in the back of the leg on the left with knee extension. Like cleaning out my pantry for 2 hours the other day and then the pain was really intense. Previously hurt L knee on 11/18/22 when a patient fell and her leg got caught up in the w/c. Did have back pain immediately after fall, but not longer hurting. Going up the stairs is worse. Crunching and needs to take a break - fatigue type of hurt. Just the knees, not thighs.  PAIN: 08/19/23 Are you having pain? Yes: NPRS scale: 5/10 Pain location: L hip to ankle Pain description: intermittent sharp, stabbing Aggravating factors: overactivity, sometimes random Relieving factors: ice  and elevation, Mobic , go to bed  PRECAUTIONS: None  RED FLAGS: None   WEIGHT BEARING RESTRICTIONS: No  FALLS:  Has patient fallen in last 6 months? No  LIVING ENVIRONMENT: Lives with: lives with their spouse Lives in: House/apartment Stairs: Yes: Internal: 13 steps; on left going up and External: 1 steps; none Has following equipment at home: None  OCCUPATION: Nurse tech  PLOF: Independent  PATIENT GOALS: clear to be full duty so she can go back to work  NEXT MD VISIT: 08/26/23  OBJECTIVE:  Note: Objective measures were completed at Evaluation unless otherwise  noted.  DIAGNOSTIC FINDINGS: XR - mild OA   PATIENT SURVEYS:  Eval:  LEFS  33 / 80 = 41.3 %  COGNITION: Overall cognitive status: Within functional limits for tasks assessed     MUSCLE LENGTH: Tight HS B, gastroc  POSTURE: increased lumbar lordosis and hyperextension of B knees  PALPATION: unremarkable  LOWER EXTREMITY ROM:  Active ROM Right eval Left eval  Hip flexion 5 5  Hip extension 5 5  Hip abduction 5 5  Hip adduction    Hip internal rotation    Hip external rotation    Knee flexion 4+ 5  Knee extension 4+ 5  Ankle dorsiflexion 5 5  Ankle plantarflexion    Ankle inversion    Ankle eversion     (Blank rows = not tested)  LOWER EXTREMITY MMT:  MMT Right eval Left eval  Knee flexion 133 133  Knee extension 0 0   (Blank rows = not tested)   FUNCTIONAL TESTS:  Eval: Squats - cues for correct form - demos some hip ADDuction Split squat - weak R>L SLS x 10 sec B                                                                                                                               TREATMENT DATE:   08/19/2023: Recumbent Bike Level 4- 6 mins PT present to discuss status  Seated hamstring stretch 2x20 sec bilat Standing rockerboard x 3 minutes Limited treatment due to pt not feeling well and had to leave.    08/17/2023: Recumbent Bike Level 4- 5 mins PT present to discuss status  Leg press: seat 6, 50# bil 2x10 bil, 30# single  Seated hamstring stretch 2x20 sec bilat Standing rockerboard x 3 minutes Weight shifting 3 ways on balance pad 1 min each-verbal cues for neutral foot  Standing heel raise with focus on eccentric lowering 2x10- on balance pad Sit to stand: 5# kettlebell 2x10 Sidestepping at barre: yellow loop x10 Short arc quads: 2# 2x10 bil  Prone hamstring curl 2x10 bilat- added 2# ankle weights   08/12/2023: Recumbent Bike Level 2- 5 mins PT present to discuss status  Seated hamstring stretch 2x20 sec bilat Standing rockerboard x  3 minutes Weight shifting 3 ways on balance pad 1 min each-verbal cues for neutral foot  Standing heel raise with focus on  eccentric lowering 2x10- on balance pad Sit to stand: 5# kettlebell 2x10 Short arc quads: 5 2x10- added 2#  Side-lying clamshell 2x10 bilat-added yellow loop TA activation + SLR x 10 bilateral  Prone hamstring curl 2x10 bilat- added 2# ankle weights    PATIENT EDUCATION:  Education details: PT eval findings, anticipated POC, initial HEP, and discussion of foam rollers   Person educated: Patient Education method: Explanation, Demonstration, Tactile cues, Verbal cues, and Handouts Education comprehension: verbalized understanding and returned demonstration  HOME EXERCISE PROGRAM: Access Code: [email or web address] URL: [email or web address] Date: 08/02/2023 Prepared by: Jarrell Menke  Exercises - Gastroc Stretch on Wall  - 2 x daily - 7 x weekly - 1 sets - 3 reps - 30-60 sec hold - Heel Toe Raises with Counter Support  - 1 x daily - 7 x weekly - 2 sets - 10 reps - Seated Hamstring Stretch  - 1 x daily - 7 x weekly - 2 reps - 20 sec hold - Quadriceps Mobilization with Foam Roll  - 1 x daily - 7 x weekly - 1 sets - 10 reps - Sidelying IT Band Foam Roll Mobilization  - 1 x daily - 7 x weekly - 1 sets - 10 reps - Clamshell  - 1 x daily - 7 x weekly - 2 sets - 10 reps - Active Straight Leg Raise with Quad Set  - 1 x daily - 7 x weekly - 2 sets - 10 reps - Prone Knee Flexion  - 1 x daily - 7 x weekly - 2 sets - 10 reps - Prone Quadriceps Stretch with Strap  - 1 x daily - 7 x weekly - 2 reps - 20 sec hold - Seated Long Arc Quad  - 1 x daily - 7 x weekly - 2 sets - 10 reps - 1 sec hold  ASSESSMENT:  CLINICAL IMPRESSION: Pt arrived with 5/10 bil knee pain.  She had to leave early due to feeling nauseated during treatment. Will resume activity next session as able. Patient will benefit from skilled PT to address the below impairments and improve overall  function.   OBJECTIVE IMPAIRMENTS: decreased strength, increased fascial restrictions, increased muscle spasms, impaired flexibility, and pain.   ACTIVITY LIMITATIONS: sitting, standing, squatting, stairs, and locomotion level  PARTICIPATION LIMITATIONS: meal prep, cleaning, laundry, shopping, occupation, and yard work  PERSONAL FACTORS: Past/current experiences and 1-2 comorbidities: previous L knee injury and Neck/back pain are also affecting patient's functional outcome.   REHAB POTENTIAL: Good  CLINICAL DECISION MAKING: Stable/uncomplicated  EVALUATION COMPLEXITY: Low   GOALS: Goals reviewed with patient? Yes  SHORT TERM GOALS: Target date: 08/20/2023    Ind with initial HEP Baseline: Goal status: MET (08/12/23)  2.  Decreased knee pain by 25% with ADLS. Baseline:  no change- pain is the same, stiffness is improved (08/16/13) Goal status: In progress    LONG TERM GOALS: Target date: 09/10/2023   Ind with advanced HEP and its progression Baseline:  Goal status: INITIAL  2. Decreased knee pain by 75% with ADLS to allow RTW. Baseline:  Goal status: INITIAL  3.  Improved LE strength allowing for full squat and split squat with correct form. Baseline:  Goal status: INITIAL  4.  Able to climb stairs multiple times per day without increased pain or fatigue. Baseline:  Goal status: INITIAL  5.  Improved LEFS to 42 points showing functional improvement Baseline: 33 / 80 = 41.3 % Goal status:INITIAL    PLAN:  PT FREQUENCY: 2x/week  PT DURATION: 6 weeks  PLANNED INTERVENTIONS: 97164- PT Re-evaluation, 97110-Therapeutic exercises, 97530- Therapeutic activity, 97112- Neuromuscular re-education, 97535- Self Care, 02859- Manual therapy, [phone]- Aquatic Therapy, [phone]- Electrical stimulation (unattended), [phone]- Ultrasound, [id number]- Ionotophoresis 4mg /ml Dexamethasone , 79439 (1-2 muscles), 20561 (3+ muscles)- Dry Needling, Patient/Family education, Balance training, Stair  training, Taping, Joint mobilization, Cryotherapy, and Moist heat  PLAN FOR NEXT SESSION: progress weights as tolerated; continue with foam rolling, IASTM prn to quads, calf/HS stretches, functional strengthening for LE as tolerated   Burnard Joy, PT 08/19/23 8:24 AM   [facility] [address], [address] [place], [place] [address] Phone # [phone] Fax [phone]

## 2023-08-20 ENCOUNTER — Other Ambulatory Visit (HOSPITAL_BASED_OUTPATIENT_CLINIC_OR_DEPARTMENT_OTHER): Payer: Self-pay

## 2023-08-20 ENCOUNTER — Telehealth: Admitting: Physician Assistant

## 2023-08-20 DIAGNOSIS — A084 Viral intestinal infection, unspecified: Secondary | ICD-10-CM

## 2023-08-20 MED ORDER — ONDANSETRON 4 MG PO TBDP
4.0000 mg | ORAL_TABLET | Freq: Three times a day (TID) | ORAL | 0 refills | Status: AC | PRN
  Filled 2023-08-20: qty 20, 7d supply, fill #0

## 2023-08-20 NOTE — Progress Notes (Signed)
 E-Visit for Nausea and Vomiting   We are sorry that you are not feeling well. Here is how we plan to help!  Based on what you have shared with me it looks like you have a Virus that is irritating your GI tract.  Vomiting is the forceful emptying of a portion of the stomach's content through the mouth.  Although nausea and vomiting can make you feel miserable, it's important to remember that these are not diseases, but rather symptoms of an underlying illness.  When we treat short term symptoms, we always caution that any symptoms that persist should be fully evaluated in a medical office.  I have prescribed a medication that will help alleviate your symptoms and allow you to stay hydrated:  Zofran 4 mg 1 tablet every 8 hours as needed for nausea and vomiting  For your symptoms of diarrhea you may take Imodium 2 mg tablets that are over the counter at your local pharmacy. Take two tablet now and then one after each loose stool up to 6 a day.    HOME CARE: Drink clear liquids.  This is very important! Dehydration (the lack of fluid) can lead to a serious complication.  Start off with 1 tablespoon every 5 minutes for 8 hours. You may begin eating bland foods after 8 hours without vomiting.  Start with saltine crackers, white bread, rice, mashed potatoes, applesauce. After 48 hours on a bland diet, you may resume a normal diet. Try to go to sleep.  Sleep often empties the stomach and relieves the need to vomit.  GET HELP RIGHT AWAY IF:  Your symptoms do not improve or worsen within 2 days after treatment. You have a fever for over 3 days. You cannot keep down fluids after trying the medication.  MAKE SURE YOU:  Understand these instructions. Will watch your condition. Will get help right away if you are not doing well or get worse.    Thank you for choosing an e-visit.  Your e-visit answers were reviewed by a board certified advanced clinical practitioner to complete your personal care  plan. Depending upon the condition, your plan could have included both over the counter or prescription medications.  Please review your pharmacy choice. Make sure the pharmacy is open so you can pick up prescription now. If there is a problem, you may contact your provider through [employer] and have the prescription routed to another pharmacy.  Your safety is important to [place]. If you have drug allergies check your prescription carefully.   For the next 24 hours you can use MyChart to ask questions about today's visit, request a non-urgent call back, or ask for a work or school excuse. You will get an email in the next two days asking about your experience. I hope that your e-visit has been valuable and will speed your recovery.  I have spent 5 minutes in review of e-visit questionnaire, review and updating patient chart, medical decision making and response to patient.   Margaretann Loveless, PA-C

## 2023-08-24 ENCOUNTER — Ambulatory Visit

## 2023-08-24 DIAGNOSIS — R252 Cramp and spasm: Secondary | ICD-10-CM

## 2023-08-24 DIAGNOSIS — M25561 Pain in right knee: Secondary | ICD-10-CM

## 2023-08-24 DIAGNOSIS — M6281 Muscle weakness (generalized): Secondary | ICD-10-CM

## 2023-08-24 NOTE — Therapy (Signed)
 OUTPATIENT PHYSICAL THERAPY TREATMENT NOTE   Patient Name: Jasmine Huerta MRN: [id number] DOB:1985-04-01, 38 y.o., female Today's Date: 08/24/2023  END OF SESSION:  PT End of Session - 08/24/23 0807     Visit Number 8    Date for PT Re-Evaluation 09/10/23    Authorization Type work comp    Authorization - Visit Number 8    Authorization - Number of Visits 13    PT Start Time 0731    PT Stop Time 0808    PT Time Calculation (min) 37 min    Activity Tolerance Patient tolerated treatment well    Behavior During Therapy WFL for tasks assessed/performed                Past Medical History:  Diagnosis Date   Anxiety    Arthritis    My Dr said I do , I dont know.   Bipolar disorder [facility]) age 61   Depression    GDM (gestational diabetes mellitus)    history of GDM   GERD (gastroesophageal reflux disease)    Headache    migraines - 07/02/17-  none recently   Pre-diabetes    Past Surgical History:  Procedure Laterality Date   CHOLECYSTECTOMY N/A 07/05/2017   Procedure: LAPAROSCOPIC CHOLECYSTECTOMY;  Surgeon: Signe Mitzie LABOR, MD;  Location: [facility] OR;  Service: General;  Laterality: N/A;   TUBAL LIGATION  09/17/2011   Procedure: POST PARTUM TUBAL LIGATION;  Surgeon: Jon CINDERELLA Rummer, MD;  Location: [facility];  Service: Gynecology;  Laterality: Bilateral;   WISDOM TOOTH EXTRACTION     Patient Active Problem List   Diagnosis Date Noted   Bipolar disorder (HCC) 02/25/2023   Increased frequency of urination 02/25/2023   Cervical radiculitis 02/25/2023   Hyperlipidemia associated with type 2 diabetes mellitus (HCC) 12/03/2021   Vitamin D  deficiency 12/03/2021   DM (diabetes mellitus), type 2 (HCC) 06/07/2018   NSVD (normal spontaneous vaginal delivery) 09/16/2011   Noncompliance with medications 08/25/2011   Noncompliance of patient with dietary regimen 08/25/2011   GDM (gestational diabetes mellitus) 08/25/2011   Gestational diabetes 07/16/2011   H/O gestational diabetes in  prior pregnancy, currently pregnant 06/10/2011    PCP: Theophilus Andrews, Tully CINDERELLA, MD   REFERRING PROVIDER: Yvone Rush, MD   REFERRING DIAG: B patellofemoral pain syndrome  THERAPY DIAG:  Pain in both knees, unspecified chronicity  Muscle weakness (generalized)  Cramp and spasm  Rationale for Evaluation and Treatment: Rehabilitation  ONSET DATE: 06/19/23  SUBJECTIVE:   SUBJECTIVE STATEMENT: I was sick after I left here.  I did the bed/seated exercises, but not able to do much standing.  I see the MD in 2 days.  No change in pain.   PERTINENT HISTORY: DM, ADHD, neck pain  From Eval:  Was on light duty for her neck/shoulder. She slipped on water  and hurt both knees. L> R. Right only about 1/10 and hurts with overdoing things. She has pulling in the back of the leg on the left with knee extension. Like cleaning out my pantry for 2 hours the other day and then the pain was really intense. Previously hurt L knee on 11/18/22 when a patient fell and her leg got caught up in the w/c. Did have back pain immediately after fall, but not longer hurting. Going up the stairs is worse. Crunching and needs to take a break - fatigue type of hurt. Just the knees, not thighs.  PAIN: 08/24/23 Are you having pain? Yes: NPRS scale: 6/10 Pain  location: L hip to ankle Pain description: intermittent sharp, stabbing Aggravating factors: overactivity, sometimes random Relieving factors: ice and elevation, Mobic , go to bed  PRECAUTIONS: None  RED FLAGS: None   WEIGHT BEARING RESTRICTIONS: No  FALLS:  Has patient fallen in last 6 months? No  LIVING ENVIRONMENT: Lives with: lives with their spouse Lives in: House/apartment Stairs: Yes: Internal: 13 steps; on left going up and External: 1 steps; none Has following equipment at home: None  OCCUPATION: Nurse tech  PLOF: Independent  PATIENT GOALS: clear to be full duty so she can go back to work  NEXT MD VISIT: 08/26/23  OBJECTIVE:   Note: Objective measures were completed at Evaluation unless otherwise noted.  DIAGNOSTIC FINDINGS: XR - mild OA   PATIENT SURVEYS:  Eval:  LEFS  33 / 80 = 41.3 %  COGNITION: Overall cognitive status: Within functional limits for tasks assessed     MUSCLE LENGTH: Tight HS B, gastroc  POSTURE: increased lumbar lordosis and hyperextension of B knees  PALPATION: unremarkable  LOWER EXTREMITY ROM:  Active ROM Right eval Left eval  Hip flexion 5 5  Hip extension 5 5  Hip abduction 5 5  Hip adduction    Hip internal rotation    Hip external rotation    Knee flexion 4+ 5  Knee extension 4+ 5  Ankle dorsiflexion 5 5  Ankle plantarflexion    Ankle inversion    Ankle eversion     (Blank rows = not tested)  LOWER EXTREMITY MMT:  MMT Right eval Left eval  Knee flexion 133 133  Knee extension 0 0   (Blank rows = not tested)   FUNCTIONAL TESTS:  Eval: Squats - cues for correct form - demos some hip ADDuction Split squat - weak R>L SLS x 10 sec B                                                                                                                               TREATMENT DATE:   08/23/2023: Recumbent Bike Level 4- 5 mins PT present to discuss status  Leg press: seat 6, 50# bil 4x10 bil, 30# single 3x10 Seated hamstring stretch 2x20 sec bilat Standing rockerboard x 3 minutes Weight shifting 3 ways on balance pad 1 min each-verbal cues for neutral foot  Standing heel raise with focus on eccentric lowering 2x10- on balance pad Sit to stand: 5# kettlebell 2x10 Sidestepping at barre: yellow loop x10 Short arc quads: 2# 2x10 bil  Prone hamstring curl 2x10 bilat- added 2# ankle weights    08/19/2023: Recumbent Bike Level 4- 6 mins PT present to discuss status  Seated hamstring stretch 2x20 sec bilat Standing rockerboard x 3 minutes Limited treatment due to pt not feeling well and had to leave.    08/17/2023: Recumbent Bike Level 4- 5 mins PT present to  discuss status  Leg press: seat 6, 50# bil 2x10 bil, 30# single  Seated hamstring  stretch 2x20 sec bilat Standing rockerboard x 3 minutes Weight shifting 3 ways on balance pad 1 min each-verbal cues for neutral foot  Standing heel raise with focus on eccentric lowering 2x10- on balance pad Sit to stand: 5# kettlebell 2x10 Sidestepping at barre: yellow loop x10 Short arc quads: 2# 2x10 bil  Prone hamstring curl 2x10 bilat- added 2# ankle weights    PATIENT EDUCATION:  Education details: PT eval findings, anticipated POC, initial HEP, and discussion of foam rollers   Person educated: Patient Education method: Explanation, Demonstration, Tactile cues, Verbal cues, and Handouts Education comprehension: verbalized understanding and returned demonstration  HOME EXERCISE PROGRAM: Access Code: [email or web address] URL: [email or web address] Date: 08/02/2023 Prepared by: Jarrell Menke  Exercises - Gastroc Stretch on Wall  - 2 x daily - 7 x weekly - 1 sets - 3 reps - 30-60 sec hold - Heel Toe Raises with Counter Support  - 1 x daily - 7 x weekly - 2 sets - 10 reps - Seated Hamstring Stretch  - 1 x daily - 7 x weekly - 2 reps - 20 sec hold - Quadriceps Mobilization with Foam Roll  - 1 x daily - 7 x weekly - 1 sets - 10 reps - Sidelying IT Band Foam Roll Mobilization  - 1 x daily - 7 x weekly - 1 sets - 10 reps - Clamshell  - 1 x daily - 7 x weekly - 2 sets - 10 reps - Active Straight Leg Raise with Quad Set  - 1 x daily - 7 x weekly - 2 sets - 10 reps - Prone Knee Flexion  - 1 x daily - 7 x weekly - 2 sets - 10 reps - Prone Quadriceps Stretch with Strap  - 1 x daily - 7 x weekly - 2 reps - 20 sec hold - Seated Long Arc Quad  - 1 x daily - 7 x weekly - 2 sets - 10 reps - 1 sec hold  ASSESSMENT:  CLINICAL IMPRESSION: Pt denies any change in her overall knee pain.  She feels less stiffness overall.  Her pain ranges 5-6/10 in bil knees.  She has pain with steps and has to descend sideways.   She is able to participate in all exercise in clinic and increased reps today.  Patient will benefit from skilled PT to address the below impairments and improve overall function.    OBJECTIVE IMPAIRMENTS: decreased strength, increased fascial restrictions, increased muscle spasms, impaired flexibility, and pain.   ACTIVITY LIMITATIONS: sitting, standing, squatting, stairs, and locomotion level  PARTICIPATION LIMITATIONS: meal prep, cleaning, laundry, shopping, occupation, and yard work  PERSONAL FACTORS: Past/current experiences and 1-2 comorbidities: previous L knee injury and Neck/back pain are also affecting patient's functional outcome.   REHAB POTENTIAL: Good  CLINICAL DECISION MAKING: Stable/uncomplicated  EVALUATION COMPLEXITY: Low   GOALS: Goals reviewed with patient? Yes  SHORT TERM GOALS: Target date: 08/20/2023    Ind with initial HEP Baseline: Goal status: MET (08/12/23)  2.  Decreased knee pain by 25% with ADLS. Baseline:  no change- pain is the same, stiffness is improved (08/23/13) Goal status: In progress    LONG TERM GOALS: Target date: 09/10/2023   Ind with advanced HEP and its progression Baseline:  Goal status: INITIAL  2. Decreased knee pain by 75% with ADLS to allow RTW. Baseline:  Goal status: INITIAL  3.  Improved LE strength allowing for full squat and split squat with correct form. Baseline:  Goal status: INITIAL  4.  Able to climb stairs multiple times per day without increased pain or fatigue. Baseline: has to descend sideways (08/24/23) Goal status: INITIAL  5.  Improved LEFS to 42 points showing functional improvement Baseline: 33 / 80 = 41.3 % Goal status:INITIAL    PLAN:  PT FREQUENCY: 2x/week  PT DURATION: 6 weeks  PLANNED INTERVENTIONS: 97164- PT Re-evaluation, 97110-Therapeutic exercises, 97530- Therapeutic activity, 97112- Neuromuscular re-education, 97535- Self Care, 02859- Manual therapy, [id number]- Aquatic Therapy, H9716-  Electrical stimulation (unattended), [id number]- Ultrasound, 02966- Ionotophoresis 4mg /ml Dexamethasone , 79439 (1-2 muscles), 20561 (3+ muscles)- Dry Needling, Patient/Family education, Balance training, Stair training, Taping, Joint mobilization, Cryotherapy, and Moist heat  PLAN FOR NEXT SESSION: See what MD says.  Continue strength and ROM progression.    Burnard Joy, PT 08/24/23 8:10 AM   [facility] [address] [place], [place] [address] Phone # [phone] Fax [phone]

## 2023-08-25 ENCOUNTER — Ambulatory Visit: Payer: Self-pay

## 2023-08-25 DIAGNOSIS — M25561 Pain in right knee: Secondary | ICD-10-CM | POA: Diagnosis not present

## 2023-08-25 DIAGNOSIS — R252 Cramp and spasm: Secondary | ICD-10-CM

## 2023-08-25 DIAGNOSIS — M6281 Muscle weakness (generalized): Secondary | ICD-10-CM

## 2023-08-25 DIAGNOSIS — M25562 Pain in left knee: Secondary | ICD-10-CM

## 2023-08-25 NOTE — Therapy (Signed)
 OUTPATIENT PHYSICAL THERAPY TREATMENT NOTE   Patient Name: Jasmine Huerta MRN: [id number] DOB:01-Aug-1985, 38 y.o., female Today's Date: 08/25/2023  END OF SESSION:  PT End of Session - 08/25/23 1700     Visit Number 9    Date for PT Re-Evaluation 09/10/23    Authorization Type work comp    Authorization - Visit Number 9    Authorization - Number of Visits 13    PT Start Time 1617    PT Stop Time 1656    PT Time Calculation (min) 39 min    Activity Tolerance Patient tolerated treatment well    Behavior During Therapy WFL for tasks assessed/performed                 Past Medical History:  Diagnosis Date   Anxiety    Arthritis    My Dr said I do , I dont know.   Bipolar disorder [facility]) age 63   Depression    GDM (gestational diabetes mellitus)    history of GDM   GERD (gastroesophageal reflux disease)    Headache    migraines - 07/02/17-  none recently   Pre-diabetes    Past Surgical History:  Procedure Laterality Date   CHOLECYSTECTOMY N/A 07/05/2017   Procedure: LAPAROSCOPIC CHOLECYSTECTOMY;  Surgeon: Signe Mitzie LABOR, MD;  Location: [facility] OR;  Service: General;  Laterality: N/A;   TUBAL LIGATION  09/17/2011   Procedure: POST PARTUM TUBAL LIGATION;  Surgeon: Jon CINDERELLA Rummer, MD;  Location: [facility];  Service: Gynecology;  Laterality: Bilateral;   WISDOM TOOTH EXTRACTION     Patient Active Problem List   Diagnosis Date Noted   Bipolar disorder (HCC) 02/25/2023   Increased frequency of urination 02/25/2023   Cervical radiculitis 02/25/2023   Hyperlipidemia associated with type 2 diabetes mellitus (HCC) 12/03/2021   Vitamin D  deficiency 12/03/2021   DM (diabetes mellitus), type 2 (HCC) 06/07/2018   NSVD (normal spontaneous vaginal delivery) 09/16/2011   Noncompliance with medications 08/25/2011   Noncompliance of patient with dietary regimen 08/25/2011   GDM (gestational diabetes mellitus) 08/25/2011   Gestational diabetes 07/16/2011   H/O gestational diabetes in  prior pregnancy, currently pregnant 06/10/2011    PCP: Theophilus Andrews, Tully CINDERELLA, MD   REFERRING PROVIDER: Yvone Rush, MD   REFERRING DIAG: B patellofemoral pain syndrome  THERAPY DIAG:  Pain in both knees, unspecified chronicity  Muscle weakness (generalized)  Cramp and spasm  Rationale for Evaluation and Treatment: Rehabilitation  ONSET DATE: 06/19/23  SUBJECTIVE:   SUBJECTIVE STATEMENT: I see the MD tomorrow.  My pain is 6/10 constantly.  I don't know what causes the pain.   PERTINENT HISTORY: DM, ADHD, neck pain  From Eval:  Was on light duty for her neck/shoulder. She slipped on water  and hurt both knees. L> R. Right only about 1/10 and hurts with overdoing things. She has pulling in the back of the leg on the left with knee extension. Like cleaning out my pantry for 2 hours the other day and then the pain was really intense. Previously hurt L knee on 11/18/22 when a patient fell and her leg got caught up in the w/c. Did have back pain immediately after fall, but not longer hurting. Going up the stairs is worse. Crunching and needs to take a break - fatigue type of hurt. Just the knees, not thighs.  PAIN: 08/24/23 Are you having pain? Yes: NPRS scale: 6/10 Pain location: Lt>Rt Pain description: constant dull ache Aggravating factors: being in one position  too long Relieving factors: ice and elevation, wrap the leg  PRECAUTIONS: None  RED FLAGS: None   WEIGHT BEARING RESTRICTIONS: No  FALLS:  Has patient fallen in last 6 months? No  LIVING ENVIRONMENT: Lives with: lives with their spouse Lives in: House/apartment Stairs: Yes: Internal: 13 steps; on left going up and External: 1 steps; none Has following equipment at home: None  OCCUPATION: Nurse tech  PLOF: Independent  PATIENT GOALS: clear to be full duty so she can go back to work  NEXT MD VISIT: 08/26/23  OBJECTIVE:  Note: Objective measures were completed at Evaluation unless otherwise  noted.  DIAGNOSTIC FINDINGS: XR - mild OA   PATIENT SURVEYS:  Eval:  LEFS  33 / 80 = 41.3 % 08/25/23: LEFS 30/80= 37.5%  COGNITION: Overall cognitive status: Within functional limits for tasks assessed     MUSCLE LENGTH: Tight HS B, gastroc  POSTURE: increased lumbar lordosis and hyperextension of B knees  PALPATION: unremarkable  LOWER EXTREMITY ROM:  Active ROM Right eval Left eval  Hip flexion 5 5  Hip extension 5 5  Hip abduction 5 5  Hip adduction    Hip internal rotation    Hip external rotation    Knee flexion 4+ 5  Knee extension 4+ 5  Ankle dorsiflexion 5 5  Ankle plantarflexion    Ankle inversion    Ankle eversion     (Blank rows = not tested)  LOWER EXTREMITY MMT:  MMT Right eval Left eval  Knee flexion 133 133  Knee extension 0 0   (Blank rows = not tested)   FUNCTIONAL TESTS:  Eval: Squats - cues for correct form - demos some hip ADDuction Split squat - weak R>L SLS x 10 sec B                                                                                                                               TREATMENT DATE:   08/25/2023: Recumbent Bike Level 4- 5 mins PT present to discuss status  Leg press: seat 6, 50# bil 4x10 bil, 30# single 3x10 Seated hamstring stretch 2x20 sec bilat Standing rockerboard x 3 minutes Weight shifting 3 ways on balance pad 1 min each-verbal cues for neutral foot  Standing heel raise with focus on eccentric lowering Rt and Lt 2x10 each  Sit to stand: 5# kettlebell 2x10 Sidestepping at barre: yellow loop x10 Short arc quads: 2# 2x10 bil  Prone hamstring curl 2x10 bilat- added 2# ankle weights    08/23/2023: Recumbent Bike Level 4- 5 mins PT present to discuss status  Leg press: seat 6, 50# bil 4x10 bil, 30# single 3x10 Seated hamstring stretch 2x20 sec bilat Standing rockerboard x 3 minutes Weight shifting 3 ways on balance pad 1 min each-verbal cues for neutral foot  Standing heel raise with focus on  eccentric lowering 2x10- on balance pad Sit to stand: 5# kettlebell 2x10 Sidestepping at barre: yellow loop  x10 Short arc quads: 2# 2x10 bil  Prone hamstring curl 2x10 bilat- added 2# ankle weights    08/19/2023: Recumbent Bike Level 4- 6 mins PT present to discuss status  Seated hamstring stretch 2x20 sec bilat Standing rockerboard x 3 minutes Limited treatment due to pt not feeling well and had to leave.    PATIENT EDUCATION:  Education details: PT eval findings, anticipated POC, initial HEP, and discussion of foam rollers   Person educated: Patient Education method: Explanation, Demonstration, Tactile cues, Verbal cues, and Handouts Education comprehension: verbalized understanding and returned demonstration  HOME EXERCISE PROGRAM: Access Code: [email or web address] URL: [email or web address] Date: 08/02/2023 Prepared by: Jarrell Menke  Exercises - Gastroc Stretch on Wall  - 2 x daily - 7 x weekly - 1 sets - 3 reps - 30-60 sec hold - Heel Toe Raises with Counter Support  - 1 x daily - 7 x weekly - 2 sets - 10 reps - Seated Hamstring Stretch  - 1 x daily - 7 x weekly - 2 reps - 20 sec hold - Quadriceps Mobilization with Foam Roll  - 1 x daily - 7 x weekly - 1 sets - 10 reps - Sidelying IT Band Foam Roll Mobilization  - 1 x daily - 7 x weekly - 1 sets - 10 reps - Clamshell  - 1 x daily - 7 x weekly - 2 sets - 10 reps - Active Straight Leg Raise with Quad Set  - 1 x daily - 7 x weekly - 2 sets - 10 reps - Prone Knee Flexion  - 1 x daily - 7 x weekly - 2 sets - 10 reps - Prone Quadriceps Stretch with Strap  - 1 x daily - 7 x weekly - 2 reps - 20 sec hold - Seated Long Arc Quad  - 1 x daily - 7 x weekly - 2 sets - 10 reps - 1 sec hold  ASSESSMENT:  CLINICAL IMPRESSION: Pt reports 6/10 constant bil knee pain and denies any improvement since the start of care.  Unsure of what is causing pain.  LEFS is not improved today, 30/80 vs 33/80 at eval.  PT monitored throughout session  for pain, alignment and technique. Patient will benefit from skilled PT to address the below impairments and improve overall function.    OBJECTIVE IMPAIRMENTS: decreased strength, increased fascial restrictions, increased muscle spasms, impaired flexibility, and pain.   ACTIVITY LIMITATIONS: sitting, standing, squatting, stairs, and locomotion level  PARTICIPATION LIMITATIONS: meal prep, cleaning, laundry, shopping, occupation, and yard work  PERSONAL FACTORS: Past/current experiences and 1-2 comorbidities: previous L knee injury and Neck/back pain are also affecting patient's functional outcome.   REHAB POTENTIAL: Good  CLINICAL DECISION MAKING: Stable/uncomplicated  EVALUATION COMPLEXITY: Low   GOALS: Goals reviewed with patient? Yes  SHORT TERM GOALS: Target date: 08/20/2023    Ind with initial HEP Baseline: Goal status: MET (08/12/23)  2.  Decreased knee pain by 25% with ADLS. Baseline:  no change- pain is the same, stiffness is improved (08/23/13) Goal status: In progress    LONG TERM GOALS: Target date: 09/10/2023   Ind with advanced HEP and its progression Baseline:  Goal status: INITIAL  2. Decreased knee pain by 75% with ADLS to allow RTW. Baseline:  Goal status: INITIAL  3.  Improved LE strength allowing for full squat and split squat with correct form. Baseline: too much pain and compensates (08/25/23) Goal status: INITIAL  4.  Able to climb stairs multiple times per day without  increased pain or fatigue. Baseline: has to descend sideways (08/24/23) Goal status: INITIAL  5.  Improved LEFS to 42 points showing functional improvement Baseline: 33 / 80 = 41.3 % Goal status:INITIAL    PLAN:  PT FREQUENCY: 2x/week  PT DURATION: 6 weeks  PLANNED INTERVENTIONS: 97164- PT Re-evaluation, 97110-Therapeutic exercises, 97530- Therapeutic activity, 97112- Neuromuscular re-education, 97535- Self Care, 02859- Manual therapy, [id number]- Aquatic Therapy, H9716- Electrical  stimulation (unattended), [id number]- Ultrasound, [id number]- Ionotophoresis 4mg /ml Dexamethasone , 79439 (1-2 muscles), 20561 (3+ muscles)- Dry Needling, Patient/Family education, Balance training, Stair training, Taping, Joint mobilization, Cryotherapy, and Moist heat  PLAN FOR NEXT SESSION: See what MD says.  Continue strength and ROM progression.    Burnard Joy, PT 08/25/23 5:03 PM   [facility] [address] [place], [place] [address] Phone # [phone] Fax [phone]

## 2023-08-26 ENCOUNTER — Ambulatory Visit

## 2023-08-30 ENCOUNTER — Encounter: Payer: Self-pay | Admitting: Internal Medicine

## 2023-08-31 ENCOUNTER — Ambulatory Visit

## 2023-09-02 ENCOUNTER — Other Ambulatory Visit (HOSPITAL_BASED_OUTPATIENT_CLINIC_OR_DEPARTMENT_OTHER): Payer: Self-pay

## 2023-09-02 ENCOUNTER — Encounter

## 2023-09-02 MED ORDER — AMPHETAMINE-DEXTROAMPHETAMINE 20 MG PO TABS
20.0000 mg | ORAL_TABLET | Freq: Two times a day (BID) | ORAL | 0 refills | Status: AC
  Filled 2023-09-02 – 2023-09-08 (×2): qty 60, 30d supply, fill #0

## 2023-09-02 MED ORDER — AMPHETAMINE-DEXTROAMPHETAMINE 20 MG PO TABS
20.0000 mg | ORAL_TABLET | Freq: Two times a day (BID) | ORAL | 0 refills | Status: DC
  Filled 2023-09-30: qty 60, 60d supply, fill #0
  Filled 2023-10-06: qty 60, 30d supply, fill #0

## 2023-09-07 ENCOUNTER — Encounter: Admitting: Rehabilitative and Restorative Service Providers"

## 2023-09-08 ENCOUNTER — Other Ambulatory Visit (HOSPITAL_BASED_OUTPATIENT_CLINIC_OR_DEPARTMENT_OTHER): Payer: Self-pay

## 2023-09-09 ENCOUNTER — Encounter: Admitting: Rehabilitative and Restorative Service Providers"

## 2023-09-17 ENCOUNTER — Other Ambulatory Visit (HOSPITAL_BASED_OUTPATIENT_CLINIC_OR_DEPARTMENT_OTHER): Payer: Self-pay

## 2023-09-17 MED ORDER — ETODOLAC 400 MG PO TABS
400.0000 mg | ORAL_TABLET | Freq: Two times a day (BID) | ORAL | 0 refills | Status: AC
  Filled 2023-09-17: qty 60, 30d supply, fill #0

## 2023-09-30 ENCOUNTER — Other Ambulatory Visit (HOSPITAL_BASED_OUTPATIENT_CLINIC_OR_DEPARTMENT_OTHER): Payer: Self-pay

## 2023-10-06 ENCOUNTER — Other Ambulatory Visit (HOSPITAL_BASED_OUTPATIENT_CLINIC_OR_DEPARTMENT_OTHER): Payer: Self-pay

## 2023-10-08 ENCOUNTER — Other Ambulatory Visit (HOSPITAL_BASED_OUTPATIENT_CLINIC_OR_DEPARTMENT_OTHER): Payer: Self-pay

## 2023-10-08 MED ORDER — GLIPIZIDE ER 5 MG PO TB24
5.0000 mg | ORAL_TABLET | Freq: Every day | ORAL | 1 refills | Status: AC
  Filled 2023-10-08: qty 30, 30d supply, fill #0
  Filled 2023-11-02: qty 30, 30d supply, fill #1

## 2023-10-13 ENCOUNTER — Ambulatory Visit (INDEPENDENT_AMBULATORY_CARE_PROVIDER_SITE_OTHER): Admitting: *Deleted

## 2023-10-13 DIAGNOSIS — Z23 Encounter for immunization: Secondary | ICD-10-CM

## 2023-10-28 ENCOUNTER — Other Ambulatory Visit (HOSPITAL_BASED_OUTPATIENT_CLINIC_OR_DEPARTMENT_OTHER): Payer: Self-pay

## 2023-10-28 MED ORDER — AMPHETAMINE-DEXTROAMPHETAMINE 20 MG PO TABS
20.0000 mg | ORAL_TABLET | Freq: Two times a day (BID) | ORAL | 0 refills | Status: DC
  Filled 2023-11-03: qty 60, 30d supply, fill #0

## 2023-11-02 ENCOUNTER — Other Ambulatory Visit (HOSPITAL_BASED_OUTPATIENT_CLINIC_OR_DEPARTMENT_OTHER): Payer: Self-pay

## 2023-11-03 ENCOUNTER — Other Ambulatory Visit (HOSPITAL_BASED_OUTPATIENT_CLINIC_OR_DEPARTMENT_OTHER): Payer: Self-pay

## 2023-11-29 ENCOUNTER — Other Ambulatory Visit (HOSPITAL_BASED_OUTPATIENT_CLINIC_OR_DEPARTMENT_OTHER): Payer: Self-pay

## 2023-11-29 MED ORDER — AMPHETAMINE-DEXTROAMPHETAMINE 20 MG PO TABS
20.0000 mg | ORAL_TABLET | Freq: Two times a day (BID) | ORAL | 0 refills | Status: DC
  Filled 2023-11-29 – 2023-12-03 (×2): qty 60, 30d supply, fill #0

## 2023-12-03 ENCOUNTER — Other Ambulatory Visit (HOSPITAL_BASED_OUTPATIENT_CLINIC_OR_DEPARTMENT_OTHER): Payer: Self-pay

## 2023-12-27 ENCOUNTER — Other Ambulatory Visit (HOSPITAL_BASED_OUTPATIENT_CLINIC_OR_DEPARTMENT_OTHER): Payer: Self-pay

## 2023-12-27 MED ORDER — AMPHETAMINE-DEXTROAMPHETAMINE 20 MG PO TABS
20.0000 mg | ORAL_TABLET | Freq: Two times a day (BID) | ORAL | 0 refills | Status: AC
  Filled 2024-01-12: qty 60, 30d supply, fill #0

## 2023-12-27 MED ORDER — AMPHETAMINE-DEXTROAMPHETAMINE 20 MG PO TABS: 20.0000 mg | ORAL_TABLET | Freq: Two times a day (BID) | ORAL | 0 refills | Status: AC

## 2024-01-12 ENCOUNTER — Other Ambulatory Visit (HOSPITAL_BASED_OUTPATIENT_CLINIC_OR_DEPARTMENT_OTHER): Payer: Self-pay

## 2024-02-14 ENCOUNTER — Encounter (HOSPITAL_BASED_OUTPATIENT_CLINIC_OR_DEPARTMENT_OTHER): Payer: Self-pay

## 2024-02-14 ENCOUNTER — Ambulatory Visit

## 2024-02-14 ENCOUNTER — Other Ambulatory Visit (HOSPITAL_BASED_OUTPATIENT_CLINIC_OR_DEPARTMENT_OTHER): Payer: Self-pay

## 2024-02-14 MED ORDER — DULOXETINE HCL 30 MG PO CPEP
30.0000 mg | ORAL_CAPSULE | Freq: Every morning | ORAL | 1 refills | Status: AC
  Filled 2024-02-14: qty 30, 30d supply, fill #0

## 2024-02-14 MED ORDER — AMPHETAMINE-DEXTROAMPHETAMINE 20 MG PO TABS: 20.0000 mg | ORAL_TABLET | Freq: Two times a day (BID) | ORAL | 0 refills | Status: AC

## 2024-02-14 MED ORDER — AMPHETAMINE-DEXTROAMPHETAMINE 20 MG PO TABS
20.0000 mg | ORAL_TABLET | Freq: Two times a day (BID) | ORAL | 0 refills | Status: AC
  Filled 2024-02-14: qty 60, 30d supply, fill #0
